# Patient Record
Sex: Female | Born: 1955 | Race: Black or African American | Hispanic: No | Smoking: Current every day smoker
Health system: Southern US, Community
[De-identification: ages and names within clinical notes are randomized; demographics above are authoritative.]

## PROBLEM LIST (undated history)

## (undated) DIAGNOSIS — J45909 Unspecified asthma, uncomplicated: Secondary | ICD-10-CM

## (undated) DIAGNOSIS — F17200 Nicotine dependence, unspecified, uncomplicated: Secondary | ICD-10-CM

## (undated) DIAGNOSIS — D219 Benign neoplasm of connective and other soft tissue, unspecified: Secondary | ICD-10-CM

## (undated) DIAGNOSIS — Z923 Personal history of irradiation: Secondary | ICD-10-CM

## (undated) DIAGNOSIS — J449 Chronic obstructive pulmonary disease, unspecified: Secondary | ICD-10-CM

## (undated) DIAGNOSIS — E119 Type 2 diabetes mellitus without complications: Secondary | ICD-10-CM

## (undated) DIAGNOSIS — I10 Essential (primary) hypertension: Secondary | ICD-10-CM

## (undated) HISTORY — PX: TUBAL LIGATION: SHX77

---

## 2008-10-08 ENCOUNTER — Emergency Department: Payer: Self-pay | Admitting: Emergency Medicine

## 2012-01-03 ENCOUNTER — Emergency Department: Payer: Self-pay | Admitting: Internal Medicine

## 2012-01-03 LAB — CBC WITH DIFFERENTIAL/PLATELET
Basophil #: 0.1 10*3/uL (ref 0.0–0.1)
Basophil %: 1.2 %
Eosinophil %: 1.2 %
HCT: 40.7 % (ref 35.0–47.0)
HGB: 13.4 g/dL (ref 12.0–16.0)
Lymphocyte #: 2.9 10*3/uL (ref 1.0–3.6)
Lymphocyte %: 25.1 %
MCV: 91 fL (ref 80–100)
Monocyte %: 7.1 %
Neutrophil #: 7.5 10*3/uL — ABNORMAL HIGH (ref 1.4–6.5)
RDW: 14.2 % (ref 11.5–14.5)
WBC: 11.5 10*3/uL — ABNORMAL HIGH (ref 3.6–11.0)

## 2012-01-03 LAB — COMPREHENSIVE METABOLIC PANEL
Alkaline Phosphatase: 113 U/L (ref 50–136)
Anion Gap: 6 — ABNORMAL LOW (ref 7–16)
Bilirubin,Total: 0.5 mg/dL (ref 0.2–1.0)
Calcium, Total: 8.4 mg/dL — ABNORMAL LOW (ref 8.5–10.1)
Chloride: 104 mmol/L (ref 98–107)
Co2: 31 mmol/L (ref 21–32)
Creatinine: 0.94 mg/dL (ref 0.60–1.30)
EGFR (African American): >60
Glucose: 92 mg/dL (ref 65–99)
Osmolality: 281 (ref 275–301)
Potassium: 3 mmol/L — ABNORMAL LOW (ref 3.5–5.1)
Sodium: 141 mmol/L (ref 136–145)
Total Protein: 7.7 g/dL (ref 6.4–8.2)

## 2012-01-03 LAB — CK TOTAL AND CKMB (NOT AT ARMC): CK, Total: 766 U/L — ABNORMAL HIGH (ref 21–215)

## 2012-01-03 LAB — TROPONIN I: Troponin-I: <0.02 ng/mL

## 2012-01-04 LAB — TROPONIN I: Troponin-I: <0.02 ng/mL

## 2012-01-04 LAB — CK TOTAL AND CKMB (NOT AT ARMC): CK, Total: 779 U/L — ABNORMAL HIGH (ref 21–215)

## 2012-12-12 ENCOUNTER — Encounter (HOSPITAL_COMMUNITY): Payer: Self-pay | Admitting: Emergency Medicine

## 2012-12-12 ENCOUNTER — Emergency Department (HOSPITAL_COMMUNITY): Payer: Self-pay

## 2012-12-12 ENCOUNTER — Emergency Department (HOSPITAL_COMMUNITY)
Admission: EM | Admit: 2012-12-12 | Discharge: 2012-12-13 | Disposition: A | Discharge status: Home / Self Care | Payer: Self-pay | Attending: Emergency Medicine | Admitting: Emergency Medicine

## 2012-12-12 DIAGNOSIS — N390 Urinary tract infection, site not specified: Secondary | ICD-10-CM | POA: Insufficient documentation

## 2012-12-12 DIAGNOSIS — R61 Generalized hyperhidrosis: Secondary | ICD-10-CM | POA: Insufficient documentation

## 2012-12-12 DIAGNOSIS — M549 Dorsalgia, unspecified: Secondary | ICD-10-CM | POA: Insufficient documentation

## 2012-12-12 DIAGNOSIS — F17200 Nicotine dependence, unspecified, uncomplicated: Secondary | ICD-10-CM | POA: Insufficient documentation

## 2012-12-12 DIAGNOSIS — R109 Unspecified abdominal pain: Secondary | ICD-10-CM | POA: Insufficient documentation

## 2012-12-12 LAB — URINALYSIS, ROUTINE W REFLEX MICROSCOPIC
Glucose, UA: NEGATIVE mg/dL
Hgb urine dipstick: NEGATIVE
Protein, ur: NEGATIVE mg/dL
Urobilinogen, UA: 0.2 mg/dL (ref 0.0–1.0)
pH: 5 (ref 5.0–8.0)

## 2012-12-12 LAB — BASIC METABOLIC PANEL
BUN: 14 mg/dL (ref 6–23)
Calcium: 9.9 mg/dL (ref 8.4–10.5)
Chloride: 100 mEq/L (ref 96–112)
Creatinine, Ser: 0.8 mg/dL (ref 0.50–1.10)
GFR calc Af Amer: >90 mL/min (ref >90)
Potassium: 4.6 mEq/L (ref 3.5–5.1)

## 2012-12-12 LAB — CBC
HCT: 47.5 % — ABNORMAL HIGH (ref 36.0–46.0)
Hemoglobin: 15.8 g/dL — ABNORMAL HIGH (ref 12.0–15.0)
MCH: 31.7 pg (ref 26.0–34.0)
MCHC: 33.3 g/dL (ref 30.0–36.0)
MCV: 95.4 fL (ref 78.0–100.0)
RBC: 4.98 MIL/uL (ref 3.87–5.11)
RDW: 14.2 % (ref 11.5–15.5)
WBC: 8.7 10*3/uL (ref 4.0–10.5)

## 2012-12-12 LAB — URINE MICROSCOPIC-ADD ON

## 2012-12-12 LAB — POCT I-STAT TROPONIN I: Troponin i, poc: 0 ng/mL (ref 0.00–0.08)

## 2012-12-12 MED ORDER — MORPHINE SULFATE 4 MG/ML IJ SOLN
4.0000 mg | Freq: Once | INTRAMUSCULAR | Status: AC
  Administered 2012-12-12: 4 mg via INTRAVENOUS
  Filled 2012-12-12: qty 1

## 2012-12-12 MED ORDER — IOHEXOL 300 MG/ML  SOLN: 100.0000 mL | Freq: Once | INTRAMUSCULAR | Status: AC | PRN

## 2012-12-12 MED ORDER — SODIUM CHLORIDE 0.9 % IV SOLN
INTRAVENOUS | Status: DC
  Administered 2012-12-12: 22:00:00 via INTRAVENOUS

## 2012-12-12 MED ORDER — IOHEXOL 300 MG/ML  SOLN
25.0000 mL | INTRAMUSCULAR | Status: AC
  Administered 2012-12-12: 25 mL via ORAL

## 2012-12-12 MED ORDER — ONDANSETRON HCL 4 MG/2ML IJ SOLN
4.0000 mg | Freq: Once | INTRAMUSCULAR | Status: AC
  Administered 2012-12-12: 4 mg via INTRAVENOUS
  Filled 2012-12-12: qty 2

## 2012-12-12 NOTE — ED Notes (Signed)
C/o severe lower back pain since last night that now radiates up back and around to chest.  Pt diaphoretic.  Denies sob.  C/o nausea and a bad taste in her mouth.  Denies urinary complaints.

## 2012-12-12 NOTE — ED Provider Notes (Signed)
CSN: [id number]     Arrival date & time 12/12/12  1800 History   First MD Initiated Contact with Patient 12/12/12 2035     Chief Complaint  Patient presents with  . Back Pain  . Nausea  . diaphoretic    (Consider location/radiation/quality/duration/timing/severity/associated sxs/prior Treatment) Patient is a 57 y.o. female presenting with back pain. The history is provided by the patient.  Back Pain Associated symptoms: abdominal pain   Associated symptoms: no chest pain, no headaches, no numbness and no weakness    patient has had pain in her bilateral flanks and right abdomen for the last few days. It started in her lower back at work his way up and around on the right side. It is less severe on the left side and goes left superior. She's had nausea and some diaphoresis. No vomiting. She's had some urinary frequency. She states it feels like when she has had a urinary tract infection previously. No diarrhea or constipation. No chills. She's had a decreased appetite.  History reviewed. No pertinent past medical history. Past Surgical History  Procedure Laterality Date  . Tubal ligation     No family history on file. History  Substance Use Topics  . Smoking status: Current Every Day Smoker  . Smokeless tobacco: Not on file  . Alcohol Use: No   OB History   Grav Para Term Preterm Abortions TAB SAB Ect Mult Living                 Review of Systems  Constitutional: Positive for appetite change. Negative for activity change.  Eyes: Negative for pain.  Respiratory: Negative for chest tightness and shortness of breath.   Cardiovascular: Negative for chest pain and leg swelling.  Gastrointestinal: Positive for nausea and abdominal pain. Negative for vomiting and diarrhea.  Genitourinary: Positive for flank pain.  Musculoskeletal: Positive for back pain. Negative for neck stiffness.  Skin: Negative for rash.  Neurological: Negative for weakness, numbness and headaches.   Psychiatric/Behavioral: Negative for behavioral problems.    Allergies  Ibuprofen  Home Medications   Current Outpatient Rx  Name  Route  Sig  Dispense  Refill  . Doxylamine Succinate, Sleep, (SLEEP AID PO)   Oral   Take 1 tablet by mouth at bedtime as needed (for sleep).         Marland Kitchen guaiFENesin (ROBITUSSIN) 100 MG/5ML liquid   Oral   Take 200 mg by mouth 3 (three) times daily as needed for cough.         . hydrocortisone cream 1 %   Topical   Apply 1 application topically daily as needed (for rash).          BP 123/56  Pulse 81  Temp(Src) 97.8 F (36.6 C) (Oral)  Resp 20  SpO2 97% Physical Exam  Nursing note and vitals reviewed. Constitutional: She is oriented to person, place, and time. She appears well-developed and well-nourished.  Patient is obese  HENT:  Head: Normocephalic and atraumatic.  Eyes: EOM are normal. Pupils are equal, round, and reactive to light.  Neck: Normal range of motion. Neck supple.  Cardiovascular: Normal rate, regular rhythm and normal heart sounds.   No murmur heard. Pulmonary/Chest: Effort normal and breath sounds normal. No respiratory distress. She has no wheezes. She has no rales.  Abdominal: Soft. Bowel sounds are normal. She exhibits no distension. There is tenderness. There is no rebound and no guarding.  Moderate tenderness to right abdomen. No masses palpated  Genitourinary:  CVA tenderness on right and some lower back tenderness on left side.  Musculoskeletal: Normal range of motion.  Neurological: She is alert and oriented to person, place, and time. No cranial nerve deficit.  Skin: Skin is warm and dry.  Psychiatric: She has a normal mood and affect. Her speech is normal.    ED Course  Procedures (including critical care time) Labs Review Labs Reviewed  CBC - Abnormal; Notable for the following:    Hemoglobin 15.8 (*)    HCT 47.5 (*)    All other components within normal limits  BASIC METABOLIC PANEL - Abnormal;  Notable for the following:    GFR calc non Af Amer 81 (*)    All other components within normal limits  URINALYSIS, ROUTINE W REFLEX MICROSCOPIC - Abnormal; Notable for the following:    APPearance CLOUDY (*)    Leukocytes, UA LARGE (*)    All other components within normal limits  URINE MICROSCOPIC-ADD ON - Abnormal; Notable for the following:    Squamous Epithelial / LPF MANY (*)    Bacteria, UA MANY (*)    All other components within normal limits  URINE CULTURE  POCT I-STAT TROPONIN I   Imaging Review Dg Chest 2 View  12/12/2012   CLINICAL DATA:  Chest pain and shortness of breath with cough for 2 weeks.  EXAM: CHEST  2 VIEW  COMPARISON:  None.  FINDINGS: Lungs are adequately inflated without focal consolidation or effusion. Cardiomediastinal silhouette is within normal. There is subtle biphasic curvature curvature of the spine.  IMPRESSION: No acute cardiopulmonary disease.   Electronically Signed   By: Elberta Fortis M.D.   On: 12/12/2012 19:40    EKG Interpretation   None       MDM  No diagnosis found. Patient with back pain. Pain goes around to right abdomen. Patient has apparent UTI, however there is more tenderness on the abdomen. CT scan is pending at this time.    Juliet Rude. Rubin Payor, MD 12/12/12 2350

## 2012-12-12 NOTE — ED Notes (Signed)
[place], MD at bedside.  

## 2012-12-13 MED ORDER — CEPHALEXIN 500 MG PO CAPS: 500.0000 mg | ORAL_CAPSULE | Freq: Four times a day (QID) | ORAL | Status: DC

## 2012-12-13 MED ORDER — OXYCODONE-ACETAMINOPHEN 5-325 MG PO TABS: 1.0000 | ORAL_TABLET | ORAL | Status: DC | PRN

## 2012-12-13 MED ORDER — METOCLOPRAMIDE HCL 10 MG PO TABS: 10.0000 mg | ORAL_TABLET | Freq: Four times a day (QID) | ORAL | Status: DC | PRN

## 2012-12-13 MED ORDER — IOHEXOL 300 MG/ML  SOLN
100.0000 mL | Freq: Once | INTRAMUSCULAR | Status: AC | PRN
  Administered 2012-12-13: 100 mL via INTRAVENOUS

## 2012-12-13 MED ORDER — MORPHINE SULFATE 4 MG/ML IJ SOLN
4.0000 mg | Freq: Once | INTRAMUSCULAR | Status: AC
  Administered 2012-12-13: 4 mg via INTRAVENOUS
  Filled 2012-12-13: qty 1

## 2012-12-13 MED ORDER — DEXTROSE 5 % IV SOLN
1.0000 g | Freq: Once | INTRAVENOUS | Status: AC
  Administered 2012-12-13: 1 g via INTRAVENOUS
  Filled 2012-12-13: qty 10

## 2012-12-13 NOTE — ED Provider Notes (Signed)
CT has come back unremarkable. She's given a dose of ceftriaxone for her urinary tract infection and is discharged with prescriptions for cephalexin, clopamide, and oxycodone-acetaminophen.  Results for orders placed during the hospital encounter of 12/12/12  CBC      Result Value Range   WBC 8.7  4.0 - 10.5 K/uL   RBC 4.98  3.87 - 5.11 MIL/uL   Hemoglobin 15.8 (*) 12.0 - 15.0 g/dL   HCT 78.2 (*) 95.6 - 21.3 %   MCV 95.4  78.0 - 100.0 fL   MCH 31.7  26.0 - 34.0 pg   MCHC 33.3  30.0 - 36.0 g/dL   RDW 08.6  57.8 - 46.9 %   Platelets 226  150 - 400 K/uL  BASIC METABOLIC PANEL      Result Value Range   Sodium 141  135 - 145 mEq/L   Potassium 4.6  3.5 - 5.1 mEq/L   Chloride 100  96 - 112 mEq/L   CO2 31  19 - 32 mEq/L   Glucose, Bld 98  70 - 99 mg/dL   BUN 14  6 - 23 mg/dL   Creatinine, Ser 6.29  0.50 - 1.10 mg/dL   Calcium 9.9  8.4 - 52.8 mg/dL   GFR calc non Af Amer 81 (*) >90 mL/min   GFR calc Af Amer >90  >90 mL/min  URINALYSIS, ROUTINE W REFLEX MICROSCOPIC      Result Value Range   Color, Urine YELLOW  YELLOW   APPearance CLOUDY (*) CLEAR   Specific Gravity, Urine 1.028  1.005 - 1.030   pH 5.0  5.0 - 8.0   Glucose, UA NEGATIVE  NEGATIVE mg/dL   Hgb urine dipstick NEGATIVE  NEGATIVE   Bilirubin Urine NEGATIVE  NEGATIVE   Ketones, ur NEGATIVE  NEGATIVE mg/dL   Protein, ur NEGATIVE  NEGATIVE mg/dL   Urobilinogen, UA 0.2  0.0 - 1.0 mg/dL   Nitrite NEGATIVE  NEGATIVE   Leukocytes, UA LARGE (*) NEGATIVE  URINE MICROSCOPIC-ADD ON      Result Value Range   Squamous Epithelial / LPF MANY (*) RARE   WBC, UA 21-50  <3 WBC/hpf   RBC / HPF 0-2  <3 RBC/hpf   Bacteria, UA MANY (*) RARE   Urine-Other MUCOUS PRESENT    POCT I-STAT TROPONIN I      Result Value Range   Troponin i, poc 0.00  0.00 - 0.08 ng/mL   Comment 3            Dg Chest 2 View  12/12/2012   CLINICAL DATA:  Chest pain and shortness of breath with cough for 2 weeks.  EXAM: CHEST  2 VIEW  COMPARISON:  None.   FINDINGS: Lungs are adequately inflated without focal consolidation or effusion. Cardiomediastinal silhouette is within normal. There is subtle biphasic curvature curvature of the spine.  IMPRESSION: No acute cardiopulmonary disease.   Electronically Signed   By: Elberta Fortis M.D.   On: 12/12/2012 19:40   Ct Abdomen Pelvis W Contrast  12/13/2012   CLINICAL DATA:  Low back pain since last night. Diaphoresis. Nausea. Right-sided pain.  EXAM: CT ABDOMEN AND PELVIS WITH CONTRAST  TECHNIQUE: Multidetector CT imaging of the abdomen and pelvis was performed using the standard protocol following bolus administration of intravenous contrast.  CONTRAST:  OMNIPAQUE IOHEXOL 300 MG/ML  SOLN  COMPARISON:  05/14/2012  FINDINGS: Lower Chest: Motion degradation. Normal heart size without pericardial or pleural effusion. Grossly clear lungs.  Abdomen/Pelvis:  Hepatomegaly, 19.9 cm craniocaudal. Mild hepatic steatosis. Prominence of the caudate lobe. Normal spleen, stomach, pancreas, gallbladder, biliary tract, adrenal glands, kidneys. Aortic and branch vessel atherosclerosis. No retroperitoneal or retrocrural adenopathy. Normal colon, appendix, and terminal ileum. Normal small bowel without abdominal ascites.  No pelvic adenopathy. Normal urinary bladder. Fibroid uterus. The largest exophytic mass off the lower uterine segment measures 5.6 x 4.6 cm on image 76/series 2. No adnexal mass or significant free fluid. .  Bones/Musculoskeletal:  No acute osseous abnormality.  IMPRESSION: 1. Hepatomegaly and hepatic steatosis. Mild caudate lobe prominence, without specific evidence of cirrhosis. Correlate with risk factors for liver disease. 2. Otherwise, no explanation for low back pain. 3. Uterine fibroids.   Electronically Signed   By: Jeronimo Greaves M.D.   On: 12/13/2012 00:40      Dione Booze, MD 12/13/12 [phone]

## 2012-12-14 LAB — URINE CULTURE: Culture: NO GROWTH

## 2013-08-21 ENCOUNTER — Encounter (HOSPITAL_COMMUNITY): Payer: Self-pay | Admitting: Emergency Medicine

## 2013-08-21 ENCOUNTER — Emergency Department (HOSPITAL_COMMUNITY)
Admission: EM | Admit: 2013-08-21 | Discharge: 2013-08-21 | Disposition: A | Discharge status: Home / Self Care | Payer: Self-pay | Attending: Emergency Medicine | Admitting: Emergency Medicine

## 2013-08-21 ENCOUNTER — Emergency Department (HOSPITAL_COMMUNITY): Payer: Self-pay

## 2013-08-21 DIAGNOSIS — F17200 Nicotine dependence, unspecified, uncomplicated: Secondary | ICD-10-CM | POA: Insufficient documentation

## 2013-08-21 DIAGNOSIS — R3589 Other polyuria: Secondary | ICD-10-CM | POA: Insufficient documentation

## 2013-08-21 DIAGNOSIS — Z9851 Tubal ligation status: Secondary | ICD-10-CM | POA: Insufficient documentation

## 2013-08-21 DIAGNOSIS — R197 Diarrhea, unspecified: Secondary | ICD-10-CM | POA: Insufficient documentation

## 2013-08-21 DIAGNOSIS — R358 Other polyuria: Secondary | ICD-10-CM | POA: Insufficient documentation

## 2013-08-21 DIAGNOSIS — R0602 Shortness of breath: Secondary | ICD-10-CM | POA: Insufficient documentation

## 2013-08-21 DIAGNOSIS — R110 Nausea: Secondary | ICD-10-CM | POA: Insufficient documentation

## 2013-08-21 DIAGNOSIS — Z8541 Personal history of malignant neoplasm of cervix uteri: Secondary | ICD-10-CM | POA: Insufficient documentation

## 2013-08-21 DIAGNOSIS — R1084 Generalized abdominal pain: Secondary | ICD-10-CM | POA: Insufficient documentation

## 2013-08-21 HISTORY — DX: Benign neoplasm of connective and other soft tissue, unspecified: D21.9

## 2013-08-21 LAB — I-STAT TROPONIN, ED: Troponin i, poc: 0 ng/mL (ref 0.00–0.08)

## 2013-08-21 LAB — COMPREHENSIVE METABOLIC PANEL
ALBUMIN: 3.8 g/dL (ref 3.5–5.2)
ALK PHOS: 85 U/L (ref 39–117)
ALT: 19 U/L (ref 0–35)
AST: 18 U/L (ref 0–37)
Anion gap: 15 (ref 5–15)
BUN: 18 mg/dL (ref 6–23)
CO2: 27 mEq/L (ref 19–32)
Calcium: 9.3 mg/dL (ref 8.4–10.5)
Chloride: 100 mEq/L (ref 96–112)
Creatinine, Ser: 0.81 mg/dL (ref 0.50–1.10)
GFR calc non Af Amer: 80 mL/min — ABNORMAL LOW (ref >90)
GLUCOSE: 119 mg/dL — AB (ref 70–99)
POTASSIUM: 3.9 meq/L (ref 3.7–5.3)
Sodium: 142 mEq/L (ref 137–147)
TOTAL PROTEIN: 7.5 g/dL (ref 6.0–8.3)
Total Bilirubin: 0.5 mg/dL (ref 0.3–1.2)

## 2013-08-21 LAB — CBC WITH DIFFERENTIAL/PLATELET
Basophils Absolute: 0.1 10*3/uL (ref 0.0–0.1)
Basophils Relative: 1 % (ref 0–1)
EOS ABS: 0.1 10*3/uL (ref 0.0–0.7)
Eosinophils Relative: 2 % (ref 0–5)
HEMATOCRIT: 45.3 % (ref 36.0–46.0)
HEMOGLOBIN: 14.7 g/dL (ref 12.0–15.0)
Lymphocytes Relative: 42 % (ref 12–46)
Lymphs Abs: 2.8 10*3/uL (ref 0.7–4.0)
MCH: 30.6 pg (ref 26.0–34.0)
MCHC: 32.5 g/dL (ref 30.0–36.0)
MCV: 94.4 fL (ref 78.0–100.0)
MONO ABS: 0.4 10*3/uL (ref 0.1–1.0)
MONOS PCT: 6 % (ref 3–12)
Neutro Abs: 3.4 10*3/uL (ref 1.7–7.7)
Neutrophils Relative %: 49 % (ref 43–77)
Platelets: 228 10*3/uL (ref 150–400)
RBC: 4.8 MIL/uL (ref 3.87–5.11)
RDW: 14.4 % (ref 11.5–15.5)
WBC: 6.7 10*3/uL (ref 4.0–10.5)

## 2013-08-21 LAB — URINALYSIS, ROUTINE W REFLEX MICROSCOPIC
BILIRUBIN URINE: NEGATIVE
GLUCOSE, UA: NEGATIVE mg/dL
HGB URINE DIPSTICK: NEGATIVE
Ketones, ur: 15 mg/dL — AB
Nitrite: NEGATIVE
PROTEIN: NEGATIVE mg/dL
Specific Gravity, Urine: 1.027 (ref 1.005–1.030)
Urobilinogen, UA: 1 mg/dL (ref 0.0–1.0)
pH: 5.5 (ref 5.0–8.0)

## 2013-08-21 LAB — URINE MICROSCOPIC-ADD ON

## 2013-08-21 LAB — LIPASE, BLOOD: Lipase: 26 U/L (ref 11–59)

## 2013-08-21 LAB — PRO B NATRIURETIC PEPTIDE: Pro B Natriuretic peptide (BNP): 23.4 pg/mL (ref 0–125)

## 2013-08-21 MED ORDER — SODIUM CHLORIDE 0.9 % IV SOLN
Freq: Once | INTRAVENOUS | Status: AC
  Administered 2013-08-21: 21:00:00 via INTRAVENOUS

## 2013-08-21 MED ORDER — ONDANSETRON 4 MG PO TBDP
8.0000 mg | ORAL_TABLET | Freq: Once | ORAL | Status: AC
  Administered 2013-08-21: 8 mg via ORAL
  Filled 2013-08-21: qty 2

## 2013-08-21 MED ORDER — HYDROMORPHONE HCL PF 1 MG/ML IJ SOLN
1.0000 mg | Freq: Once | INTRAMUSCULAR | Status: AC
  Administered 2013-08-21: 1 mg via INTRAVENOUS
  Filled 2013-08-21: qty 1

## 2013-08-21 NOTE — ED Notes (Signed)
Pt is here with abdominal pain and abdominal numbness, RUQ pain.  Pt states chest pain and sob.

## 2013-08-21 NOTE — ED Notes (Signed)
Pt reports she is feeling nauseated and hot.

## 2013-08-21 NOTE — ED Notes (Signed)
Pt placed on 2L via [place]; oxygen sats on RA at 82%; pt at 97% on 2L 

## 2013-08-21 NOTE — ED Notes (Signed)
Radiology called to transport pt to room.

## 2013-08-21 NOTE — ED Provider Notes (Signed)
CSN: [id number]     Arrival date & time 08/21/13  1812 History   First MD Initiated Contact with Patient 08/21/13 2027     Chief Complaint  Patient presents with  . Chest Pain  . Abdominal Pain     (Consider location/radiation/quality/duration/timing/severity/associated sxs/prior Treatment) Patient is a 58 y.o. female presenting with chest pain and abdominal pain.  Chest Pain Associated symptoms: abdominal pain, nausea and shortness of breath   Associated symptoms: no diaphoresis, no fever and not vomiting   Abdominal Pain Associated symptoms: chest pain, diarrhea, nausea and shortness of breath   Associated symptoms: no chills, no constipation, no dysuria, no fever and no vomiting     Caitlin Rivera is a 58 year old woman with history of uterine fibroids who presents with 2 weeks of abdominal pain. She says that the throbbing pain originates suprapubic and radiates up both sides of her abdomen and to her back bilaterally. She thinks the part that is most painful shifts daily and today is RUQ. She thinks that resting makes it better and is not sure what else makes it better or worse. She has some nausea but no emesis. She had diarrhea x 3 days earlier this week. She reports her last cocaine use was 3 weeks ago. She notes polyuria but denies dysuria. She has no PCP.  Past Medical History  Diagnosis Date  . Fibroids    Past Surgical History  Procedure Laterality Date  . Tubal ligation    . Tubal ligation     No family history on file. History  Substance Use Topics  . Smoking status: Current Every Day Smoker  . Smokeless tobacco: Not on file  . Alcohol Use: No   OB History   Grav Para Term Preterm Abortions TAB SAB Ect Mult Living                 Review of Systems  Constitutional: Negative for fever, chills, diaphoresis and unexpected weight change.  Respiratory: Positive for shortness of breath.   Cardiovascular: Positive for chest pain.  Gastrointestinal: Positive for  nausea, abdominal pain and diarrhea. Negative for vomiting, constipation and blood in stool.  Endocrine: Positive for polyuria.  Genitourinary: Negative for dysuria.      Allergies  Ibuprofen  Home Medications   Prior to Admission medications   Medication Sig Start Date End Date Taking? Authorizing Provider  OVER THE COUNTER MEDICATION Take 1-2 tablets by mouth 2 (two) times daily as needed (for pain). OTC Pain Reliever PM   Yes Historical Provider, MD   BP 119/70  Pulse 71  Temp(Src) 98.1 F (36.7 C) (Oral)  Resp 10  SpO2 97% Physical Exam  Constitutional: She is oriented to person, place, and time. She appears well-developed and well-nourished. She appears distressed.  Restless shuffling in bed, cannot get comfortable  HENT:  Mouth/Throat: Oropharynx is clear and moist.  Eyes: EOM are normal. Pupils are equal, round, and reactive to light. No scleral icterus.  Cardiovascular: Normal rate, regular rhythm, normal heart sounds and intact distal pulses.  Exam reveals no gallop and no friction rub.   No murmur heard. Pulmonary/Chest: Effort normal and breath sounds normal. No respiratory distress.  Abdominal: Soft. Bowel sounds are normal. She exhibits no distension. There is tenderness.  Diffuse tenderness in all quadrants and suprapubic  Musculoskeletal: She exhibits no edema and no tenderness.  Neurological: She is alert and oriented to person, place, and time.  Skin: She is not diaphoretic.    ED  Course  Procedures (including critical care time) Labs Review Labs Reviewed  COMPREHENSIVE METABOLIC PANEL - Abnormal; Notable for the following:    Glucose, Bld 119 (*)    GFR calc non Af Amer 80 (*)    All other components within normal limits  URINALYSIS, ROUTINE W REFLEX MICROSCOPIC - Abnormal; Notable for the following:    APPearance CLOUDY (*)    Ketones, ur 15 (*)    Leukocytes, UA SMALL (*)    All other components within normal limits  URINE MICROSCOPIC-ADD ON -  Abnormal; Notable for the following:    Squamous Epithelial / LPF FEW (*)    Bacteria, UA FEW (*)    Casts HYALINE CASTS (*)    Crystals CA OXALATE CRYSTALS (*)    All other components within normal limits  PRO B NATRIURETIC PEPTIDE  CBC WITH DIFFERENTIAL  LIPASE, BLOOD  URINE RAPID DRUG SCREEN (HOSP PERFORMED)  I-STAT TROPOININ, ED    Imaging Review Dg Chest 2 View  08/21/2013   CLINICAL DATA:  Chest pain and numbness in the epigastric region.  EXAM: CHEST  2 VIEW  COMPARISON:  Chest x-ray 12/12/2012.  FINDINGS: Mild diffuse peribronchial cuffing, similar to the prior examination. Lung volumes are normal. No consolidative airspace disease. No pleural effusions. No pneumothorax. No pulmonary nodule or mass noted. Pulmonary vasculature and the cardiomediastinal silhouette are within normal limits.  IMPRESSION: 1. Mild diffuse peribronchial cuffing similar to the prior study. In this patient with history of smoking, this may reflect chronic bronchitis.   Electronically Signed   By: Vinnie Langton M.D.   On: 08/21/2013 19:51   Ct Renal Stone Study  08/21/2013   CLINICAL DATA:  Abdominal pain and numbness. Chest pain and shortness of breath.  EXAM: CT RENAL STONE PROTOCOL  TECHNIQUE: Multidetector CT imaging of the abdomen and pelvis was performed following the standard protocol without intravenous contrast  COMPARISON:  CT of the abdomen and pelvis performed 12/12/2012  FINDINGS: The visualized lung bases are clear. A single tiny bleb is noted at the right lung base.  There is mild diffuse fatty infiltration within the liver, with mild sparing about the gallbladder fossa. The spleen is unremarkable in appearance. The gallbladder is within normal limits. The pancreas and adrenal glands are unremarkable.  The kidneys are unremarkable in appearance. There is no evidence of hydronephrosis. No renal or ureteral stones are seen. No perinephric stranding is appreciated.  No free fluid is identified. The  small bowel is unremarkable in appearance. The stomach is within normal limits. No acute vascular abnormalities are seen. Mild scattered calcification is seen along the abdominal aorta and its branches.  The appendix is normal in caliber and contains air, without evidence for appendicitis. The colon is unremarkable in appearance.  The bladder is mildly distended and grossly unremarkable. Three prominent exophytic fibroids arising from the inferior aspect of the uterus are grossly stable in appearance, the largest of which measures 5.6 x 4.6 cm in size. The ovaries are relatively symmetric. No suspicious adnexal masses are seen. No inguinal lymphadenopathy is seen.  No acute osseous abnormalities are identified.  IMPRESSION: 1. No evidence of hydronephrosis.  No renal or ureteral stone seen. 2. The appendix is unremarkable in appearance; no evidence for appendicitis. 3. Stable appearance to three prominent exophytic fibroids arising from the uterus. No evidence for degeneration or torsion. 4. Mild diffuse fatty infiltration within the liver. 5. Mild scattered calcification along the abdominal aorta and its branches.   Electronically  Signed   By: Garald Balding M.D.   On: 08/21/2013 22:25     EKG Interpretation   Date/Time:  Wednesday August 21 2013 18:16:34 EDT Ventricular Rate:  76 PR Interval:  152 QRS Duration: 80 QT Interval:  372 QTC Calculation: 418 R Axis:   53 Text Interpretation:  Normal sinus rhythm T wave abnormality, consider  inferolateral ischemia Abnormal ECG Confirmed by BEATON  MD, ROBERT  ([id number]) on 08/21/2013 9:42:29 PM      MDM   Final diagnoses:  None    9:07PM: Patient has calcium oxalate crystals and restless pain on exam which is consistent with nephrolithiasis, although she had no hematuria on UA. She also has history of exophytic uterine fibroids which may cause torsion. Last reported smoked cocaine 3 weeks ago. Troponin negative but new t-wave inversions concerning  for inferolateral ischemia.  proBNP CBC, CMP, lipase wnl. Will get CT abd pelvis wo contrast, dilaudid 1 mg iv, and NS infusion @ 75 cc/hr and call cardiology consult  10:11PM: Patient chart reviewed by Dr. Murvin Natal of cardiology. He feels the changes are due to LVH and given patient history and negative troponin, not concerning or requiring admission for cardiology purposes. Awaiting CT.  10:40PM: CT negative for nephrolithiasis and has stable fibroids. Discussed results with patient and possibility this is related to fibroids. We will provide list of possible PCPs for her to establish care. We also shared that [facility] is an option for fibroid assessment and possible need for hysterectomy.  Caitlin Aline, MD 08/21/13 [phone]

## 2013-08-21 NOTE — ED Notes (Signed)
PT monitored by pulse ox, bp cuff, and 5-lead. 

## 2013-08-21 NOTE — Discharge Instructions (Signed)
You were seen in the ED today for abdominal and chest pain. You had a CT which showed no changes from last year's imaging. We discussed that you should have your fibroids evaluated as an outpatient or at [facility]. Please seek medical attention or return to the ED if you have any new or worsening abdominal pain, chest pain, shortness of breath or any other worrisome condition. You should take over the counter tylenol 650 mg every six hours for your pain if needed.   Emergency Department Resource Guide 1) Find a Doctor and Pay Out of Pocket Although you won't have to find out who is covered by your insurance plan, it is a good idea to ask around and get recommendations. You will then need to call the office and see if the doctor you have chosen will accept you as a new patient and what types of options they offer for patients who are self-pay. Some doctors offer discounts or will set up payment plans for their patients who do not have insurance, but you will need to ask so you aren't surprised when you get to your appointment.  2) Contact Your Local Health Department Not all health departments have doctors that can see patients for sick visits, but many do, so it is worth a call to see if yours does. If you don't know where your local health department is, you can check in your phone book. The CDC also has a tool to help you locate your state's health department, and many state websites also have listings of all of their local health departments.  3) Find a [facility] If your illness is not likely to be very severe or complicated, you may want to try a walk in clinic. These are popping up all over the country in pharmacies, drugstores, and shopping centers. They're usually staffed by nurse practitioners or physician assistants that have been trained to treat common illnesses and complaints. They're usually fairly quick and inexpensive. However, if you have serious medical issues or chronic  medical problems, these are probably not your best option.  No Primary Care Doctor: - Call Health Connect at  [phone] - they can help you locate a primary care doctor that  accepts your insurance, provides certain services, etc. - Physician Referral Service- [phone]  Chronic Pain Problems: Organization         Address  Phone   Notes  [facility]  [phone] Patients need to be referred by their primary care doctor.   Medication Assistance: Organization         Address  Phone   Notes  [facility] Medication [facility] [place], [place] [address] [phone] --Must be a resident of [facility] -- Must have NO insurance coverage whatsoever (no Medicaid/ Medicare, etc.) -- The pt. MUST have a primary care doctor that directs their care regularly and follows them in the community   Med[facility]ist  [phone]   [employer]  [phone]    Agencies that provide inexpensive medical care: Organization         Address  Phone   Notes  [place]  [phone]   [facility]    [phone]   [facility] [place], [place] [address] [phone]   [place] [address], [place] [phone]   Planned Parenthood    [phone]  [facility]    [phone]   [facility]  [address], [place] Phone:  [phone], Fax:  [phone] Hours of Operation:  9 am - 6 pm, M-F.  Also accepts Medicaid/Medicare and self-pay.  [facility] [place] [address], [address], [place] Phone: [phone], Fax: [phone]. Hours of Operation:  8:30 am - 5:30 pm, M-F.  Also accepts Medicaid and self-pay.  [facility] [place] [address], [place] Phone: [phone]   [place], [place], [place] [phone],  Ext. [phone] Mondays & Thursdays: 7-9 AM.  First 15 patients are seen on a first come, first serve basis.    [place] Providers:  Organization         Address  Phone   Notes  [facility] [address], [place] [phone] Also accepts self-pay patients.  [facility] [address], [place]  [phone]   [place], [address], [place] [phone]   [facility] [address] [place] [phone]   Lucianne Lei [address], [place]   [phone] Only accepts [place] Access [place] patients after they have their name applied to their card.   Self-Pay (no insurance) in [facility]:  Organization         Address  Phone   Notes  Sickle Cell Patients, [facility] [place] [phone]   [facility] [place] [phone]   [facility] [place]  [place], [place], [place] [phone]   [facility]/Dr. Osei-Bonsu  [address], [place] [phone] Phone number for both [place] and [place] locations is the same.  [facility] [address], [place] [phone]   [facility] [address] [place] or [address] [phone]   [facility] [address] [place] [phone], phone; [phone], fax Sees patients 1st and 3rd Saturday of every month.  Must not qualify for public or private insurance (i.e. Medicaid, Medicare, [place] Health Choice, Veterans' Benefits)  Household income should be no more than 200% of the poverty level The clinic cannot treat you if you are pregnant or think you are pregnant  Sexually transmitted diseases are not  treated at the clinic.    Dental Care: Organization         Address  Phone  Notes  [facility] Department of [facility] [phone] Accepts children up to age 45 who are enrolled in [place] or [place]; pregnant women with a Medicaid card; and children who have applied for Medicaid or [place] Health Choice, but were declined, whose parents can pay a reduced fee at time of service.  [facility] of [facility]  [address], [place] [phone] Accepts children up to age 59 who are enrolled in [place] or [place]; pregnant women with a Medicaid card; and children who have applied for Medicaid or [place] Health Choice, but were declined, whose parents can pay a reduced fee at time  of service.  °[facility] ° [address], [place] [phone] Patients are seen by appointment only. Walk-ins are not accepted. [facility] will see patients 18 years of age and older. °Monday - Tuesday (8am-5pm) °Most Wednesdays (8:30-5pm) °$30 per visit, cash only  °[facility] ° [address], [place] [phone] Patients are seen by appointment only. Walk-ins are not accepted. [facility] will see patients 18 years of age and older. °One Wednesday Evening (Monthly: Volunteer Based).  $30 per visit, cash only  °[facility]  [phone] for adults; Children under age 4, call Graduate Pediatric Dentistry at [phone]. Children aged 4-14, please call [phone] to request a pediatric application. ° Dental services are provided in all areas of dental care including fillings, crowns and bridges, complete and partial dentures, implants, gum treatment, root canals, and extractions. Preventive care is also provided. Treatment is provided to both adults and children. °Patients are selected via a lottery and there is  often a waiting list. °  °[facility] [address], °[place] ° ([phone] [email or web address].[email or web address] °  °[facility] [address], [place], [place] ([phone], Ext. 123 Second and Fourth Thursday of each month, opens at 6:30 AM; Clinic ends at 9 AM.  Patients are seen on a first-come first-served basis, and a limited number are seen during each clinic.  ° °[facility] ° [address], [place], [place] [phone]   Eligibility Requirements °You must have lived in [place], [place], or [place] counties for at least the last three months. °  You cannot be eligible for state or federal sponsored healthcare insurance, including Veterans Administration, Medicaid, or Medicare. °  You generally cannot be eligible for healthcare insurance through your employer.  °  How to apply: °Eligibility screenings are held every Tuesday and Wednesday afternoon from 1:00 pm until 4:00 pm. You do not need an appointment for the interview!  °[facility] [address], [place], [place] [phone]   °[facility]  [phone]   °[facility]  [phone]   °[facility]  336-570-6415   ° °Behavioral Health Resources in the Community: °Intensive Outpatient Programs °Organization         Address  Phone  Notes  °[facility] [address], [place], [place] [phone]   °[facility] [address], [place], [place] [phone]   °[facility] [address], [place], [place] ° [phone]   °[facility] [address],  °[place], [place] 1-800-853-5163 or 336-641-4981   °Substance Abuse Resources °Organization         Address  Phone  Notes  °[facility]  336-882-2125   °[facility]  336-784-9470   °[facility]  336-285-9073   °[facility]  336-845-3988   °Residential & Outpatient Substance  Abuse Program  1-800-659-3381   °Psychological Services °Organization         Address  Phone  Notes  °[facility]  336- [phone]   °[facility]  336- [phone]   °[facility] [address], [place] 1-800-853-5163 or 336-641-4981   ° °Mobile Crisis Teams °Organization         Address  Phone  Notes  °[facility], Mobile Crisis Care Unit  1-877-626-1772   °Assertive °Psychotherapeutic   Services  [address] [place], [place]   [facility] [address], [place] [phone]    Self-Help/Support Groups Organization         Address  Phone             Notes  [place]. of [place] - variety of support groups  [place] Call for more information  Narcotics Anonymous (NA), [facility] [address], [employer] [place]  2 meetings at this location   Special educational needs teacher         Address  Phone  Notes  [facility] [place],    [place]  1-6361518779   [facility]  [address] [place] [address], [place], [place]   [place], [place] [phone] Admissions: 8am-3pm M-F  Incentives Substance [place] [address]    [place], [place] [phone]   [facility] [address] [place], [place], [place]   [facility] [address],  [place], [place]   [facility] - Intensive Outpatient [address], [place], [place]   [facility] ([place].,  [place], [place] 1-(302)789-2861 or [phone]   [facility] ([facility]) [address] [place], [place] Accepts Medicaid  [facility] [address],  [place] 1-7401063853 Substance Abuse/Addiction Treatment   [facility] Organization          Address  Phone  Notes  [facility]  6098293892   Domenic Schwab, PhD [address] [place], [place]   [phone] or [phone]   [place], [place] [phone]   [facility] [address], [place], [place] [phone] Insurance/Medicaid/sponsorship through [facility] [address] [place]                                    [place], [place] [phone] [place] [address], [place] [phone]    Dr. Adele Schilder  [phone]   [facility] Dept. 1) [address] [place] 2) [place] 3)  [place] [address], [place] [phone]  [phone]   [place] [phone] or [phone] (After Hours)

## 2013-08-22 LAB — RAPID URINE DRUG SCREEN, HOSP PERFORMED
Amphetamines: NOT DETECTED
Barbiturates: NOT DETECTED
Benzodiazepines: NOT DETECTED
COCAINE: NOT DETECTED
OPIATES: NOT DETECTED
Tetrahydrocannabinol: NOT DETECTED

## 2013-08-26 NOTE — ED Provider Notes (Signed)
I saw and evaluated the patient, reviewed the resident's note and I agree with the findings and plan.   .Face to face Exam:  General:  Awake HEENT:  Atraumatic Resp:  Normal effort Abd:  Nondistended Neuro:No focal weakness  EKG was discussed and reviewed with resident  Dot Lanes, MD 08/26/13 [phone]

## 2014-03-13 ENCOUNTER — Encounter (HOSPITAL_COMMUNITY): Payer: Self-pay | Admitting: Emergency Medicine

## 2014-03-13 ENCOUNTER — Emergency Department (HOSPITAL_COMMUNITY)
Admission: EM | Admit: 2014-03-13 | Discharge: 2014-03-13 | Disposition: A | Discharge status: Home / Self Care | Payer: Self-pay | Attending: Emergency Medicine | Admitting: Emergency Medicine

## 2014-03-13 DIAGNOSIS — R05 Cough: Secondary | ICD-10-CM | POA: Insufficient documentation

## 2014-03-13 DIAGNOSIS — Z720 Tobacco use: Secondary | ICD-10-CM | POA: Insufficient documentation

## 2014-03-13 DIAGNOSIS — H6501 Acute serous otitis media, right ear: Secondary | ICD-10-CM | POA: Insufficient documentation

## 2014-03-13 DIAGNOSIS — Z8742 Personal history of other diseases of the female genital tract: Secondary | ICD-10-CM | POA: Insufficient documentation

## 2014-03-13 DIAGNOSIS — J029 Acute pharyngitis, unspecified: Secondary | ICD-10-CM | POA: Insufficient documentation

## 2014-03-13 MED ORDER — AMOXICILLIN 500 MG PO CAPS: 500.0000 mg | ORAL_CAPSULE | Freq: Three times a day (TID) | ORAL | Status: DC

## 2014-03-13 MED ORDER — TRAMADOL HCL 50 MG PO TABS: 50.0000 mg | ORAL_TABLET | Freq: Four times a day (QID) | ORAL | Status: DC | PRN

## 2014-03-13 NOTE — Discharge Instructions (Signed)

## 2014-03-13 NOTE — ED Notes (Signed)
Patient states started having ear pain about a week ago.  Patient complains of R ear pain.  Patient states her throat started hurting after that.    Patient denies other symptoms.   Patient states didn't take anything at home.

## 2014-03-13 NOTE — ED Provider Notes (Signed)
CSN: [id number]     Arrival date & time 03/13/14  1059 History  This chart was scribed for Glendell Docker, NP with Maudry Diego, MD by Edison Simon, ED Scribe. This patient was seen in room TR06C/TR06C and the patient's care was started at 11:09 AM.    No chief complaint on file.  Otalgia  There is pain in the right ear. This is a new problem. The current episode started 1 to 4 weeks ago. The problem occurs constantly. The problem has been gradually worsening. There has been no fever. The pain is mild. Associated symptoms include coughing, hearing loss and a sore throat. Pertinent negatives include no ear discharge. She has tried nothing for the symptoms. There is no history of a chronic ear infection or a tympanostomy tube.    HPI Comments: Caitlin Rivera is a 59 y.o. female who presents to the Emergency Department complaining of right ear pain and decreased hearing  with onset 1 week ago. She reports associated cough and sore throat. She states she has not used an medication for it. She states she has not had similar symptoms before. She denies any health problems but states she does not see a doctor regularly. She denies drainage.   Past Medical History  Diagnosis Date  . Fibroids    Past Surgical History  Procedure Laterality Date  . Tubal ligation    . Tubal ligation     No family history on file. History  Substance Use Topics  . Smoking status: Current Every Day Smoker  . Smokeless tobacco: Not on file  . Alcohol Use: No   OB History    No data available     Review of Systems  HENT: Positive for ear pain, hearing loss and sore throat. Negative for ear discharge.   Respiratory: Positive for cough.   All other systems reviewed and are negative.     Allergies  Ibuprofen  Home Medications   Prior to Admission medications   Medication Sig Start Date End Date Taking? Authorizing Provider  OVER THE COUNTER MEDICATION Take 1-2 tablets by mouth 2 (two) times daily as  needed (for pain). OTC Pain Reliever PM    Historical Provider, MD   BP 144/76 mmHg  Pulse 78  Temp(Src) 97.9 F (36.6 C)  Resp 18  SpO2 97% Physical Exam  Constitutional: She is oriented to person, place, and time. She appears well-developed and well-nourished.  HENT:  Head: Normocephalic and atraumatic.  Right Ear: Tympanic membrane is erythematous and bulging.  Mouth/Throat: Posterior oropharyngeal erythema present.  Eyes: Conjunctivae are normal.  Neck: Normal range of motion. Neck supple.  Cardiovascular: Normal rate and regular rhythm.   Pulmonary/Chest: Effort normal.  Musculoskeletal: Normal range of motion.  Neurological: She is alert and oriented to person, place, and time.  Skin: Skin is warm and dry.  Psychiatric: She has a normal mood and affect.  Nursing note and vitals reviewed.   ED Course  Procedures (including critical care time)  DIAGNOSTIC STUDIES: Oxygen Saturation is 97% on room air, normal by my interpretation.    COORDINATION OF CARE: 11:11 AM Discussed with patient that her examination reveals evidence of ear infection. Discussed treatment plan with patient at beside, the patient agrees with the plan and has no further questions at this time.   Labs Review Labs Reviewed - No data to display  Imaging Review No results found.   EKG Interpretation None      MDM   Final diagnoses:  Right acute serous otitis media, recurrence not specified    Will treat with amox and ultram. Pt given return precautions.  I personally performed the services described in this documentation, which was scribed in my presence. The recorded information has been reviewed and is accurate.   Glendell Docker, NP 03/13/14 [place], MD 03/13/14 1242

## 2015-03-15 ENCOUNTER — Emergency Department (HOSPITAL_COMMUNITY)
Admission: EM | Admit: 2015-03-15 | Discharge: 2015-03-15 | Disposition: A | Discharge status: Home / Self Care | Payer: Self-pay | Attending: Emergency Medicine | Admitting: Emergency Medicine

## 2015-03-15 ENCOUNTER — Emergency Department (HOSPITAL_COMMUNITY): Payer: Self-pay

## 2015-03-15 ENCOUNTER — Encounter (HOSPITAL_COMMUNITY): Payer: Self-pay | Admitting: *Deleted

## 2015-03-15 DIAGNOSIS — K089 Disorder of teeth and supporting structures, unspecified: Secondary | ICD-10-CM

## 2015-03-15 DIAGNOSIS — Z792 Long term (current) use of antibiotics: Secondary | ICD-10-CM | POA: Insufficient documentation

## 2015-03-15 DIAGNOSIS — K0501 Acute gingivitis, non-plaque induced: Secondary | ICD-10-CM | POA: Insufficient documentation

## 2015-03-15 DIAGNOSIS — M5410 Radiculopathy, site unspecified: Secondary | ICD-10-CM

## 2015-03-15 DIAGNOSIS — M5416 Radiculopathy, lumbar region: Secondary | ICD-10-CM | POA: Insufficient documentation

## 2015-03-15 DIAGNOSIS — K0500 Acute gingivitis, plaque induced: Secondary | ICD-10-CM

## 2015-03-15 DIAGNOSIS — F17210 Nicotine dependence, cigarettes, uncomplicated: Secondary | ICD-10-CM | POA: Insufficient documentation

## 2015-03-15 DIAGNOSIS — R200 Anesthesia of skin: Secondary | ICD-10-CM | POA: Insufficient documentation

## 2015-03-15 DIAGNOSIS — Z86018 Personal history of other benign neoplasm: Secondary | ICD-10-CM | POA: Insufficient documentation

## 2015-03-15 LAB — BASIC METABOLIC PANEL
ANION GAP: 11 (ref 5–15)
BUN: 6 mg/dL (ref 6–20)
CALCIUM: 9.3 mg/dL (ref 8.9–10.3)
CO2: 28 mmol/L (ref 22–32)
Chloride: 100 mmol/L — ABNORMAL LOW (ref 101–111)
Creatinine, Ser: 0.72 mg/dL (ref 0.44–1.00)
GFR calc Af Amer: >60 mL/min (ref >60)
GLUCOSE: 122 mg/dL — AB (ref 65–99)
Potassium: 3.8 mmol/L (ref 3.5–5.1)
Sodium: 139 mmol/L (ref 135–145)

## 2015-03-15 LAB — BRAIN NATRIURETIC PEPTIDE: B Natriuretic Peptide: 53.5 pg/mL (ref 0.0–100.0)

## 2015-03-15 LAB — CBC
HEMATOCRIT: 46.5 % — AB (ref 36.0–46.0)
Hemoglobin: 15.4 g/dL — ABNORMAL HIGH (ref 12.0–15.0)
MCH: 31 pg (ref 26.0–34.0)
MCHC: 33.1 g/dL (ref 30.0–36.0)
MCV: 93.6 fL (ref 78.0–100.0)
Platelets: 216 10*3/uL (ref 150–400)
RBC: 4.97 MIL/uL (ref 3.87–5.11)
RDW: 14.2 % (ref 11.5–15.5)
WBC: 6.4 10*3/uL (ref 4.0–10.5)

## 2015-03-15 LAB — I-STAT TROPONIN, ED: Troponin i, poc: 0 ng/mL (ref 0.00–0.08)

## 2015-03-15 MED ORDER — ACETAMINOPHEN 325 MG PO TABS
325.0000 mg | ORAL_TABLET | Freq: Once | ORAL | Status: DC
  Filled 2015-03-15: qty 1

## 2015-03-15 MED ORDER — CHLORHEXIDINE GLUCONATE 0.12% ORAL RINSE (MEDLINE KIT): 15.0000 mL | Freq: Two times a day (BID) | OROMUCOSAL | Status: AC

## 2015-03-15 MED ORDER — PREDNISONE 10 MG PO TABS: 20.0000 mg | ORAL_TABLET | Freq: Two times a day (BID) | ORAL | Status: AC

## 2015-03-15 MED ORDER — ACETAMINOPHEN 325 MG PO TABS
650.0000 mg | ORAL_TABLET | Freq: Once | ORAL | Status: AC
  Administered 2015-03-15: 650 mg via ORAL

## 2015-03-15 MED ORDER — PENICILLIN V POTASSIUM 500 MG PO TABS: 500.0000 mg | ORAL_TABLET | Freq: Three times a day (TID) | ORAL | Status: AC

## 2015-03-15 NOTE — ED Notes (Signed)
Pt reports severe pain to lower gums x 1 week. Also reports intermittent left side numbness for months. No neuro deficits noted at triage.

## 2015-03-15 NOTE — ED Provider Notes (Signed)
  Face-to-face evaluation   History: She presents for evaluation of swollen bleeding gums. Symptom 1 week duration. No recent dental care. Also intermittent left-sided numbness from axilla to the left lower leg. Also, chronic lower back pain, and prior evaluation and treatment for "disc problems". No urinary or bowel symptoms.  Physical exam: Alert, obese female who is comfortable. Mouth with mild gingivitis, retraction of gums from teeth and poor dentition. Neck supple. Back nontender to palpation. Normal range of motion lumbar spine. Normal range of motion, arms and legs bilaterally.  Medical screening examination/treatment/procedure(s) were conducted as a shared visit with non-physician practitioner(s) and myself.  I personally evaluated the patient during the encounter  Daleen Bo, MD 03/15/15 1620

## 2015-03-15 NOTE — Discharge Instructions (Signed)
Medications: Penicillin V, Chlorhexidine oral rinse, Prednisone  Treatment: Take Penicillin three (3) times daily for 1 week for gingivitis. Rinse with Chlorhexidine oral rinse two (2) times daily for gingivitis. Brush teeth daily to twice daily. Begin to take measures to quit smoking, as this will improve your healing time, as well as shortness of breath. Take two (2) Prednisone tablets two (2) times daily with a meal for back pain and numbness. You can also take Tylenol to assist with pain relief. Do not exceed more than 4000mg  day of Tylenol. Use heat on back for additional pain relief.   Follow up: Please follow up with a dentist for further evaluation of gingival disease and dentition. Please follow up with a Primary Care Provider as soon as your are able for further evaluation of your back pain and health maintenance. Please return to the emergency department if you experience any fevers, inability to open mouth, facial swelling, or pus or excessive bleeding from your mouth. Also return if you experience any numbness of your groin area, you cannot control your bladder or bowels, or have any other concerning symptom.  Gingivitis Gingivitis is a form of gum (periodontal) disease that causes redness, soreness, and swelling (inflammation) of your gums. CAUSES The most common cause of gingivitis is poor oral hygiene. A sticky substance made of bacteria, mucus, and food particles (plaque), is deposited on the exposed part of teeth. As plaque builds up, it reacts with the saliva in your mouth to form something called  tartar. Tartar is a hard deposit that becomes trapped around the base of the tooth. Plaque and tartar irritate the gums, leading to the formation of gingivitis. Other factors that increase your risk for gingivitis include:   Tobacco use.  Diabetes.  Older age.  Certain medications.  Certain viral or fungal infections.  Dry mouth.  Hormonal changes such as during pregnancy.  Poor  nutrition.  Substance abuse.  Poor fitting dental restorations or appliances. SYMPTOMS You may notice inflammation of the soft tissue (gingiva) around the teeth. When these tissues become inflamed, they bleed easily, especially during flossing or brushing. The gums may also be:   Tender to the touch.  Bright red, purple red, or have a shiny appearance.  Swollen.  Wearing away from the teeth (receding), which exposes more of the tooth. Bad breath is often present. Continued infection around teeth can eventually cause cavities and loosen teeth. This may lead to eventual tooth loss. DIAGNOSIS A medical and dental history will be taken. Your mouth, teeth, and gums will be examined. Your dentist will look for soft, swollen purple-red, irritated gums. There may be deposits of plaque and tartar at the base of the teeth. Your gums will be looked at for the degree of redness, puffiness, and bleeding tendencies. Your dentist will see if any of the teeth are loose. X-rays may be taken to see if the inflammation has spread to the supporting structures of the teeth. TREATMENT The goal is to reduce and reverse the inflammation. Proper treatment can usually reverse the symptoms of gingivitis and prevent further progression of the disease. Have your teeth cleaned. During the cleaning, all plaque and tartar will be removed. Instruction for proper home care will be given. You will need regular professional cleanings and check-ups in the future. HOME CARE INSTRUCTIONS  Brush your teeth twice a day and floss at least once per day. When flossing, it is best to floss first then brush.  Limit sugar between meals and maintain  a well-balanced diet.  Even the best dental hygiene will not prevent plaque from developing. It is necessary for you to see your dentist on a regular basis for cleaning and regular checkups.  Your dentist can recommend proper oral hygiene and mouth care and suggest special toothpastes or  mouth rinses.  Stop smoking. SEEK DENTAL OR MEDICAL CARE IF:  You have painful, reddened tissue around your teeth, or you have puffy swollen gums.  You have difficulty chewing.  You notice any loose or infected teeth.  You have swollen glands.  Your gums bleed easily when you brush your teeth or are very tender to the touch.   This information is not intended to replace advice given to you by your health care provider. Make sure you discuss any questions you have with your health care provider.   Document Released: 06/22/2000 Document Revised: 03/21/2011 Document Reviewed: 08/11/2014 Elsevier Interactive Patient Education [address] is a great source of information about community services available.  Access by dialing 2-1-1 from anywhere in [place], or by website -  CustodianSupply.fi.   Other Local Resources (Updated 01/2015)  Dental  Care   Services    Phone Number and Address  Cost  [facility] For children 56 - 5 years of age:   Cleaning  Tooth brushing/flossing instruction  Sealants, fillings, crowns  Extractions  Emergency treatment  [phone] [address], [place] [address] Charges based on family income.  Medicaid and some insurance plans accepted.     [facility] - [facility], fillings, crowns  Extractions  Emergency treatment [phone], [place]  Pregnant women 51 years of age or older with a Medicaid card  [facility] [place]  Cleaning  Sealants, fillings, crowns  Extractions  Emergency treatment [phone] [address] [place], [place] Pregnant women 19 years of age or older with a Medicaid card  [facility] For children 35 - 2 years of age:    Cleaning  Tooth brushing/flossing instruction  Sealants, fillings, crowns  Extractions  Emergency treatment Limited orthodontic services for patients with Medicaid [phone] [address], [place] [address] Medicaid and [facility] Health Choice cover for children up to age 55 and pregnant women.  Parents of children up to age 35 without Medicaid pay a reduced fee at time of service.  [facility] For children 31 - 37 years of age:   Cleaning  Tooth brushing/flossing instruction  Sealants, fillings, crowns  Extractions  Emergency treatment Limited orthodontic services for patients with Medicaid [phone] [place], [place].  Medicaid and [employer] Health Choice cover for children up to age 38 and pregnant women.  Parents of children up to age 81 without Medicaid pay a reduced fee.  [facility]  Sealants, fillings, crowns  Extractions  Hours: Tuesdays and Thursdays, 4:15 - 8 pm [phone] [address] [place], [place] [address] Services free of charge to [facility] residents ages 18-64 who do not have health insurance, Medicare, [place], or [place] benefits and fall within federal poverty guidelines  [place] care in addition to primary medical care, nutritional counseling, and pharmacy:  Engineer, drilling,  fillings, crowns  Extractions                  [phone] [facility], [place], [place], [place], [place], [place] [facility], [place], [place] [facility], [place], [place], most insurance.   Also provides services available to all with fees adjusted based on ability to pay.    [facility]  Cleaning  Tooth brushing/flossing instruction  Sealants, fillings, crowns  Extractions  Emergency treatment Hours: Tuesdays, Thursdays, and Fridays from 8 am to 5 pm by appointment only. [phone] [address], [place] [address] [facility] residents with Medicaid (depending on eligibility) and children with [facility] Health Choice - call for more information.  [facility]  Extractions only  Hours: 2nd and 4th Thursday of each month from 6:30 am - 9 am.   [phone] ext. [address] [place], [place] [address] Ages 60 and older only.  Patients are seen on a first come, first served basis.  [employer]  [employer]  Extractions  Orthodontics  Endodontics  Implants/Crowns/Bridges  Complete and partial dentures [phone] [place],  Patients must complete an application for services.  There is often a waiting list.    Radicular Pain Radicular pain in either the arm or leg is usually from a bulging or herniated disk in the spine. A piece of the herniated disk may press against the nerves as the nerves exit the spine. This causes pain which is felt at the tips of the nerves down the arm or leg. Other causes of radicular pain may include:  Fractures.  Heart disease.  Cancer.  An abnormal and usually degenerative state of the nervous system or nerves (neuropathy). Diagnosis may require CT or MRI scanning to determine the primary cause.  Nerves that start at the neck (nerve roots) may cause radicular pain in the outer shoulder and arm. It can spread down to the thumb and fingers. The symptoms vary depending on which nerve root has been affected. In most cases radicular pain improves with conservative treatment. Neck problems may require physical therapy, a neck collar, or cervical traction.  Treatment may take many weeks, and surgery may be considered if the symptoms do not improve.  Conservative treatment is also recommended for sciatica. Sciatica causes pain to radiate from the lower back or buttock area down the leg into the foot. Often there is a history of back problems. Most patients with sciatica are better after 2 to 4 weeks of rest and other supportive care. Short term bed rest can reduce the disk pressure considerably. Sitting, however, is not a good position since this increases the pressure on the disk. You should avoid bending, lifting, and all other activities which make the problem worse. Traction can be used in severe cases. Surgery is usually reserved for patients who do not improve within the first months of treatment. Only take over-the-counter or prescription medicines for pain, discomfort, or fever as directed by your caregiver. Narcotics and muscle relaxants may help by relieving more severe pain and spasm and by providing mild sedation. Cold or massage can give significant relief. Spinal manipulation is not recommended. It can increase the degree of disc protrusion. Epidural steroid injections are often effective  treatment for radicular pain. These injections deliver medicine to the spinal nerve in the space between the protective covering of the spinal cord and back bones (vertebrae). Your caregiver can give you more information about steroid injections. These injections are most effective when given within two weeks of the onset of pain.  You should see your caregiver for follow up care as recommended. A program for neck and back injury rehabilitation with stretching and strengthening exercises is an important part of management.  SEEK IMMEDIATE MEDICAL CARE IF:  You develop increased pain, weakness, or numbness in your arm or leg.  You develop difficulty with bladder or bowel control.  You develop abdominal pain.   This information is not intended to replace advice  given to you by your health care provider. Make sure you discuss any questions you have with your health care provider.   Document Released: 02/04/2004 Document Revised: 01/17/2014 Document Reviewed: 07/23/2014 Elsevier Interactive Patient Education [employer].

## 2015-03-15 NOTE — ED Provider Notes (Signed)
CSN: [id number]     Arrival date & time 03/15/15  58 History   First MD Initiated Contact with Patient 03/15/15 1134     Chief Complaint  Patient presents with  . Dental Problem  . Numbness     (Consider location/radiation/quality/duration/timing/severity/associated sxs/prior Treatment) HPI Comments: Caitlin Rivera is a 60yo AAF who presents today with dental/gum pain x1week, left side numbness xmonths, and chronic back pain. The gum pain began a week ago and has worsened. She has not been able to eat very much because of it and it hurts to open her mouth wide. She has had an associated temporal headache. She feels that her tongue is swollen and that it is hard to swallow. She has tried salt water gargles without alleviation or resolution. The patient brushes her teeth once every few weeks and her gums bleed at this time. She does not remember the last time she saw a dentist or PCP.  She also reports a cough for the past week.  Patient denies any fever, sore throat, or abdominal pain.  Patient also reports left sided numbness for the past few months that comes intermittently. She occasionally feels numb along her left torso and on her left thigh. She states it feels completely numb at times, and like pins and needles at other times. It has occurred when she has been sitting, laying down, and walking. She has not felt any numbness anywhere else, including her face. She also experiences chronic left sided back pain. She was seen by a chiropractor many years ago and was diagnosed with degenerative disc disease. No saddle anesthesia or loss of bowel or bladder control.  The patient reported shortness of breath at rest, on exertion, and PND. She also reported occasional chest pain when laying down that is resolved a few minutes after taking 4-5 Tums. She describes it as an achy pain and is associated with cold sweat at times. The last episode occurred 2 weeks ago.   The history is provided by the patient.  No language interpreter was used.    Past Medical History  Diagnosis Date  . Fibroids    Past Surgical History  Procedure Laterality Date  . Tubal ligation    . Tubal ligation     History reviewed. No pertinent family history. Social History  Substance Use Topics  . Smoking status: Current Every Day Smoker -- 0.50 packs/day    Types: Cigarettes  . Smokeless tobacco: None  . Alcohol Use: No   OB History    No data available     Review of Systems  Constitutional: Negative for fever.  HENT: Positive for dental problem and trouble swallowing.   Respiratory: Positive for cough.   Cardiovascular: Negative for leg swelling.  Gastrointestinal: Negative for nausea, vomiting, abdominal pain, diarrhea and constipation.  Genitourinary: Negative for dysuria.  Musculoskeletal: Positive for back pain.  Skin: Negative for rash and wound.  Neurological: Positive for numbness and headaches.  Psychiatric/Behavioral: The patient is not nervous/anxious.       Allergies  Ibuprofen  Home Medications   Prior to Admission medications   Medication Sig Start Date End Date Taking? Authorizing Provider  amoxicillin (AMOXIL) 500 MG capsule Take 1 capsule (500 mg total) by mouth 3 (three) times daily. 03/13/14   Glendell Docker, NP  chlorhexidine gluconate (PERIDEX) 0.12 % solution Use as directed 15 mLs in the mouth or throat 2 (two) times daily. 03/15/15 03/20/15  Frederica Kuster, PA-C  OVER THE COUNTER MEDICATION Take  1-2 tablets by mouth 2 (two) times daily as needed (for pain). OTC Pain Reliever PM    Historical Provider, MD  penicillin v potassium (VEETID) 500 MG tablet Take 1 tablet (500 mg total) by mouth 3 (three) times daily. 03/15/15 03/22/15  Frederica Kuster, PA-C  predniSONE (DELTASONE) 10 MG tablet Take 2 tablets (20 mg total) by mouth 2 (two) times daily with a meal. 03/15/15 03/20/15  Bea Graff Jeshua Ransford, PA-C  traMADol (ULTRAM) 50 MG tablet Take 1 tablet (50 mg total) by mouth every 6 (six)  hours as needed. 03/13/14   Glendell Docker, NP   BP 114/70 mmHg  Pulse 78  Temp(Src) 97.9 F (36.6 C) (Oral)  Resp 185  Wt 123.605 kg  SpO2 98% Physical Exam  Constitutional: She appears well-developed and well-nourished. No distress.  HENT:  Head: Normocephalic and atraumatic.  Mouth/Throat: Uvula is midline and oropharynx is clear and moist. Mucous membranes are not pale and not dry. No trismus in the jaw. No oropharyngeal exudate, posterior oropharyngeal edema, posterior oropharyngeal erythema or tonsillar abscesses.    Patient exhibits generalized gingivitis, more inflammation across the gums of her lower incisers. Tongue large, but not obstructive to the airway.  Eyes: Conjunctivae are normal. Pupils are equal, round, and reactive to light. Right eye exhibits no discharge. Left eye exhibits no discharge. No scleral icterus.  Neck: Normal range of motion.  Cardiovascular: Normal rate, regular rhythm and normal heart sounds.  Exam reveals no gallop.   No murmur heard. Pulmonary/Chest: Effort normal. No respiratory distress. She has no wheezes. She has no rales.  Abdominal: Soft. Bowel sounds are normal. There is no tenderness. There is no rebound and no guarding.  Musculoskeletal: She exhibits no edema.       Arms: Lymphadenopathy:    She has no cervical adenopathy.  Neurological: She is alert. She has normal strength. She is not disoriented. No cranial nerve deficit or sensory deficit. Coordination and gait normal.  Reflex Scores:      Patellar reflexes are 2+ on the right side and 2+ on the left side.      Achilles reflexes are 2+ on the right side and 2+ on the left side. No clonus  Skin: Skin is warm and dry. No rash noted. She is not diaphoretic. No pallor.  Psychiatric: She has a normal mood and affect.  Nursing note and vitals reviewed.   ED Course  Procedures (including critical care time) Labs Review Labs Reviewed  CBC - Abnormal; Notable for the following:     Hemoglobin 15.4 (*)    HCT 46.5 (*)    All other components within normal limits  BASIC METABOLIC PANEL - Abnormal; Notable for the following:    Chloride 100 (*)    Glucose, Bld 122 (*)    All other components within normal limits  BRAIN NATRIURETIC PEPTIDE  I-STAT TROPOININ, ED    Imaging Review Dg Chest 2 View  03/15/2015  CLINICAL DATA:  SOB and chest pain at times; intermittent left side numbness for months; States she has arrythmia; No respiratory problems; Is not on meds for HTN; EXAM: CHEST  2 VIEW COMPARISON:  08/21/2013 FINDINGS: Midline trachea. Lateral view degraded by patient arm position. Normal heart size and mediastinal contours. No pleural effusion or pneumothorax. Diffuse peribronchial thickening. Clear lungs. IMPRESSION: 1.  No acute cardiopulmonary disease. 2. Mild peribronchial thickening which may relate to chronic bronchitis or smoking. Electronically Signed   By: Abigail Miyamoto M.D.   On:  03/15/2015 13:55   I have personally reviewed and evaluated these images and lab results as part of my medical decision-making.   EKG Interpretation   Date/Time:  Sunday March 15 2015 13:13:09 EST Ventricular Rate:  69 PR Interval:  163 QRS Duration: 74 QT Interval:  395 QTC Calculation: 423 R Axis:   64 Text Interpretation:  Sinus rhythm Consider anterior infarct since last  tracing no significant change Confirmed by WENTZ  MD, ELLIOTT [id number]) on  03/15/2015 1:36:57 PM      MDM   60yo F  Presents with 1 week hx of dental pain, chronic back pain and associated numbness. Patient also reported occasional chest pain and shortness of breath on exertion and paroxysmal nocturnal dyspnea. Troponin 0.0.  BNP 53.5. No acute cardiopulmonmary disease on CXR. EKG unremarkable. Generalized gingivitis and poor dentition on exam. No trismus or visible abscess in mouth. No neuro deficits on exam, no saddle anesthesia or bowel or bladder incontinence. Patient given Pen V for gingival infection  as well as chlorhexidine rinse. Prednisone given for back inflammation and possible radiculopathy. Recommend urgent visit to dentist. Patient provided with free dental resources. Recommend visit and further evaluation of back pain and health maintenence with PCP. Return precautions were discussed with the patient and included in discharge instructions. Patient understands and agrees with the plan.   Final diagnoses:  Gingivitis, acute  Back pain with left-sided radiculopathy  Poor dentition      Frederica Kuster, PA-C 03/15/15 [place], MD 03/15/15 1620

## 2018-09-21 ENCOUNTER — Other Ambulatory Visit: Payer: Self-pay

## 2018-09-21 ENCOUNTER — Emergency Department (HOSPITAL_COMMUNITY)
Admission: EM | Admit: 2018-09-21 | Discharge: 2018-09-21 | Disposition: A | Discharge status: Home / Self Care | Payer: Medicaid - Out of State | Attending: Emergency Medicine | Admitting: Emergency Medicine

## 2018-09-21 DIAGNOSIS — K047 Periapical abscess without sinus: Secondary | ICD-10-CM | POA: Diagnosis not present

## 2018-09-21 DIAGNOSIS — F17210 Nicotine dependence, cigarettes, uncomplicated: Secondary | ICD-10-CM | POA: Diagnosis not present

## 2018-09-21 DIAGNOSIS — K0889 Other specified disorders of teeth and supporting structures: Secondary | ICD-10-CM | POA: Diagnosis present

## 2018-09-21 MED ORDER — PENICILLIN V POTASSIUM 250 MG PO TABS
500.0000 mg | ORAL_TABLET | Freq: Once | ORAL | Status: AC
  Administered 2018-09-21: 500 mg via ORAL
  Filled 2018-09-21: qty 2

## 2018-09-21 MED ORDER — HYDROCODONE-ACETAMINOPHEN 5-325 MG PO TABS
1.0000 | ORAL_TABLET | Freq: Once | ORAL | Status: AC
  Administered 2018-09-21: 23:00:00 1 via ORAL
  Filled 2018-09-21: qty 1

## 2018-09-21 MED ORDER — HYDROCODONE-ACETAMINOPHEN 5-325 MG PO TABS: 1.0000 | ORAL_TABLET | ORAL | 0 refills | Status: DC | PRN

## 2018-09-21 MED ORDER — PENICILLIN V POTASSIUM 500 MG PO TABS: 500.0000 mg | ORAL_TABLET | Freq: Four times a day (QID) | ORAL | 0 refills | Status: AC

## 2018-09-21 NOTE — ED Triage Notes (Signed)
Pt POV d/t L.sided dental pain w/swollen L.jaw. for x1wk.

## 2018-09-21 NOTE — ED Provider Notes (Signed)
[facility] EMERGENCY DEPARTMENT Provider Note   CSN: [id number] Arrival date & time: 09/21/18  1851     History   Chief Complaint Chief Complaint  Patient presents with  . Oral Swelling  . Headache    HPI Caitlin Rivera is a 63 y.o. female.     Patient to ED with painful left sided facial swelling and redness that started about one week ago. No fever. She reports dental pain and having multiple loose teeth, 2 of which she has pulled out herself this past week. No sore throat, fever, cough, SoB, headache other than facial pain, eye pain.  The history is provided by the patient. No language interpreter was used.  Headache Associated symptoms: no abdominal pain, no eye pain, no fever, no nausea, no neck pain, no neck stiffness and no sore throat     Past Medical History:  Diagnosis Date  . Fibroids     There are no active problems to display for this patient.   Past Surgical History:  Procedure Laterality Date  . TUBAL LIGATION    . TUBAL LIGATION       OB History   No obstetric history on file.      Home Medications    Prior to Admission medications   Medication Sig Start Date End Date Taking? Authorizing Provider  amoxicillin (AMOXIL) 500 MG capsule Take 1 capsule (500 mg total) by mouth 3 (three) times daily. 03/13/14   Glendell Docker, NP  OVER THE COUNTER MEDICATION Take 1-2 tablets by mouth 2 (two) times daily as needed (for pain). OTC Pain Reliever PM    [provider]  traMADol (ULTRAM) 50 MG tablet Take 1 tablet (50 mg total) by mouth every 6 (six) hours as needed. 03/13/14   Glendell Docker, NP    Family History No family history on file.  Social History Social History   Tobacco Use  . Smoking status: Current Every Day Smoker    Packs/day: 0.50    Types: Cigarettes  Substance Use Topics  . Alcohol use: No  . Drug use: Yes    Comment: crack     Allergies   Morphine and related and Ibuprofen   Review of  Systems Review of Systems  Constitutional: Negative for fever.  HENT: Positive for dental problem and facial swelling. Negative for sore throat and trouble swallowing.   Eyes: Negative for pain.  Respiratory: Negative for shortness of breath.   Gastrointestinal: Negative for abdominal pain and nausea.  Musculoskeletal: Negative for neck pain and neck stiffness.  Neurological: Negative for headaches.     Physical Exam Updated Vital Signs BP 131/80 (BP Location: Right Arm)   Pulse 78   Temp 98.4 F (36.9 C) (Oral)   Resp 17   Ht 5\' 7"  (1.702 m)   Wt 117.9 kg   SpO2 97%   BMI 40.72 kg/m   Physical Exam Vitals signs and nursing note reviewed.  Constitutional:      Appearance: She is well-developed.  HENT:     Mouth/Throat:     Comments: Patient has significant widespread dental decay. No visualized abscess or drainage. Oropharynx benign. No submental adenopathy. There is maxillary facial swelling with erythema but no induration or discrete abscess.  Neck:     Musculoskeletal: Normal range of motion.  Pulmonary:     Effort: Pulmonary effort is normal.  Skin:    General: Skin is warm and dry.  Neurological:     Mental Status: She  is alert and oriented to person, place, and time.      ED Treatments / Results  Labs (all labs ordered are listed, but only abnormal results are displayed) Labs Reviewed - No data to display  EKG None  Radiology No results found.  Procedures Procedures (including critical care time)  Medications Ordered in ED Medications - No data to display   Initial Impression / Assessment and Plan / ED Course  I have reviewed the triage vital signs and the nursing notes.  Pertinent labs & imaging results that were available during my care of the patient were reviewed by me and considered in my medical decision making (see chart for details).        Patient to ED with dental pain, facial swelling and redness c/w apical abscess. VSS,  afebrile. Oropharynx benign.   Will start on PCN. She has an allergy to NSAIDs. Will provide limited # Norco for pain relief. Will provide dental referral.   Final Clinical Impressions(s) / ED Diagnoses   Final diagnoses:  None   1. Dental abscess   ED Discharge Orders    None       Dennie Bible 09/21/18 2224    Virgel Manifold, MD 09/23/18 1416

## 2018-09-21 NOTE — ED Notes (Signed)
Patient verbalizes understanding of discharge instructions. Opportunity for questioning and answers were provided. Armband removed by staff, pt discharged from ED.  

## 2018-09-21 NOTE — Discharge Instructions (Signed)
Use your mouth rinse as prescribed.   Take penicillin 4 times daily, and Norco as needed for intense pain. Follow up with a dental clinic of your choice.   If you develop a high fever, difficulty swallowing or breathing, severe pain - please return to the emergency department.

## 2019-04-04 ENCOUNTER — Other Ambulatory Visit: Payer: Self-pay | Admitting: Internal Medicine

## 2019-04-04 ENCOUNTER — Other Ambulatory Visit: Payer: Self-pay | Admitting: *Deleted

## 2019-04-04 DIAGNOSIS — Z1231 Encounter for screening mammogram for malignant neoplasm of breast: Secondary | ICD-10-CM

## 2019-04-23 ENCOUNTER — Ambulatory Visit
Admission: RE | Admit: 2019-04-23 | Discharge: 2019-04-23 | Disposition: A | Discharge status: Home / Self Care | Payer: Medicaid Other | Source: Ambulatory Visit | Attending: Internal Medicine | Admitting: Internal Medicine

## 2019-04-23 ENCOUNTER — Other Ambulatory Visit: Payer: Self-pay

## 2019-04-23 DIAGNOSIS — Z1231 Encounter for screening mammogram for malignant neoplasm of breast: Secondary | ICD-10-CM

## 2019-04-24 ENCOUNTER — Other Ambulatory Visit: Payer: Self-pay | Admitting: Internal Medicine

## 2019-04-24 DIAGNOSIS — R928 Other abnormal and inconclusive findings on diagnostic imaging of breast: Secondary | ICD-10-CM

## 2019-04-29 ENCOUNTER — Other Ambulatory Visit: Payer: Self-pay | Admitting: Internal Medicine

## 2019-05-01 ENCOUNTER — Other Ambulatory Visit: Payer: Self-pay

## 2019-05-01 ENCOUNTER — Other Ambulatory Visit: Payer: Self-pay | Admitting: Internal Medicine

## 2019-05-01 ENCOUNTER — Ambulatory Visit
Admission: RE | Admit: 2019-05-01 | Discharge: 2019-05-01 | Disposition: A | Discharge status: Home / Self Care | Payer: Medicaid Other | Source: Ambulatory Visit | Attending: Internal Medicine | Admitting: Internal Medicine

## 2019-05-01 DIAGNOSIS — R928 Other abnormal and inconclusive findings on diagnostic imaging of breast: Secondary | ICD-10-CM

## 2019-05-13 ENCOUNTER — Inpatient Hospital Stay: Admission: RE | Admit: 2019-05-13 | Payer: Medicaid Other | Source: Ambulatory Visit

## 2019-06-03 ENCOUNTER — Ambulatory Visit
Admission: RE | Admit: 2019-06-03 | Discharge: 2019-06-03 | Disposition: A | Discharge status: Home / Self Care | Payer: Medicaid Other | Source: Ambulatory Visit | Attending: Internal Medicine | Admitting: Internal Medicine

## 2019-06-03 ENCOUNTER — Other Ambulatory Visit: Payer: Self-pay

## 2019-06-03 DIAGNOSIS — R928 Other abnormal and inconclusive findings on diagnostic imaging of breast: Secondary | ICD-10-CM

## 2019-06-03 HISTORY — PX: BREAST BIOPSY: SHX20

## 2019-06-04 ENCOUNTER — Other Ambulatory Visit: Payer: Self-pay | Admitting: Internal Medicine

## 2019-06-04 DIAGNOSIS — N6489 Other specified disorders of breast: Secondary | ICD-10-CM

## 2019-06-11 DIAGNOSIS — C801 Malignant (primary) neoplasm, unspecified: Secondary | ICD-10-CM

## 2019-06-11 HISTORY — DX: Malignant (primary) neoplasm, unspecified: C80.1

## 2019-06-14 ENCOUNTER — Other Ambulatory Visit: Payer: Self-pay

## 2019-06-14 ENCOUNTER — Ambulatory Visit
Admission: RE | Admit: 2019-06-14 | Discharge: 2019-06-14 | Disposition: A | Discharge status: Home / Self Care | Payer: Medicaid Other | Source: Ambulatory Visit | Attending: Internal Medicine | Admitting: Internal Medicine

## 2019-06-14 DIAGNOSIS — N6489 Other specified disorders of breast: Secondary | ICD-10-CM

## 2019-06-14 HISTORY — PX: BREAST BIOPSY: SHX20

## 2019-06-21 ENCOUNTER — Encounter: Payer: Self-pay | Admitting: Oncology

## 2019-06-21 ENCOUNTER — Telehealth: Payer: Self-pay | Admitting: Oncology

## 2019-06-21 NOTE — Telephone Encounter (Signed)
Received a new pt referral from Dr. Marlou Starks for new dx of breast cancer. Pt has been cld and scheduled to see Dr. Jana Hakim on 6/22 at 4pm w/labs at 330pm. Pt aware to arrive 15 minutes early. Letter mailed.

## 2019-06-21 NOTE — Telephone Encounter (Signed)
error 

## 2019-06-26 ENCOUNTER — Encounter: Payer: Self-pay | Admitting: Adult Health

## 2019-06-26 DIAGNOSIS — C50312 Malignant neoplasm of lower-inner quadrant of left female breast: Secondary | ICD-10-CM | POA: Insufficient documentation

## 2019-07-01 ENCOUNTER — Other Ambulatory Visit: Payer: Self-pay | Admitting: *Deleted

## 2019-07-01 DIAGNOSIS — C50312 Malignant neoplasm of lower-inner quadrant of left female breast: Secondary | ICD-10-CM

## 2019-07-01 NOTE — Progress Notes (Signed)
[place]  [phone] [phone]     ID: Caitlin Rivera DOB: 25-Jul-1955  MR#: [id number]  BOF#:[id number]  Patient Care Team: Caitlin Mccreedy, MD as PCP - General (Internal Medicine) Caitlin Rivera, Caitlin Dad, MD as Consulting Physician (Oncology) Caitlin Luna, MD as Consulting Physician (General Surgery) Chauncey Cruel, MD OTHER MD:  CHIEF COMPLAINT: Estrogen receptor positive noninvasive breast cancer  CURRENT TREATMENT: Awaiting definitive surgery   HISTORY OF CURRENT ILLNESS: "Caitlin Rivera" had routine screening mammography on 04/23/2019 showing a possible abnormality in the bilateral breasts. She underwent bilateral diagnostic mammography with tomography and bilateral breast ultrasonography at [facility] on 05/01/2019 showing: breast density category B; persistent lower-inner left breast asymmetry with no ultrasound correlate; superficial 0.7 cm right breast mass at 3 o'clock.  Accordingly on 06/03/2019 she proceeded to biopsy of the bilateral breast areas in question. The pathology from this procedure ([id number]) showed:  1. Left breast  - benign breast parenchyma 2. Right breast  - sclerosing papillary lesion   The left breast diagnosis was found to be discordant, and a repeat biopsy was recommended. This was performed on 06/14/2019, with pathology [id number]) showing: ductal carcinoma in situ with calcifications, intermediate grade, involving an underlying complex sclerosing lesion. Prognostic indicators significant for: estrogen receptor, 95% positive and progesterone receptor, 85% positive, both with strong staining intensity.  The patient's subsequent history is as detailed below.   INTERVAL HISTORY: Caitlin Rivera was evaluated in the breast cancer clinic on 07/02/2019. Her case was also presented at the multidisciplinary breast cancer conference 06/26/2019. At that time a preliminary plan was proposed: Because there are bilateral lesions 1 of  which would have to be done first to prove that there was no malignancy it was felt the patient was not an optimal candidate for COMET; breast conserving surgery with adjuvant radiation and consideration of antiestrogens was suggested   REVIEW OF SYSTEMS: There were no breast cancer specific symptoms leading to the original mammogram, which was routinely scheduled. The patient denies unusual headaches, visual changes, nausea, vomiting, stiff neck or dizziness.  She has chronic low back pain which she says has not been evaluated.  There has been no cough, phlegm production, or pleurisy, no chest pain or pressure, and no change in bowel or bladder habits. The patient denies fever, rash, bleeding, unexplained fatigue or unexplained weight loss.  She has received both doses of either the [place] or majority of vaccine.  A detailed review of systems was otherwise entirely negative.   PAST MEDICAL HISTORY: Past Medical History:  Diagnosis Date  . Fibroids   Hypertension hypercholesterolemia type 2 diabetes mellitus morbid obesity and possibly hepatitis.  PAST SURGICAL HISTORY: Past Surgical History:  Procedure Laterality Date  . TUBAL LIGATION    . TUBAL LIGATION    Possible appendectomy  FAMILY HISTORY: No family history on file.  The patient's father is 28 years old as of June 2021.  Of 11 siblings 1 sister had cancer of unknown type.  The patient's mother died from "stomach "cancer at the age of 39.  One maternal uncle had throat cancer.  The patient herself has a total of 8 siblings, none with cancer  GYNECOLOGIC HISTORY:  No LMP recorded. Patient is postmenopausal. Menarche: 64 years old Age at first live birth: 64 years old [place] P  3 LMP 70 Contraceptive yes, several years, without complications HRT no Hysterectomy?  No BSO?  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  She moved to this area from [place] to be closer to her  oldest daughter Caitlin Rivera.  Patient is a homemaker.  Daughter Caitlin Rivera works in a Materials engineer in [place].  Daughter Caitlin Rivera also lives in [place] but is disabled.  The patient has 9 grandchildren.  She has no great-grandchildren.  She attends a [employer].  ADVANCED DIRECTIVES:   HEALTH MAINTENANCE: Social History   Tobacco Use  . Smoking status: Current Every Day Smoker    Packs/day: 0.50    Types: Cigarettes  Substance Use Topics  . Alcohol use: No  . Drug use: Yes    Comment: crack     Colonoscopy: Never  PAP: Up-to-date  Bone density: never   Allergies  Allergen Reactions  . Morphine And Related Itching  . Ibuprofen Itching    Current Outpatient Medications  Medication Sig Dispense Refill  . albuterol (VENTOLIN HFA) 108 (90 Base) MCG/ACT inhaler     . amLODipine (NORVASC) 5 MG tablet Take 5 mg by mouth daily.    Marland Kitchen doxycycline (VIBRAMYCIN) 100 MG capsule Take 100 mg by mouth 2 (two) times daily.    Marland Kitchen gabapentin (NEURONTIN) 400 MG capsule Take 400 mg by mouth 3 (three) times daily.    . metFORMIN (GLUCOPHAGE) 500 MG tablet Take 500 mg by mouth 2 (two) times daily.    Marland Kitchen OVER THE COUNTER MEDICATION Take 1-2 tablets by mouth 2 (two) times daily as needed (for pain). OTC Pain Reliever PM    . rosuvastatin (CRESTOR) 20 MG tablet Take 20 mg by mouth daily.    . SYMBICORT 160-4.5 MCG/ACT inhaler     . tiZANidine (ZANAFLEX) 4 MG tablet Take 4 mg by mouth every 8 (eight) hours as needed.     No current facility-administered medications for this visit.    OBJECTIVE: African-American woman who appears stated age  64:   07/02/19 1600  BP: 133/70  Pulse: 79  Resp: 18  Temp: 98.5 F (36.9 C)  SpO2: 94%     Body mass index is 44.28 kg/m.   Wt Readings from Last 3 Encounters:  07/02/19 278 lb 8 oz (126.3 kg)  09/21/18 260 lb (117.9 kg)  03/15/15 272 lb 8 oz (123.6 kg)      ECOG FS:2 - Symptomatic, <50% confined to bed  Ocular: Sclerae unicteric, pupils round and  equal Ear-nose-throat: Wearing a mask Lymphatic: No cervical or supraclavicular adenopathy Lungs no rales or rhonchi Heart regular rate and rhythm Abd soft, obese nontender, positive bowel sounds MSK no focal spinal tenderness, no joint edema Neuro: non-focal, well-oriented, appropriate affect Breasts: I do not palpate a mass in the right breast and there are no skin or nipple changes of concern.  In the left breast upper medial quadrant there is an area where there was a prior infection and there is a slight irregularity.  I do not palpate other masses in the left breast.  Both axillae are benign.   LAB RESULTS:  CMP     Component Value Date/Time   NA 143 07/02/2019 1457   NA 141 01/03/2012 1730   K 4.1 07/02/2019 1457   K 3.0 (L) 01/03/2012 1730   CL 106 07/02/2019 1457   CL 104 01/03/2012 1730   CO2 26 07/02/2019 1457   CO2 31 01/03/2012 1730   GLUCOSE 98 07/02/2019 1457   GLUCOSE 92 01/03/2012 1730   BUN 20 07/02/2019 1457   BUN 12 01/03/2012 1730   CREATININE  0.86 07/02/2019 1457   CREATININE 0.94 01/03/2012 1730   CALCIUM 9.6 07/02/2019 1457   CALCIUM 8.4 (L) 01/03/2012 1730   PROT 7.5 07/02/2019 1457   PROT 7.7 01/03/2012 1730   ALBUMIN 3.9 07/02/2019 1457   ALBUMIN 3.4 01/03/2012 1730   AST 13 (L) 07/02/2019 1457   ALT 17 07/02/2019 1457   ALT 32 01/03/2012 1730   ALKPHOS 92 07/02/2019 1457   ALKPHOS 113 01/03/2012 1730   BILITOT 0.5 07/02/2019 1457   GFRNONAA >60 07/02/2019 1457   GFRNONAA >60 01/03/2012 1730   GFRAA >60 07/02/2019 1457   GFRAA >60 01/03/2012 1730    No results found for: TOTALPROTELP, ALBUMINELP, A1GS, A2GS, BETS, BETA2SER, GAMS, MSPIKE, SPEI  Lab Results  Component Value Date   WBC 8.7 07/02/2019   NEUTROABS 4.5 07/02/2019   HGB 15.7 (H) 07/02/2019   HCT 50.0 (H) 07/02/2019   MCV 94.5 07/02/2019   PLT 253 07/02/2019    No results found for: LABCA2  No components found for: MVHQIO962  No results for input(s): INR in the last  168 hours.  No results found for: LABCA2  No results found for: XBM841  No results found for: LKG401  No results found for: UUV253  No results found for: CA2729  No components found for: HGQUANT  No results found for: CEA1 / No results found for: CEA1   No results found for: AFPTUMOR  No results found for: CHROMOGRNA  No results found for: KPAFRELGTCHN, LAMBDASER, KAPLAMBRATIO (kappa/lambda light chains)  No results found for: HGBA, HGBA2QUANT, HGBFQUANT, HGBSQUAN (Hemoglobinopathy evaluation)   No results found for: LDH  No results found for: IRON, TIBC, IRONPCTSAT (Iron and TIBC)  No results found for: FERRITIN  Urinalysis    Component Value Date/Time   COLORURINE YELLOW 08/21/2013 1847   APPEARANCEUR CLOUDY (A) 08/21/2013 1847   LABSPEC 1.027 08/21/2013 1847   PHURINE 5.5 08/21/2013 1847   GLUCOSEU NEGATIVE 08/21/2013 1847   HGBUR NEGATIVE 08/21/2013 1847   BILIRUBINUR NEGATIVE 08/21/2013 1847   KETONESUR 15 (A) 08/21/2013 1847   PROTEINUR NEGATIVE 08/21/2013 1847   UROBILINOGEN 1.0 08/21/2013 1847   NITRITE NEGATIVE 08/21/2013 1847   LEUKOCYTESUR SMALL (A) 08/21/2013 1847     STUDIES: MM CLIP PLACEMENT LEFT  Result Date: 06/14/2019 CLINICAL DATA:  Confirmation of clip placement after stereotactic tomosynthesis core needle biopsy of a focal asymmetry in the LOWER INNER QUADRANT of the LEFT breast. EXAM: 2D AND TOMOSYNTHESIS DIAGNOSTIC LEFT MAMMOGRAM POST STEREOTACTIC BIOPSY COMPARISON:  Previous exam(s). FINDINGS: Tomosynthesis and synthesized full field CC and mediolateral images were obtained following stereotactic tomosynthesis guided biopsy of a focal asymmetry in the LOWER INNER LEFT breast. The X shaped tissue marking clip is appropriately positioned at the site of the biopsied focal asymmetry. Gas bubbles related to the biopsy are present at the site of the focal asymmetry. The X shaped clip is approximately 8 mm POSTERIOR to the previously placed coil  shaped clip. Expected post biopsy changes are present without evidence of hematoma. IMPRESSION: 1. Appropriate positioning of the X shaped biopsy marking clip at the site of the biopsied focal asymmetry in the LOWER INNER LEFT breast. 2. The X shaped clip is approximately 8 mm POSTERIOR to the previously placed coil shaped clip. Final Assessment: Post Procedure Mammograms for Marker Placement Electronically Signed   By: Evangeline Dakin M.D.   On: 06/14/2019 09:34   MM CLIP PLACEMENT LEFT  Result Date: 06/03/2019 CLINICAL DATA:  64 year old female status post bilateral breast  biopsy EXAM: DIAGNOSTIC BILATERAL MAMMOGRAM POST STEREOTACTIC AND ULTRASOUND BIOPSY COMPARISON:  Previous exam(s). FINDINGS: Mammographic images were obtained following stereotactic guided biopsy of the left breast and ultrasound-guided biopsy of the right breast. The biopsy marking clips are in expected positions at the sites of biopsy. IMPRESSION: Appropriate positioning of the coil shaped biopsy marking clips at the sites of biopsy in the bilateral breasts. Final Assessment: Post Procedure Mammograms for Marker Placement Electronically Signed   By: Kristopher Oppenheim M.D.   On: 06/03/2019 12:00   MM CLIP PLACEMENT RIGHT  Result Date: 06/03/2019 CLINICAL DATA:  64 year old female status post bilateral breast biopsy EXAM: DIAGNOSTIC BILATERAL MAMMOGRAM POST STEREOTACTIC AND ULTRASOUND BIOPSY COMPARISON:  Previous exam(s). FINDINGS: Mammographic images were obtained following stereotactic guided biopsy of the left breast and ultrasound-guided biopsy of the right breast. The biopsy marking clips are in expected positions at the sites of biopsy. IMPRESSION: Appropriate positioning of the coil shaped biopsy marking clips at the sites of biopsy in the bilateral breasts. Final Assessment: Post Procedure Mammograms for Marker Placement Electronically Signed   By: Kristopher Oppenheim M.D.   On: 06/03/2019 12:00   MM LT BREAST BX W LOC DEV 1ST LESION  IMAGE BX SPEC STEREO GUIDE  Addendum Date: 06/18/2019   ADDENDUM REPORT: 06/18/2019 08:36 ADDENDUM: Pathology revealed INTERMEDIATE GRADE DUCTAL CARCINOMA IN SITU WITH CALCIFICATIONS of the LEFT breast, lower inner quadrant. The carcinoma appears to be involving underlying complex sclerosing lesion. This was found to be concordant by Dr. Peggye Fothergill. Pathology results were discussed with the patient by telephone. The patient reported doing well after the biopsy with tenderness at the site. Post biopsy instructions and care were reviewed and questions were answered. The patient was encouraged to call [facility] for any additional concerns. Surgical consultation has been arranged with Dr. Autumn Messing at [facility] on June 16, 2019. Pathology results reported by Stacie Acres RN on 06/18/2019. Electronically Signed   By: Evangeline Dakin M.D.   On: 06/18/2019 08:36   Result Date: 06/18/2019 CLINICAL DATA:  64 year old with a screening detected focal asymmetry involving the LOWER INNER QUADRANT of the LEFT breast. She had a stereotactic tomosynthesis core needle biopsy of the asymmetry on 06/03/2019, pathology demonstrating benign breast parenchyma with no pathologic finding, felt to be a discordant pathology result. Therefore, repeat biopsy is performed. EXAM: LEFT BREAST STEREOTACTIC CORE NEEDLE BIOPSY COMPARISON:  Previous exams. FINDINGS: Initially, tomosynthesis and synthesized full field CC and mediolateral views of the LEFT breast were obtained in order to confirm that the focal asymmetry was still visible and not obscured by post biopsy changes. The focal asymmetry is still visible, so biopsy was performed as below. The patient was not charged for the initial images. The patient and I discussed the procedure of stereotactic-guided biopsy including benefits and alternatives. We discussed the high likelihood of a successful procedure. We discussed the risks of the procedure  including infection, bleeding, tissue injury, clip migration, and inadequate sampling. Informed written consent was given. The usual time out protocol was performed immediately prior to the procedure. Using sterile technique with chlorhexidine as skin antisepsis, 1% lidocaine and 1% lidocaine with epinephrine as local anesthetic, under stereotactic guidance, a 9 gauge Brevera vacuum assisted device was used to perform core needle biopsy of the focal asymmetry in the LOWER INNER LEFT breast using a medial approach. Specimen radiograph was performed showing soft tissue density in multiple core samples, with sample D having soft tissue density  with an apparent sharp margin, possibly representing the asymmetry. Faint calcifications are present in samples E and F, and faint calcifications were present at the site of the asymmetry on the diagnostic mammogram 04/23/2019. Lesion quadrant: LOWER INNER QUADRANT. At the conclusion of the procedure, an X shaped tissue marker clip was deployed into the biopsy cavity. Follow-up 2-view mammogram was performed and dictated separately. IMPRESSION: Stereotactic-guided biopsy of a focal asymmetry involving the LOWER INNER QUADRANT of the LEFT breast. No apparent complications. Electronically Signed: By: Evangeline Dakin M.D. On: 06/14/2019 09:23   MM LT BREAST BX W LOC DEV 1ST LESION IMAGE BX SPEC STEREO GUIDE  Addendum Date: 06/04/2019   ADDENDUM REPORT: 06/04/2019 14:54 ADDENDUM: Pathology revealed BENIGN BREAST PARENCHYMA WITH NO SPECIFIC HISTOPATHOLOGIC CHANGES of the LEFT breast, lower inner quadrant, anterior. This was found to be discordant by Dr. Kristopher Oppenheim, with repeat biopsy recommended. Pathology revealed SCLEROSING PAPILLARY LESION of the RIGHT breast, 3 o'clock, 1 cmfn. This was found to be concordant by Dr. Kristopher Oppenheim, with excision recommended. Pathology results were discussed with the patient by telephone. The patient reported doing well after the biopsies  with tenderness at the sites. Post biopsy instructions and care were reviewed and questions were answered. The patient was encouraged to call [facility] for any additional concerns. Patient is scheduled for LEFT breast stereotactic-guided biopsy on June 14, 2019. Surgical consultation has been arranged with Dr. Erroll Rivera at [facility] on June 24, 2019. Pathology results reported by Stacie Acres RN on 06/04/2019. Electronically Signed   By: Kristopher Oppenheim M.D.   On: 06/04/2019 14:54   Result Date: 06/04/2019 CLINICAL DATA:  64 year old female an indeterminate left breast asymmetry and right breast mass. EXAM: LEFT BREAST STEREOTACTIC CORE NEEDLE BIOPSY RIGHT BREAST ULTRASOUND CORE NEEDLE BIOPSY COMPARISON:  Previous exams. FINDINGS: The patient and I discussed the procedure of stereotactic-guided biopsy including benefits and alternatives. We discussed the high likelihood of a successful procedure. We discussed the risks of the procedure including infection, bleeding, tissue injury, clip migration, and inadequate sampling. Informed written consent was given. The usual time out protocol was performed immediately prior to the procedure. Lesion quadrant: Lower inner quadrant Using sterile technique and 1% Lidocaine as local anesthetic, under stereotactic guidance, a 9 gauge vacuum assisted device was used to perform core needle biopsy of an asymmetry in the left breast using a medial approach. At the conclusion of the procedure, coil shaped tissue marker clip was deployed into the biopsy cavity. Lesion quadrant: Lower inner quadrant Using sterile technique and 1% Lidocaine as local anesthetic, under direct ultrasound visualization, a 14 gauge spring-loaded device was used to perform biopsy of a mass at the 3 o'clock position of the right breast using a inferior approach. At the conclusion of the procedure coil shaped tissue marker clip was deployed into the biopsy cavity.  Follow-up 2-view mammogram was performed and dictated separately. IMPRESSION: Stereotactic-guided biopsy of of the left breast and ultrasound guided biopsy of the right breast. No apparent complications. Electronically Signed: By: Kristopher Oppenheim M.D. On: 06/03/2019 11:58   [place] RT BREAST BX W LOC DEV 1ST LESION IMG BX SPEC US GUIDE  Addendum Date: 06/04/2019   ADDENDUM REPORT: 06/04/2019 14:54 ADDENDUM: Pathology revealed BENIGN BREAST PARENCHYMA WITH NO SPECIFIC HISTOPATHOLOGIC CHANGES of the LEFT breast, lower inner quadrant, anterior. This was found to be discordant by Dr. Kristopher Oppenheim, with repeat biopsy recommended. Pathology revealed SCLEROSING PAPILLARY LESION of the RIGHT breast,  3 o'clock, 1 cmfn. This was found to be concordant by Dr. Kristopher Oppenheim, with excision recommended. Pathology results were discussed with the patient by telephone. The patient reported doing well after the biopsies with tenderness at the sites. Post biopsy instructions and care were reviewed and questions were answered. The patient was encouraged to call [facility] for any additional concerns. Patient is scheduled for LEFT breast stereotactic-guided biopsy on June 14, 2019. Surgical consultation has been arranged with Dr. Erroll Rivera at [facility] on June 24, 2019. Pathology results reported by Stacie Acres RN on 06/04/2019. Electronically Signed   By: Kristopher Oppenheim M.D.   On: 06/04/2019 14:54   Result Date: 06/04/2019 CLINICAL DATA:  64 year old female an indeterminate left breast asymmetry and right breast mass. EXAM: LEFT BREAST STEREOTACTIC CORE NEEDLE BIOPSY RIGHT BREAST ULTRASOUND CORE NEEDLE BIOPSY COMPARISON:  Previous exams. FINDINGS: The patient and I discussed the procedure of stereotactic-guided biopsy including benefits and alternatives. We discussed the high likelihood of a successful procedure. We discussed the risks of the procedure including infection, bleeding,  tissue injury, clip migration, and inadequate sampling. Informed written consent was given. The usual time out protocol was performed immediately prior to the procedure. Lesion quadrant: Lower inner quadrant Using sterile technique and 1% Lidocaine as local anesthetic, under stereotactic guidance, a 9 gauge vacuum assisted device was used to perform core needle biopsy of an asymmetry in the left breast using a medial approach. At the conclusion of the procedure, coil shaped tissue marker clip was deployed into the biopsy cavity. Lesion quadrant: Lower inner quadrant Using sterile technique and 1% Lidocaine as local anesthetic, under direct ultrasound visualization, a 14 gauge spring-loaded device was used to perform biopsy of a mass at the 3 o'clock position of the right breast using a inferior approach. At the conclusion of the procedure coil shaped tissue marker clip was deployed into the biopsy cavity. Follow-up 2-view mammogram was performed and dictated separately. IMPRESSION: Stereotactic-guided biopsy of of the left breast and ultrasound guided biopsy of the right breast. No apparent complications. Electronically Signed: By: Kristopher Oppenheim M.D. On: 06/03/2019 11:58     ELIGIBLE FOR AVAILABLE RESEARCH PROTOCOL: AET  ASSESSMENT: 64 y.o. [place] woman status post bilateral breast biopsies 06/03/2019 showing  (a) in the left breast, benign tissue, felt to be discordant    (b) in the right breast, a complex sclerosing lesion  (c) repeat left breast biopsy 06/14/2019 shows ductal carcinoma in situ, grade 2, estrogen and progesterone receptor positive  (1) definitive surgery pending  (2) adjuvant radiation to follow (referral placed 07/02/2019)  (3) consider antiestrogens  PLAN: I met today with Caitlin Rivera to review her new diagnosis. Specifically we discussed the biology of her breast cancer, its diagnosis, staging, treatment  options and prognosis. Caitlin Rivera understands that in noninvasive ductal  carcinoma, also called ductal carcinoma in situ ("DCIS") the breast cancer cells remain trapped in the ducts were they started. They cannot travel to a vital organ. For that reason these cancers in themselves are not life-threatening.  If the whole breast is removed then all the ducts are removed and since the cancer cells are trapped in the ducts, the cure rate with mastectomy for noninvasive breast cancer is approximately 99%. Nevertheless we recommend lumpectomy, because there is no survival advantage to mastectomy and because the cosmetic result is generally superior with breast conservation.  Since the patient is keeping her breasts, there will be some risk of recurrence. The  recurrence can only be in the same breast since, again, the cells are trapped in the ducts. There is no connection from one breast to the other. The risk of local recurrence is cut by more than half with radiation, which is standard in this situation.  In estrogen receptor positive cancers like Gail's, anti-estrogens can also be considered. They will further reduce the risk of recurrence by one half. In addition anti-estrogens will lower the risk of a new breast cancer developing in either breast, also by one half. That risk otherwise approaches 1% per year.   Accordingly the overall plan is for surgery, followed by radiation, then a discussion of anti-estrogens.  Note that Caitlin Rivera does not qualify for the COMET trial because of the contralateral sclerosing lesion.  She would need to have that first removed and shown to be benign in which case she could qualify it but it was felt this would put her through 2 procedures instead of 1 and it is not recommended.  She will see me again in about 3 months.  She should be done with surgery and radiation at that time and will be ready to start anastrozole  I am obtaining lumbar spine films at her request.  Caitlin Rivera has a good understanding of the overall plan. She agrees with it. She knows  the goal of treatment in her case is cure. She will call with any problems that may develop before her next visit here.  Total encounter time 60 minutes.Sarajane Jews C. Rosia Syme, MD 07/02/2019 4:47 PM Medical Oncology and Hematology [facility] [place], [place] [address] Tel. [phone]    Fax. [phone]   This document serves as a record of services personally performed by Lurline Del, MD. It was created on his behalf by Wilburn Mylar, a trained medical scribe. The creation of this record is based on the scribe's personal observations and the provider's statements to them.   I, Lurline Del MD, have reviewed the above documentation for accuracy and completeness, and I agree with the above.    *Total Encounter Time as defined by the Centers for Medicare and Medicaid Services includes, in addition to the face-to-face time of a patient visit (documented in the note above) non-face-to-face time: obtaining and reviewing outside history, ordering and reviewing medications, tests or procedures, care coordination (communications with other health care professionals or caregivers) and documentation in the medical record.

## 2019-07-02 ENCOUNTER — Ambulatory Visit (HOSPITAL_COMMUNITY)
Admission: RE | Admit: 2019-07-02 | Discharge: 2019-07-02 | Disposition: A | Discharge status: Home / Self Care | Payer: Medicaid Other | Source: Ambulatory Visit | Attending: Oncology | Admitting: Oncology

## 2019-07-02 ENCOUNTER — Other Ambulatory Visit: Payer: Self-pay

## 2019-07-02 ENCOUNTER — Inpatient Hospital Stay: Payer: Medicaid Other

## 2019-07-02 ENCOUNTER — Inpatient Hospital Stay: Payer: Medicaid Other | Attending: Oncology | Admitting: Oncology

## 2019-07-02 VITALS — BP 133/70 | HR 79 | Temp 98.5°F | Resp 18 | Ht 66.5 in | Wt 278.5 lb

## 2019-07-02 DIAGNOSIS — E7800 Pure hypercholesterolemia, unspecified: Secondary | ICD-10-CM | POA: Insufficient documentation

## 2019-07-02 DIAGNOSIS — Z809 Family history of malignant neoplasm, unspecified: Secondary | ICD-10-CM | POA: Diagnosis not present

## 2019-07-02 DIAGNOSIS — E119 Type 2 diabetes mellitus without complications: Secondary | ICD-10-CM | POA: Insufficient documentation

## 2019-07-02 DIAGNOSIS — E1169 Type 2 diabetes mellitus with other specified complication: Secondary | ICD-10-CM | POA: Insufficient documentation

## 2019-07-02 DIAGNOSIS — D0512 Intraductal carcinoma in situ of left breast: Secondary | ICD-10-CM | POA: Insufficient documentation

## 2019-07-02 DIAGNOSIS — Z808 Family history of malignant neoplasm of other organs or systems: Secondary | ICD-10-CM

## 2019-07-02 DIAGNOSIS — C50312 Malignant neoplasm of lower-inner quadrant of left female breast: Secondary | ICD-10-CM | POA: Insufficient documentation

## 2019-07-02 DIAGNOSIS — F17210 Nicotine dependence, cigarettes, uncomplicated: Secondary | ICD-10-CM | POA: Diagnosis not present

## 2019-07-02 DIAGNOSIS — Z170 Estrogen receptor positive status [ER+]: Secondary | ICD-10-CM | POA: Diagnosis not present

## 2019-07-02 DIAGNOSIS — Z800 Family history of malignant neoplasm of digestive organs: Secondary | ICD-10-CM

## 2019-07-02 DIAGNOSIS — Z6841 Body Mass Index (BMI) 40.0 and over, adult: Secondary | ICD-10-CM | POA: Insufficient documentation

## 2019-07-02 DIAGNOSIS — I10 Essential (primary) hypertension: Secondary | ICD-10-CM | POA: Insufficient documentation

## 2019-07-02 DIAGNOSIS — Z780 Asymptomatic menopausal state: Secondary | ICD-10-CM | POA: Diagnosis not present

## 2019-07-02 LAB — CBC WITH DIFFERENTIAL (CANCER CENTER ONLY)
Abs Immature Granulocytes: 0.02 10*3/uL (ref 0.00–0.07)
Basophils Absolute: 0.1 10*3/uL (ref 0.0–0.1)
Basophils Relative: 1 %
Eosinophils Absolute: 0.2 10*3/uL (ref 0.0–0.5)
Eosinophils Relative: 2 %
HCT: 50 % — ABNORMAL HIGH (ref 36.0–46.0)
Hemoglobin: 15.7 g/dL — ABNORMAL HIGH (ref 12.0–15.0)
Immature Granulocytes: 0 %
Lymphocytes Relative: 39 %
Lymphs Abs: 3.4 10*3/uL (ref 0.7–4.0)
MCH: 29.7 pg (ref 26.0–34.0)
MCHC: 31.4 g/dL (ref 30.0–36.0)
MCV: 94.5 fL (ref 80.0–100.0)
Monocytes Absolute: 0.5 10*3/uL (ref 0.1–1.0)
Monocytes Relative: 6 %
Neutro Abs: 4.5 10*3/uL (ref 1.7–7.7)
Neutrophils Relative %: 52 %
Platelet Count: 253 10*3/uL (ref 150–400)
RBC: 5.29 MIL/uL — ABNORMAL HIGH (ref 3.87–5.11)
RDW: 14.1 % (ref 11.5–15.5)
WBC Count: 8.7 10*3/uL (ref 4.0–10.5)
nRBC: 0 % (ref 0.0–0.2)

## 2019-07-02 LAB — CMP (CANCER CENTER ONLY)
ALT: 17 U/L (ref 0–44)
AST: 13 U/L — ABNORMAL LOW (ref 15–41)
Albumin: 3.9 g/dL (ref 3.5–5.0)
Alkaline Phosphatase: 92 U/L (ref 38–126)
Anion gap: 11 (ref 5–15)
BUN: 20 mg/dL (ref 8–23)
CO2: 26 mmol/L (ref 22–32)
Calcium: 9.6 mg/dL (ref 8.9–10.3)
Chloride: 106 mmol/L (ref 98–111)
Creatinine: 0.86 mg/dL (ref 0.44–1.00)
GFR, Est AFR Am: >60 mL/min (ref >60)
GFR, Estimated: >60 mL/min (ref >60)
Glucose, Bld: 98 mg/dL (ref 70–99)
Potassium: 4.1 mmol/L (ref 3.5–5.1)
Sodium: 143 mmol/L (ref 135–145)
Total Bilirubin: 0.5 mg/dL (ref 0.3–1.2)
Total Protein: 7.5 g/dL (ref 6.5–8.1)

## 2019-07-03 ENCOUNTER — Encounter: Payer: Self-pay | Admitting: *Deleted

## 2019-07-03 ENCOUNTER — Telehealth: Payer: Self-pay | Admitting: Oncology

## 2019-07-03 NOTE — Telephone Encounter (Signed)
Scheduled appts per 6/22 los. Left voicemail with appt date and time, mailed appt reminder and calendar.

## 2019-07-04 ENCOUNTER — Encounter: Payer: Self-pay | Admitting: Oncology

## 2019-07-05 ENCOUNTER — Encounter: Payer: Self-pay | Admitting: Licensed Clinical Social Worker

## 2019-07-05 NOTE — Progress Notes (Signed)
[place] [employer] representative contacted patient by phone  to offer support and assess for needs for patient with newly diagnosed breast cancer. Patient states that initially she was overwhelmed but feels better and ready to move forward after learning more about her diagnosis and treatment plan. She is currently disabled due to other health issues. She receives transportation through social services so will be able to get to appointments. She has support from her daughters who live in [place] and [place]. No specific needs identified at this time. [place] provided information on support services and gave contact information for patient to call if needs arise in the future.        Jaiveer Panas, [place], [place] Worker [place]

## 2019-07-08 NOTE — Progress Notes (Signed)
Location of Breast Cancer: Left Breast Cancer  Did patient present with symptoms (if so, please note symptoms) or was this found on screening mammography?: Routine mammogram 04/23/2019  Diagnostic Mammogram/US 05/01/2019: Breast density category B; persistent lower inner left breast asymmetry with no ultrasound correlate; superficial 0.7 cm right breast mass at 3 o'clock  Mammogram 04/23/2019: Possible abnormality in the bilateral breasts.  Histology per Pathology Report: Left Breast 06/14/2019   Receptor Status: ER(95% +), PR (85% +), Her2-neu (), Ki-()  Histology per Pathology Report: Bilateral Breast 06/03/2019   Past/Anticipated interventions by surgeon, if any: Dr. Marlou Starks 06/19/2019 - I have discussed with her in detail the different options for treatment of the DCIS and at this point she is leaning towards mastectomy. -She will need to talk with her family before making final decision. -She will also need a lumpectomy of the sclerosing papillary lesion because of the small risk of missing something more important. -I will go ahead and make a referral to medical and radiation oncology.   Past/Anticipated interventions by medical oncology, if any: Chemotherapy  Dr. Jana Hakim 07/02/2019 -Accordingly the overall plan is for surgery, followed by radiation, then a discussion of anti-estrogens. -Note that [facility] Janus does not qualify for the COMET trial because of the contralateral sclerosing lesion.  She would need to have that first removed and shown to be benign in which case she could qualify it but it was felt this would put her through 2 procedures instead of 1 and it is not recommended.  Lymphedema issues, if any:  No  Pain issues, if any:  No  SAFETY ISSUES:  Prior radiation? No  Pacemaker/ICD?  No  Possible current pregnancy? Postmenopausal  Is the patient on methotrexate? No  Current Complaints / other details:      Cori Razor, RN 07/08/2019,2:53 PM

## 2019-07-09 ENCOUNTER — Telehealth: Payer: Medicaid Other | Admitting: Radiation Oncology

## 2019-07-09 ENCOUNTER — Ambulatory Visit: Payer: Self-pay | Admitting: General Surgery

## 2019-07-09 ENCOUNTER — Ambulatory Visit
Admission: RE | Admit: 2019-07-09 | Discharge: 2019-07-09 | Disposition: A | Discharge status: Home / Self Care | Payer: Medicaid Other | Source: Ambulatory Visit | Attending: Radiation Oncology | Admitting: Radiation Oncology

## 2019-07-09 ENCOUNTER — Encounter: Payer: Self-pay | Admitting: Radiation Oncology

## 2019-07-09 ENCOUNTER — Other Ambulatory Visit: Payer: Self-pay

## 2019-07-09 DIAGNOSIS — D0512 Intraductal carcinoma in situ of left breast: Secondary | ICD-10-CM

## 2019-07-09 DIAGNOSIS — D241 Benign neoplasm of right breast: Secondary | ICD-10-CM

## 2019-07-09 DIAGNOSIS — C50312 Malignant neoplasm of lower-inner quadrant of left female breast: Secondary | ICD-10-CM

## 2019-07-09 NOTE — Progress Notes (Signed)
Radiation Oncology         [phone] ________________________________  Initial Outpatient Consultation - Conducted via telephone due to current COVID-19 concerns for limiting patient exposure  I spoke with the patient to conduct this consult visit via telephone to spare the patient unnecessary potential exposure in the healthcare setting during the current COVID-19 pandemic. The patient was notified in advance and was offered a [facility] meeting to allow for face to face communication but unfortunately reported that they did not have the appropriate resources/technology to support such a visit and instead preferred to proceed with a telephone consult.    Name: Caitlin Rivera        MRN: [id number]  Date of Service: 07/09/2019 DOB: 05-21-1955  KG:[id number], Iona Beard, MD  Magrinat, Virgie Dad, MD     REFERRING PHYSICIAN: Magrinat, Virgie Dad, MD   DIAGNOSIS: The encounter diagnosis was Malignant neoplasm of lower-inner quadrant of left breast in female, estrogen receptor positive ([facility]).   HISTORY OF PRESENT ILLNESS: Caitlin Rivera is a 63 y.o. female with a new diagnosis of left breast cancer. The patient was noted to have a screening detected asymmetry in the left breast as well as a possible mass in the right breast.  She underwent further diagnostic imaging which revealed no sonographic correlate to the asymmetry on mammography in the left breast but did show a right breast mass measuring 7 x 4 x 5 mm at the 3 o'clock position, bilateral axilla were negative for adenopathy.  She underwent biopsies of each site on 06/03/2019.  The right needle biopsy at 3:00 revealed a sclerosing papillary lesion.  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  She has met with Dr. Jana Hakim and discussed options of surgical intervention and antiestrogen therapy as well as radiotherapy if she proceeds with breast conserving surgery.  She has met with Dr. Marlou Starks, she is contemplating mastectomy but is seen today on my chart to discuss the role of adjuvant radiotherapy if she chose breast conserving surgery.  PREVIOUS RADIATION THERAPY: No   PAST MEDICAL HISTORY:  Past Medical History:  Diagnosis Date  . Fibroids        PAST SURGICAL HISTORY: Past Surgical History:  Procedure Laterality Date  . TUBAL LIGATION    . TUBAL LIGATION       FAMILY HISTORY: No family history on file.   SOCIAL HISTORY:  reports that she has been smoking cigarettes. She has been smoking about 0.50 packs per day. She does not have any smokeless tobacco history on file. She reports current drug use. She reports that she does not drink alcohol. The patient is widowed and lives in [place]. Her daughter Varney Biles accompanies [place] on the call as well.    ALLERGIES: Morphine and related and Ibuprofen   MEDICATIONS:  Current Outpatient Medications  Medication Sig Dispense Refill  . albuterol (VENTOLIN HFA) 108 (90 Base) MCG/ACT inhaler     . amLODipine (NORVASC) 5 MG tablet Take 5 mg by mouth daily.    Marland Kitchen doxycycline (VIBRAMYCIN) 100 MG capsule Take 100 mg by mouth 2 (two) times daily.    Marland Kitchen gabapentin (NEURONTIN) 400 MG capsule Take 400 mg by mouth 3 (three) times daily.    . metFORMIN (  GLUCOPHAGE) 500 MG tablet Take 500 mg by mouth 2 (two) times daily.    Marland Kitchen OVER THE COUNTER MEDICATION Take 1-2 tablets by mouth 2 (two) times daily as needed (for pain). OTC Pain Reliever PM    . rosuvastatin (CRESTOR) 20 MG tablet Take 20 mg by mouth daily.    . SYMBICORT 160-4.5 MCG/ACT inhaler     . tiZANidine (ZANAFLEX) 4 MG tablet Take 4 mg by mouth every 8 (eight) hours as needed.     No current facility-administered  medications for this encounter.     REVIEW OF SYSTEMS: On review of systems, the patient reports that she is doing well overall. She denies any chest pain, shortness of breath, cough, fevers, chills, night sweats, unintended weight changes. She denies any bowel or bladder disturbances, and denies abdominal pain, nausea or vomiting. She denies any new musculoskeletal or joint aches or pains. A complete review of systems is obtained and is otherwise negative.     PHYSICAL EXAM:  Wt Readings from Last 3 Encounters:  07/02/19 278 lb 8 oz (126.3 kg)  09/21/18 260 lb (117.9 kg)  03/15/15 272 lb 8 oz (123.6 kg)   Unable to assess due toe encounter type.   ECOG = 0  0 - Asymptomatic (Fully active, able to carry on all predisease activities without restriction)  1 - Symptomatic but completely ambulatory (Restricted in physically strenuous activity but ambulatory and able to carry out work of a light or sedentary nature. For example, light housework, office work)  2 - Symptomatic, <50% in bed during the day (Ambulatory and capable of all self care but unable to carry out any work activities. Up and about more than 50% of waking hours)  3 - Symptomatic, >50% in bed, but not bedbound (Capable of only limited self-care, confined to bed or chair 50% or more of waking hours)  4 - Bedbound (Completely disabled. Cannot carry on any self-care. Totally confined to bed or chair)  5 - Death   Eustace Pen MM, Creech RH, Tormey DC, et al. [phone]). "Toxicity and response criteria of the [facility]". [place] Oncol. 5 (6): 649-55    LABORATORY DATA:  Lab Results  Component Value Date   WBC 8.7 07/02/2019   HGB 15.7 (H) 07/02/2019   HCT 50.0 (H) 07/02/2019   MCV 94.5 07/02/2019   PLT 253 07/02/2019   Lab Results  Component Value Date   NA 143 07/02/2019   K 4.1 07/02/2019   CL 106 07/02/2019   CO2 26 07/02/2019   Lab Results  Component Value Date   ALT 17 07/02/2019    AST 13 (L) 07/02/2019   ALKPHOS 92 07/02/2019   BILITOT 0.5 07/02/2019      RADIOGRAPHY: DG Lumbar Spine Complete  Result Date: 07/04/2019 CLINICAL DATA:  Low back pain EXAM: LUMBAR SPINE - COMPLETE 4+ VIEW COMPARISON:  12/13/2012 FINDINGS: Five lumbar type vertebral bodies are well visualized. Vertebral body height is well maintained. Mild osteophytic changes are noted. Very minimal scoliosis concave to the right is noted centered at L2. No pars defects are noted. Mild facet hypertrophic changes are noted. Disc space narrowing is noted at L3-4 and L4-5. Mild degenerative anterolisthesis of L4 on L5 is noted. This is new from the prior exam of 2014. Mild aortic calcifications are noted. IMPRESSION: Degenerative change with mild anterolisthesis of L4 on L5. Mild scoliosis. Electronically Signed   By: Inez Catalina M.D.   On: 07/04/2019 09:02  MM CLIP PLACEMENT LEFT  Result Date: 06/14/2019 CLINICAL DATA:  Confirmation of clip placement after stereotactic tomosynthesis core needle biopsy of a focal asymmetry in the LOWER INNER QUADRANT of the LEFT breast. EXAM: 2D AND TOMOSYNTHESIS DIAGNOSTIC LEFT MAMMOGRAM POST STEREOTACTIC BIOPSY COMPARISON:  Previous exam(s). FINDINGS: Tomosynthesis and synthesized full field CC and mediolateral images were obtained following stereotactic tomosynthesis guided biopsy of a focal asymmetry in the LOWER INNER LEFT breast. The X shaped tissue marking clip is appropriately positioned at the site of the biopsied focal asymmetry. Gas bubbles related to the biopsy are present at the site of the focal asymmetry. The X shaped clip is approximately 8 mm POSTERIOR to the previously placed coil shaped clip. Expected post biopsy changes are present without evidence of hematoma. IMPRESSION: 1. Appropriate positioning of the X shaped biopsy marking clip at the site of the biopsied focal asymmetry in the LOWER INNER LEFT breast. 2. The X shaped clip is approximately 8 mm POSTERIOR to the  previously placed coil shaped clip. Final Assessment: Post Procedure Mammograms for Marker Placement Electronically Signed   By: Evangeline Dakin M.D.   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 The carcinoma appears to be involving underlying complex sclerosing lesion. This was found to be concordant by Dr. Peggye Fothergill. Pathology results were discussed with the patient by telephone. The patient reported doing well after the biopsy with tenderness at the site. Post biopsy instructions and care were reviewed and questions were answered. The patient was encouraged to call [facility] for any additional concerns. Surgical consultation has been arranged with Dr. Autumn Messing at [facility] on June 16, 2019. Pathology results reported by Stacie Acres RN on 06/18/2019. Electronically Signed   By: Evangeline Dakin M.D.   On: 06/18/2019 08:36   Result Date: 06/18/2019 CLINICAL DATA:  64 year old with a screening detected focal asymmetry involving the LOWER INNER QUADRANT of the LEFT breast. She had a stereotactic tomosynthesis core needle biopsy of the asymmetry on 06/03/2019, pathology demonstrating benign breast parenchyma with no pathologic finding, felt to be a discordant pathology result. Therefore, repeat biopsy is performed. EXAM: LEFT BREAST STEREOTACTIC CORE NEEDLE BIOPSY COMPARISON:  Previous exams. FINDINGS: Initially, tomosynthesis and synthesized full field CC and mediolateral views of the LEFT breast were obtained in order to confirm that the focal asymmetry was still visible and not obscured by post biopsy changes. The focal asymmetry is still visible, so biopsy was performed as below. The patient was not charged for the initial  images. The patient and I discussed the procedure of stereotactic-guided biopsy including benefits and alternatives. We discussed the high likelihood of a successful procedure. We discussed the risks of the procedure including infection, bleeding, tissue injury, clip migration, and inadequate sampling. Informed written consent was given. The usual time out protocol was performed immediately prior to the procedure. Using sterile technique with chlorhexidine as skin antisepsis, 1% lidocaine and 1% lidocaine with epinephrine as local anesthetic, under stereotactic guidance, a 9 gauge Brevera vacuum assisted device was used to perform core needle biopsy of the focal asymmetry in the LOWER INNER LEFT breast using a medial approach. Specimen radiograph was performed showing soft tissue density in multiple core samples, with sample D having soft  tissue density with an apparent sharp margin, possibly representing the asymmetry. Faint calcifications are present in samples E and F, and faint calcifications were present at the site of the asymmetry on the diagnostic mammogram 04/23/2019. Lesion quadrant: LOWER INNER QUADRANT. At the conclusion of the procedure, an X shaped tissue marker clip was deployed into the biopsy cavity. Follow-up 2-view mammogram was performed and dictated separately. IMPRESSION: Stereotactic-guided biopsy of a focal asymmetry involving the LOWER INNER QUADRANT of the LEFT breast. No apparent complications. Electronically Signed: By: Evangeline Dakin M.D. On: 06/14/2019 09:23       IMPRESSION/PLAN: 1. 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 Dr. Lisbeth Renshaw discusses the pathology findings and reviews the nature of noninvasive breast disease and the findings of complex sclerosing lesion.  The patient is counseled on the findings and was still contemplating surgical planning. At the conclusion of our call though she is really to proceed with  lumpectomy of each breast. We discussed the rationale for lumpectomy in patients who undergo breast conserving surgery to treat DCIS.  She is also aware of the possibility of identifying a cancer within the complex sclerosing lesion on the right.  We discussed that based on what we know about her situation currently that there would be a role for radiotherapy to the left breast with breath hold technique.  We discussed the risks, benefits, short, and long term effects of radiotherapy for women who undergo breast conserving surgery and she is interested in proceeding. Dr. Lisbeth Renshaw discusses the delivery and logistics of radiotherapy in this setting and if she proceeded in this manner, anticipates a course of 4 - 6 1/2 weeks of radiotherapy. We will see her back about 2 weeks after surgery to discuss the simulation process and anticipate we starting radiotherapy about 4-6 weeks after surgery.     Given current concerns for patient exposure during the COVID-19 pandemic, this encounter was conducted via telephone.  The patient has provided two factor identification and has given verbal consent for this type of encounter and has been advised to only accept a meeting of this type in a secure network environment. The time spent during this encounter was 60 minutes including preparation, discussion, and coordination of the patient's care. The attendants for this meeting include Blenda Nicely, RN, Dr. Lisbeth Renshaw, Hayden Pedro  and [place] Day Rivera.  During the encounter,  Blenda Nicely, RN, Dr. Lisbeth Renshaw, and Hayden Pedro were located at [facility] Radiation Oncology Department.  Caitlin Rivera was located at home.   The above documentation reflects my direct findings during this shared patient visit. Please see the separate note by Dr. Lisbeth Renshaw on this date for the remainder of the patient's plan of care.    Carola Rhine, PAC

## 2019-07-11 ENCOUNTER — Other Ambulatory Visit: Payer: Self-pay | Admitting: General Surgery

## 2019-07-11 DIAGNOSIS — D0512 Intraductal carcinoma in situ of left breast: Secondary | ICD-10-CM

## 2019-07-11 HISTORY — PX: BREAST LUMPECTOMY: SHX2

## 2019-07-11 HISTORY — PX: BREAST EXCISIONAL BIOPSY: SUR124

## 2019-07-18 ENCOUNTER — Other Ambulatory Visit: Payer: Self-pay | Admitting: *Deleted

## 2019-07-18 ENCOUNTER — Encounter: Payer: Self-pay | Admitting: *Deleted

## 2019-07-18 DIAGNOSIS — C50312 Malignant neoplasm of lower-inner quadrant of left female breast: Secondary | ICD-10-CM

## 2019-07-22 ENCOUNTER — Encounter: Payer: Self-pay | Admitting: *Deleted

## 2019-07-26 ENCOUNTER — Other Ambulatory Visit: Payer: Self-pay

## 2019-07-26 ENCOUNTER — Encounter (HOSPITAL_BASED_OUTPATIENT_CLINIC_OR_DEPARTMENT_OTHER): Payer: Self-pay | Admitting: General Surgery

## 2019-07-26 NOTE — Progress Notes (Signed)
   07/26/19 1121  OBSTRUCTIVE SLEEP APNEA  Have you ever been diagnosed with sleep apnea through a sleep study? No  Do you snore loudly (loud enough to be heard through closed doors)?  1  Do you often feel tired, fatigued, or sleepy during the daytime (such as falling asleep during driving or talking to someone)? 1  Has anyone observed you stop breathing during your sleep? 0  Do you have, or are you being treated for high blood pressure? 1  BMI more than 35 kg/m2? 1  Age > 50 (1-yes) 1  Neck circumference greater than:Female 16 inches or larger, Female 17inches or larger? 1  Female Gender (Yes=1) 0  Obstructive Sleep Apnea Score 6  Score 5 or greater  Results sent to PCP

## 2019-07-27 ENCOUNTER — Other Ambulatory Visit (HOSPITAL_COMMUNITY)
Admission: RE | Admit: 2019-07-27 | Discharge: 2019-07-27 | Disposition: A | Discharge status: Home / Self Care | Payer: Medicaid Other | Source: Ambulatory Visit | Attending: Plastic Surgery | Admitting: Plastic Surgery

## 2019-07-27 DIAGNOSIS — Z01818 Encounter for other preprocedural examination: Secondary | ICD-10-CM | POA: Insufficient documentation

## 2019-07-27 DIAGNOSIS — Z20822 Contact with and (suspected) exposure to covid-19: Secondary | ICD-10-CM | POA: Insufficient documentation

## 2019-07-27 LAB — SARS CORONAVIRUS 2 (TAT 6-24 HRS): SARS Coronavirus 2: NEGATIVE

## 2019-07-29 ENCOUNTER — Encounter (HOSPITAL_BASED_OUTPATIENT_CLINIC_OR_DEPARTMENT_OTHER)
Admission: RE | Admit: 2019-07-29 | Discharge: 2019-07-29 | Disposition: A | Discharge status: Home / Self Care | Payer: Medicaid Other | Source: Ambulatory Visit | Attending: General Surgery | Admitting: General Surgery

## 2019-07-29 DIAGNOSIS — Z01818 Encounter for other preprocedural examination: Secondary | ICD-10-CM | POA: Diagnosis present

## 2019-07-29 LAB — BASIC METABOLIC PANEL
Anion gap: 11 (ref 5–15)
BUN: 12 mg/dL (ref 8–23)
CO2: 26 mmol/L (ref 22–32)
Calcium: 9.3 mg/dL (ref 8.9–10.3)
Chloride: 103 mmol/L (ref 98–111)
Creatinine, Ser: 0.81 mg/dL (ref 0.44–1.00)
GFR calc Af Amer: >60 mL/min (ref >60)
GFR calc non Af Amer: >60 mL/min (ref >60)
Glucose, Bld: 135 mg/dL — ABNORMAL HIGH (ref 70–99)
Potassium: 5.2 mmol/L — ABNORMAL HIGH (ref 3.5–5.1)
Sodium: 140 mmol/L (ref 135–145)

## 2019-07-29 NOTE — Progress Notes (Signed)

## 2019-07-30 ENCOUNTER — Other Ambulatory Visit: Payer: Self-pay

## 2019-07-30 ENCOUNTER — Ambulatory Visit
Admission: RE | Admit: 2019-07-30 | Discharge: 2019-07-30 | Disposition: A | Discharge status: Home / Self Care | Payer: Medicaid Other | Source: Ambulatory Visit | Attending: General Surgery | Admitting: General Surgery

## 2019-07-30 DIAGNOSIS — D0512 Intraductal carcinoma in situ of left breast: Secondary | ICD-10-CM

## 2019-07-30 DIAGNOSIS — D241 Benign neoplasm of right breast: Secondary | ICD-10-CM

## 2019-07-30 NOTE — Progress Notes (Signed)
K+ 5.2, Dr. Christella Hartigan aware, will proceed with surgery as scheduled.

## 2019-07-31 ENCOUNTER — Encounter (HOSPITAL_BASED_OUTPATIENT_CLINIC_OR_DEPARTMENT_OTHER): Payer: Self-pay | Admitting: General Surgery

## 2019-07-31 ENCOUNTER — Encounter (HOSPITAL_BASED_OUTPATIENT_CLINIC_OR_DEPARTMENT_OTHER)
Admission: RE | Disposition: A | Discharge status: Home / Self Care | Payer: Self-pay | Source: Home / Self Care | Attending: General Surgery

## 2019-07-31 ENCOUNTER — Ambulatory Visit (HOSPITAL_BASED_OUTPATIENT_CLINIC_OR_DEPARTMENT_OTHER): Payer: Medicaid Other | Admitting: Certified Registered"

## 2019-07-31 ENCOUNTER — Other Ambulatory Visit: Payer: Self-pay

## 2019-07-31 ENCOUNTER — Ambulatory Visit
Admission: RE | Admit: 2019-07-31 | Discharge: 2019-07-31 | Disposition: A | Discharge status: Home / Self Care | Payer: Medicaid Other | Source: Ambulatory Visit | Attending: General Surgery | Admitting: General Surgery

## 2019-07-31 ENCOUNTER — Ambulatory Visit (HOSPITAL_BASED_OUTPATIENT_CLINIC_OR_DEPARTMENT_OTHER)
Admission: RE | Admit: 2019-07-31 | Discharge: 2019-07-31 | Disposition: A | Discharge status: Home / Self Care | Payer: Medicaid Other | Attending: General Surgery | Admitting: General Surgery

## 2019-07-31 DIAGNOSIS — Z7984 Long term (current) use of oral hypoglycemic drugs: Secondary | ICD-10-CM | POA: Insufficient documentation

## 2019-07-31 DIAGNOSIS — D0512 Intraductal carcinoma in situ of left breast: Secondary | ICD-10-CM

## 2019-07-31 DIAGNOSIS — Z7951 Long term (current) use of inhaled steroids: Secondary | ICD-10-CM | POA: Insufficient documentation

## 2019-07-31 DIAGNOSIS — C50912 Malignant neoplasm of unspecified site of left female breast: Secondary | ICD-10-CM | POA: Insufficient documentation

## 2019-07-31 DIAGNOSIS — Z791 Long term (current) use of non-steroidal anti-inflammatories (NSAID): Secondary | ICD-10-CM | POA: Insufficient documentation

## 2019-07-31 DIAGNOSIS — E119 Type 2 diabetes mellitus without complications: Secondary | ICD-10-CM | POA: Diagnosis not present

## 2019-07-31 DIAGNOSIS — I10 Essential (primary) hypertension: Secondary | ICD-10-CM | POA: Insufficient documentation

## 2019-07-31 DIAGNOSIS — F17210 Nicotine dependence, cigarettes, uncomplicated: Secondary | ICD-10-CM | POA: Insufficient documentation

## 2019-07-31 DIAGNOSIS — Z885 Allergy status to narcotic agent status: Secondary | ICD-10-CM | POA: Diagnosis not present

## 2019-07-31 DIAGNOSIS — J449 Chronic obstructive pulmonary disease, unspecified: Secondary | ICD-10-CM | POA: Insufficient documentation

## 2019-07-31 DIAGNOSIS — Z170 Estrogen receptor positive status [ER+]: Secondary | ICD-10-CM | POA: Insufficient documentation

## 2019-07-31 DIAGNOSIS — Z886 Allergy status to analgesic agent status: Secondary | ICD-10-CM | POA: Diagnosis not present

## 2019-07-31 DIAGNOSIS — Z6841 Body Mass Index (BMI) 40.0 and over, adult: Secondary | ICD-10-CM | POA: Diagnosis not present

## 2019-07-31 DIAGNOSIS — Z79899 Other long term (current) drug therapy: Secondary | ICD-10-CM | POA: Insufficient documentation

## 2019-07-31 DIAGNOSIS — E7800 Pure hypercholesterolemia, unspecified: Secondary | ICD-10-CM | POA: Diagnosis not present

## 2019-07-31 DIAGNOSIS — D241 Benign neoplasm of right breast: Secondary | ICD-10-CM | POA: Insufficient documentation

## 2019-07-31 HISTORY — DX: Essential (primary) hypertension: I10

## 2019-07-31 HISTORY — DX: Chronic obstructive pulmonary disease, unspecified: J44.9

## 2019-07-31 HISTORY — DX: Type 2 diabetes mellitus without complications: E11.9

## 2019-07-31 HISTORY — DX: Nicotine dependence, unspecified, uncomplicated: F17.200

## 2019-07-31 HISTORY — PX: BREAST LUMPECTOMY WITH RADIOACTIVE SEED LOCALIZATION: SHX6424

## 2019-07-31 HISTORY — DX: Unspecified asthma, uncomplicated: J45.909

## 2019-07-31 LAB — GLUCOSE, CAPILLARY
Glucose-Capillary: 109 mg/dL — ABNORMAL HIGH (ref 70–99)
Glucose-Capillary: 125 mg/dL — ABNORMAL HIGH (ref 70–99)

## 2019-07-31 SURGERY — BREAST LUMPECTOMY WITH RADIOACTIVE SEED LOCALIZATION
Anesthesia: General | Site: Breast | Laterality: Bilateral

## 2019-07-31 MED ORDER — CHLORHEXIDINE GLUCONATE CLOTH 2 % EX PADS: 6.0000 | MEDICATED_PAD | Freq: Once | CUTANEOUS | Status: DC

## 2019-07-31 MED ORDER — PROPOFOL 10 MG/ML IV BOLUS
INTRAVENOUS | Status: DC | PRN
  Administered 2019-07-31: 200 mg via INTRAVENOUS

## 2019-07-31 MED ORDER — FENTANYL CITRATE (PF) 100 MCG/2ML IJ SOLN: 25.0000 ug | INTRAMUSCULAR | Status: DC | PRN

## 2019-07-31 MED ORDER — MIDAZOLAM HCL 5 MG/5ML IJ SOLN
INTRAMUSCULAR | Status: DC | PRN
  Administered 2019-07-31 (×2): 1 mg via INTRAVENOUS

## 2019-07-31 MED ORDER — OXYCODONE HCL 5 MG/5ML PO SOLN: 5.0000 mg | Freq: Once | ORAL | Status: AC | PRN

## 2019-07-31 MED ORDER — OXYCODONE HCL 5 MG PO TABS
5.0000 mg | ORAL_TABLET | Freq: Once | ORAL | Status: AC | PRN
  Administered 2019-07-31: 5 mg via ORAL

## 2019-07-31 MED ORDER — ACETAMINOPHEN 500 MG PO TABS
ORAL_TABLET | ORAL | Status: AC
  Filled 2019-07-31: qty 2

## 2019-07-31 MED ORDER — PROMETHAZINE HCL 25 MG/ML IJ SOLN: 6.2500 mg | INTRAMUSCULAR | Status: DC | PRN

## 2019-07-31 MED ORDER — LACTATED RINGERS IV SOLN: INTRAVENOUS | Status: DC

## 2019-07-31 MED ORDER — EPHEDRINE 5 MG/ML INJ
INTRAVENOUS | Status: AC
  Filled 2019-07-31: qty 10

## 2019-07-31 MED ORDER — CEFAZOLIN SODIUM-DEXTROSE 2-4 GM/100ML-% IV SOLN
INTRAVENOUS | Status: AC
  Filled 2019-07-31: qty 100

## 2019-07-31 MED ORDER — DEXTROSE 5 % IV SOLN
3.0000 g | INTRAVENOUS | Status: AC
  Administered 2019-07-31: 3 g via INTRAVENOUS

## 2019-07-31 MED ORDER — CEFAZOLIN SODIUM-DEXTROSE 1-4 GM/50ML-% IV SOLN
INTRAVENOUS | Status: AC
  Filled 2019-07-31: qty 50

## 2019-07-31 MED ORDER — FENTANYL CITRATE (PF) 100 MCG/2ML IJ SOLN
INTRAMUSCULAR | Status: DC | PRN
  Administered 2019-07-31 (×2): 25 ug via INTRAVENOUS
  Administered 2019-07-31: 50 ug via INTRAVENOUS

## 2019-07-31 MED ORDER — GABAPENTIN 300 MG PO CAPS: 300.0000 mg | ORAL_CAPSULE | ORAL | Status: DC

## 2019-07-31 MED ORDER — LIDOCAINE HCL (CARDIAC) PF 100 MG/5ML IV SOSY
PREFILLED_SYRINGE | INTRAVENOUS | Status: DC | PRN
  Administered 2019-07-31: 100 mg via INTRAVENOUS

## 2019-07-31 MED ORDER — HYDROCODONE-ACETAMINOPHEN 5-325 MG PO TABS: 1.0000 | ORAL_TABLET | Freq: Four times a day (QID) | ORAL | 0 refills | Status: DC | PRN

## 2019-07-31 MED ORDER — ONDANSETRON HCL 4 MG/2ML IJ SOLN
INTRAMUSCULAR | Status: DC | PRN
  Administered 2019-07-31: 4 mg via INTRAVENOUS

## 2019-07-31 MED ORDER — ACETAMINOPHEN 500 MG PO TABS
1000.0000 mg | ORAL_TABLET | ORAL | Status: AC
  Administered 2019-07-31: 1000 mg via ORAL

## 2019-07-31 MED ORDER — FENTANYL CITRATE (PF) 100 MCG/2ML IJ SOLN
INTRAMUSCULAR | Status: AC
  Filled 2019-07-31: qty 2

## 2019-07-31 MED ORDER — EPHEDRINE SULFATE 50 MG/ML IJ SOLN
INTRAMUSCULAR | Status: DC | PRN
  Administered 2019-07-31 (×2): 15 mg via INTRAVENOUS
  Administered 2019-07-31 (×2): 10 mg via INTRAVENOUS

## 2019-07-31 MED ORDER — OXYCODONE HCL 5 MG PO TABS
ORAL_TABLET | ORAL | Status: AC
  Filled 2019-07-31: qty 1

## 2019-07-31 MED ORDER — BUPIVACAINE HCL (PF) 0.25 % IJ SOLN
INTRAMUSCULAR | Status: DC | PRN
  Administered 2019-07-31: 30 mL

## 2019-07-31 MED ORDER — MIDAZOLAM HCL 2 MG/2ML IJ SOLN
INTRAMUSCULAR | Status: AC
  Filled 2019-07-31: qty 2

## 2019-07-31 SURGICAL SUPPLY — 42 items
ADH SKN CLS APL DERMABOND .7 (GAUZE/BANDAGES/DRESSINGS) ×1
APL PRP STRL LF DISP 70% ISPRP (MISCELLANEOUS) ×1
APPLIER CLIP 9.375 MED OPEN (MISCELLANEOUS) ×2
APR CLP MED 9.3 20 MLT OPN (MISCELLANEOUS) ×1
BLADE SURG 15 STRL LF DISP TIS (BLADE) ×1 IMPLANT
BLADE SURG 15 STRL SS (BLADE) ×2
CANISTER SUC SOCK COL 7IN (MISCELLANEOUS) ×2 IMPLANT
CANISTER SUCT 1200ML W/VALVE (MISCELLANEOUS) ×2 IMPLANT
CHLORAPREP W/TINT 26 (MISCELLANEOUS) ×2 IMPLANT
CLIP APPLIE 9.375 MED OPEN (MISCELLANEOUS) ×1 IMPLANT
COVER BACK TABLE 60X90IN (DRAPES) ×2 IMPLANT
COVER MAYO STAND STRL (DRAPES) ×2 IMPLANT
COVER PROBE W GEL 5X96 (DRAPES) ×2 IMPLANT
COVER WAND RF STERILE (DRAPES) IMPLANT
DECANTER SPIKE VIAL GLASS SM (MISCELLANEOUS) IMPLANT
DERMABOND ADVANCED (GAUZE/BANDAGES/DRESSINGS) ×1
DERMABOND ADVANCED .7 DNX12 (GAUZE/BANDAGES/DRESSINGS) ×1 IMPLANT
DRAPE LAPAROSCOPIC ABDOMINAL (DRAPES) ×2 IMPLANT
DRAPE UTILITY XL STRL (DRAPES) ×2 IMPLANT
ELECT COATED BLADE 2.86 ST (ELECTRODE) ×2 IMPLANT
ELECT REM PT RETURN 9FT ADLT (ELECTROSURGICAL) ×2
ELECTRODE REM PT RTRN 9FT ADLT (ELECTROSURGICAL) ×1 IMPLANT
GLOVE BIO SURGEON STRL SZ7.5 (GLOVE) ×4 IMPLANT
GOWN STRL REUS W/ TWL LRG LVL3 (GOWN DISPOSABLE) ×2 IMPLANT
GOWN STRL REUS W/TWL LRG LVL3 (GOWN DISPOSABLE) ×4
ILLUMINATOR WAVEGUIDE N/F (MISCELLANEOUS) IMPLANT
KIT MARKER MARGIN INK (KITS) ×2 IMPLANT
LIGHT WAVEGUIDE WIDE FLAT (MISCELLANEOUS) IMPLANT
NEEDLE HYPO 25X1 1.5 SAFETY (NEEDLE) IMPLANT
NS IRRIG 1000ML POUR BTL (IV SOLUTION) IMPLANT
PACK BASIN DAY SURGERY FS (CUSTOM PROCEDURE TRAY) ×2 IMPLANT
PENCIL SMOKE EVACUATOR (MISCELLANEOUS) ×2 IMPLANT
SLEEVE SCD COMPRESS KNEE MED (MISCELLANEOUS) ×2 IMPLANT
SPONGE LAP 18X18 RF (DISPOSABLE) ×2 IMPLANT
SUT MON AB 4-0 PC3 18 (SUTURE) ×2 IMPLANT
SUT SILK 2 0 SH (SUTURE) IMPLANT
SUT VICRYL 3-0 CR8 SH (SUTURE) ×2 IMPLANT
SYR CONTROL 10ML LL (SYRINGE) IMPLANT
TOWEL GREEN STERILE FF (TOWEL DISPOSABLE) ×2 IMPLANT
TRAY FAXITRON CT DISP (TRAY / TRAY PROCEDURE) ×2 IMPLANT
TUBE CONNECTING 20X1/4 (TUBING) IMPLANT
YANKAUER SUCT BULB TIP NO VENT (SUCTIONS) IMPLANT

## 2019-07-31 NOTE — Transfer of Care (Signed)
Immediate Anesthesia Transfer of Care Note  Patient: Caitlin Rivera  Procedure(s) Performed: BILATERAL BREAST LUMPECTOMY WITH RADIOACTIVE SEED LOCALIZATION (Bilateral Breast)  Patient Location: PACU  Anesthesia Type:General  Level of Consciousness: awake  Airway & Oxygen Therapy: Patient Spontanous Breathing and Patient connected to face mask oxygen  Post-op Assessment: Report given to RN and Post -op Vital signs reviewed and stable  Post vital signs: Reviewed and stable  Last Vitals:  Vitals Value Taken Time  BP 98/62 07/31/19 1353  Temp 36.6 C 07/31/19 1353  Pulse 82 07/31/19 1354  Resp 18 07/31/19 1354  SpO2 98 % 07/31/19 1354  Vitals shown include unvalidated device data.  Last Pain:  Vitals:   07/31/19 1353  TempSrc:   PainSc: Asleep         Complications: No complications documented.

## 2019-07-31 NOTE — H&P (Signed)
Caitlin Rivera  Location: [facility] Patient #: [id number] DOB: 1955/06/29 Widowed / Language: Cleophus Molt / Race: Black or African American Female   History of Present Illness  The patient is a 64 year old female who presents with breast cancer. We are asked to see the patient in consultation by Dr. Vista Lawman to evaluate her for a new left breast cancer. The patient is a 64 year old black female who recently went for a routine screening mammogram. At that time she was found to have 2 small areas of distortion, one in each breast. 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 She denies any family history of breast cancer. She does have asthma and smokes a half pack cigarettes a day and has diabetes.   Past Surgical History  Breast Biopsy  Bilateral.  Diagnostic Studies History  Colonoscopy  never Mammogram  within last year Pap Smear  1-5 years ago  Allergies Ibuprofen *ANALGESICS - ANTI-INFLAMMATORY*  Morphine Sulfate *ANALGESICS - OPIOID*    Medication History tiZANidine HCl (4MG  Tablet, Oral) Active. Gabapentin (400MG  Capsule, Oral) Active. Meloxicam (15MG  Tablet, Oral) Active. Symbicort (160-4.5MCG/ACT Aerosol, Inhalation) Active. Albuterol Sulfate HFA (108 (90 Base)MCG/ACT Aerosol Soln, Inhalation) Active. amLODIPine Besylate (5MG  Tablet, Oral) Active. metFORMIN HCl (500MG  Tablet, Oral) Active. Rosuvastatin Calcium (20MG  Tablet, Oral) Active. Medications Reconciled  Social History Alcohol use  Occasional alcohol use. Illicit drug use  Uses monthly. No caffeine use  Tobacco use  Current every day smoker.  Family History  Arthritis  Father. Cancer  Mother. Cerebrovascular Accident  Daughter. Depression  Daughter. Diabetes Mellitus  Father. Heart Disease  Father. Hypertension  Daughter, Father. Migraine  Headache  Daughter.  Pregnancy / Birth History Age at menarche  68 years. Age of menopause  78-62 Contraceptive History  Oral contraceptives. Gravida  4 Irregular periods  Maternal age  81-20 Para  3  Other Problems Asthma  Chronic Obstructive Lung Disease  Diabetes Mellitus  Hypercholesterolemia  Migraine Headache  Transfusion history     Review of Systems General Not Present- Appetite Loss, Chills, Fatigue, Fever, Night Sweats, Weight Gain and Weight Loss. Skin Not Present- Change in Wart/Mole, Dryness, Hives, Jaundice, New Lesions, Non-Healing Wounds, Rash and Ulcer. HEENT Present- Nose Bleed, Ringing in the Ears and Wears glasses/contact lenses. Not Present- Earache, Hearing Loss, Hoarseness, Oral Ulcers, Seasonal Allergies, Sinus Pain, Sore Throat, Visual Disturbances and Yellow Eyes. Breast Present- Breast Pain. Not Present- Breast Mass, Nipple Discharge and Skin Changes. Cardiovascular Present- Swelling of Extremities. Not Present- Chest Pain, Difficulty Breathing Lying Down, Leg Cramps, Palpitations, Rapid Heart Rate and Shortness of Breath. Gastrointestinal Not Present- Abdominal Pain, Bloating, Bloody Stool, Change in Bowel Habits, Chronic diarrhea, Constipation, Difficulty Swallowing, Excessive gas, Gets full quickly at meals, Hemorrhoids, Indigestion, Nausea, Rectal Pain and Vomiting. Female Genitourinary Not Present- Frequency, Nocturia, Painful Urination, Pelvic Pain and Urgency. Neurological Present- Headaches. Not Present- Decreased Memory, Fainting, Numbness, Seizures, Tingling, Tremor, Trouble walking and Weakness. Psychiatric Not Present- Anxiety, Bipolar, Change in Sleep Pattern, Depression, Fearful and Frequent crying. Endocrine Present- Hot flashes and New Diabetes. Not Present- Cold Intolerance, Excessive Hunger, Hair Changes and Heat Intolerance. Hematology Not Present- Blood Thinners, Easy Bruising, Excessive bleeding, Gland problems, HIV and  Persistent Infections.  Vitals Weight: 281 lb Height: 67in Body Surface Area: 2.34 m Body Mass Index: 44.01 kg/m  Temp.: 98.71F  Pulse: 101 (Regular)  BP: 136/72(Sitting, Left  Arm, Standard)       Physical Exam General Mental Status-Alert. General Appearance-Consistent with stated age. Hydration-Well hydrated. Voice-Normal.  Head and Neck Head-normocephalic, atraumatic with no lesions or palpable masses. Trachea-midline. Thyroid Gland Characteristics - normal size and consistency.  Eye Eyeball - Bilateral-Extraocular movements intact. Sclera/Conjunctiva - Bilateral-No scleral icterus.  Chest and Lung Exam Chest and lung exam reveals -quiet, even and easy respiratory effort with no use of accessory muscles and on auscultation, normal breath sounds, no adventitious sounds and normal vocal resonance. Inspection Chest Wall - Normal. Back - normal.  Breast Note: There is no palpable mass in the right breast. There is a palpable area of redness and induration in the medial left breast at the biopsy site. There is no palpable axillary, supraclavicular, or cervical lymphadenopathy.   Cardiovascular Cardiovascular examination reveals -normal heart sounds, regular rate and rhythm with no murmurs and normal pedal pulses bilaterally.  Abdomen Inspection Inspection of the abdomen reveals - No Hernias. Skin - Scar - no surgical scars. Palpation/Percussion Palpation and Percussion of the abdomen reveal - Soft, Non Tender, No Rebound tenderness, No Rigidity (guarding) and No hepatosplenomegaly. Auscultation Auscultation of the abdomen reveals - Bowel sounds normal.  Neurologic Neurologic evaluation reveals -alert and oriented x 3 with no impairment of recent or remote memory. Mental Status-Normal.  Musculoskeletal Normal Exam - Left-Upper Extremity Strength Normal and Lower Extremity Strength Normal. Normal Exam - Right-Upper  Extremity Strength Normal and Lower Extremity Strength Normal.  Lymphatic Head & Neck  General Head & Neck Lymphatics: Bilateral - Description - Normal. Axillary  General Axillary Region: Bilateral - Description - Normal. Tenderness - Non Tender. Femoral & Inguinal  Generalized Femoral & Inguinal Lymphatics: Bilateral - Description - Normal. Tenderness - Non Tender.    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 I have discussed with her in detail the different options for treatment of the DCIS and at this point she is leaning towards mastectomy. She will need to talk to her family about this before making a final decision. She will also need a lumpectomy of the sclerosing papillary lesion because of the small risk of missing something more significant. I have discussed with her in detail the risks and benefits of the operations as well as some of the technical aspects and she understands. She will let us know when she makes a final decision. I will go ahead and make a referral to medical and radiation oncology. This patient encounter took 60 minutes today to perform the following: take history, perform exam, review outside records, interpret imaging, counsel the patient on their diagnosis and document encounter, findings & plan in the EHR. She also appears to have a early infection potentially at the biopsy site on the left. I will start her on doxycycline and we will monitor this. Current Plans Referred to Oncology, for evaluation and follow up (Oncology). Routine. Pt Education - Breast Cancer: discussed with patient and provided information. Started Doxycycline Hyclate 100 MG Oral Capsule, 1 (one) Capsule two times daily, #20, 06/19/2019, Ref. x1.

## 2019-07-31 NOTE — Anesthesia Preprocedure Evaluation (Addendum)
Anesthesia Evaluation  Patient identified by MRN, date of birth, ID band Patient awake    Reviewed: Allergy & Precautions, NPO status , Patient's Chart, lab work & pertinent test results  History of Anesthesia Complications Negative for: history of anesthetic complications  Airway Mallampati: II  TM Distance: >3 FB Neck ROM: Full    Dental  (+) Missing, Poor Dentition,    Pulmonary asthma , Current Smoker and Patient abstained from smoking.,    Pulmonary exam normal        Cardiovascular hypertension, Pt. on medications Normal cardiovascular exam     Neuro/Psych negative neurological ROS  negative psych ROS   GI/Hepatic negative GI ROS, Neg liver ROS,   Endo/Other  diabetes, Type 2, Oral Hypoglycemic AgentsMorbid obesity  Renal/GU negative Renal ROS  negative genitourinary   Musculoskeletal negative musculoskeletal ROS (+)   Abdominal   Peds  Hematology negative hematology ROS (+)   Anesthesia Other Findings RIGHT BREAST PAPILLOMA, LEFT BREAST DUCTAL CARCINOMA IN SITU  Reproductive/Obstetrics negative OB ROS                            Anesthesia Physical Anesthesia Plan  ASA: III  Anesthesia Plan: General   Post-op Pain Management:    Induction: Intravenous  PONV Risk Score and Plan: 3 and Treatment may vary due to age or medical condition, Midazolam, Ondansetron and Dexamethasone  Airway Management Planned: LMA  Additional Equipment: None  Intra-op Plan:   Post-operative Plan: Extubation in OR  Informed Consent: I have reviewed the patients History and Physical, chart, labs and discussed the procedure including the risks, benefits and alternatives for the proposed anesthesia with the patient or authorized representative who has indicated his/her understanding and acceptance.     Dental advisory given  Plan Discussed with: CRNA  Anesthesia Plan Comments:         Anesthesia Quick Evaluation

## 2019-07-31 NOTE — Interval H&P Note (Signed)
History and Physical Interval Note:  07/31/2019 12:06 PM  Caitlin Rivera  has presented today for surgery, with the diagnosis of RIGHT BREAST PAPILLOMA, LEFT BREAST DUCTAL CARCINOMA IN SITU.  The various methods of treatment have been discussed with the patient and family. After consideration of risks, benefits and other options for treatment, the patient has consented to  Procedure(s): BILATERAL BREAST LUMPECTOMY WITH RADIOACTIVE SEED LOCALIZATION (Bilateral) as a surgical intervention.  The patient's history has been reviewed, patient examined, no change in status, stable for surgery.  I have reviewed the patient's chart and labs.  Questions were answered to the patient's satisfaction.     Autumn Messing III

## 2019-07-31 NOTE — Anesthesia Procedure Notes (Signed)
Procedure Name: LMA Insertion Performed by: Kaileb Monsanto, [place], CRNA Pre-anesthesia Checklist: Patient identified, Emergency Drugs available, Suction available and Patient being monitored Patient Re-evaluated:Patient Re-evaluated prior to induction Oxygen Delivery Method: Circle system utilized Preoxygenation: Pre-oxygenation with 100% oxygen Induction Type: IV induction Ventilation: Mask ventilation without difficulty LMA: LMA inserted LMA Size: 4.0 Number of attempts: 1 Airway Equipment and Method: Bite block Placement Confirmation: positive ETCO2 Tube secured with: Tape Dental Injury: Teeth and Oropharynx as per pre-operative assessment        

## 2019-07-31 NOTE — Discharge Instructions (Signed)
No Tylenol until 5:20 pm.  Post Anesthesia Home Care Instructions  Activity: Get plenty of rest for the remainder of the day. A responsible individual must stay with you for 24 hours following the procedure.  For the next 24 hours, DO NOT: -Drive a car -Paediatric nurse -Drink alcoholic beverages -Take any medication unless instructed by your physician -Make any legal decisions or sign important papers.  Meals: Start with liquid foods such as gelatin or soup. Progress to regular foods as tolerated. Avoid greasy, spicy, heavy foods. If nausea and/or vomiting occur, drink only clear liquids until the nausea and/or vomiting subsides. Call your physician if vomiting continues.  Special Instructions/Symptoms: Your throat may feel dry or sore from the anesthesia or the breathing tube placed in your throat during surgery. If this causes discomfort, gargle with warm salt water. The discomfort should disappear within 24 hours.  If you had a scopolamine patch placed behind your ear for the management of post- operative nausea and/or vomiting:  1. The medication in the patch is effective for 72 hours, after which it should be removed.  Wrap patch in a tissue and discard in the trash. Wash hands thoroughly with soap and water. 2. You may remove the patch earlier than 72 hours if you experience unpleasant side effects which may include dry mouth, dizziness or visual disturbances. 3. Avoid touching the patch. Wash your hands with soap and water after contact with the patch.

## 2019-07-31 NOTE — Anesthesia Postprocedure Evaluation (Signed)
Anesthesia Post Note  Patient: Caitlin Rivera  Procedure(s) Performed: BILATERAL BREAST LUMPECTOMY WITH RADIOACTIVE SEED LOCALIZATION (Bilateral Breast)     Patient location during evaluation: PACU Anesthesia Type: General Level of consciousness: awake and alert and oriented Pain management: pain level controlled Vital Signs Assessment: post-procedure vital signs reviewed and stable Respiratory status: spontaneous breathing, nonlabored ventilation and respiratory function stable Cardiovascular status: blood pressure returned to baseline Postop Assessment: no apparent nausea or vomiting Anesthetic complications: no   No complications documented.  Last Vitals:  Vitals:   07/31/19 1430 07/31/19 1502  BP:  96/70  Pulse: 74 78  Resp: 14 14  Temp:  36.6 C  SpO2: 95% 94%    Last Pain:  Vitals:   07/31/19 1502  TempSrc:   PainSc: [place]

## 2019-07-31 NOTE — Op Note (Signed)
07/31/2019  1:43 PM  PATIENT:  Caitlin Rivera  64 y.o. female  PRE-OPERATIVE DIAGNOSIS:  RIGHT BREAST PAPILLOMA, LEFT BREAST DUCTAL CARCINOMA IN SITU  POST-OPERATIVE DIAGNOSIS:  RIGHT BREAST PAPILLOMA, LEFT BREAST DUCTAL CARCINOMA IN SITU  PROCEDURE:  Procedure(s): BILATERAL BREAST LUMPECTOMY WITH RADIOACTIVE SEED LOCALIZATION (Bilateral)  SURGEON:  Surgeon(s) and Role:    * Jovita Kussmaul, MD - Primary  PHYSICIAN ASSISTANT:   ASSISTANTS: none   ANESTHESIA:   local and general  EBL:  minimal   BLOOD ADMINISTERED:none  DRAINS: none   LOCAL MEDICATIONS USED:  MARCAINE     SPECIMEN:  Source of Specimen:  right and left breast tissue with additional left breast inferior margin  DISPOSITION OF SPECIMEN:  PATHOLOGY  COUNTS:  YES  TOURNIQUET:  * No tourniquets in log *  DICTATION: .Dragon Dictation   After informed consent was obtained the patient was brought to the operating room and placed in the supine position on the operating table.  After adequate induction of general anesthesia the patient's bilateral chest, breast, and axillary areas were prepped with ChloraPrep, allowed to dry, and draped in usual sterile manner.  An appropriate timeout was performed.  Previously an I-125 seed was placed in the right medial breast to mark an area of complex sclerosing lesion.  Also previously an I-125 seed was placed in the medial aspect of the left breast to mark an area of ductal carcinoma in situ.  The neoprobe was set to I-125 and attention was first turned to the right breast.  The area of radioactivity was readily identified.  The area around this was infiltrated with quarter percent Marcaine.  A curvilinear incision was made along the inner edge of the areola of the right breast with a fifteen blade knife.  The incision was carried through the skin and subcutaneous tissue sharply with the electrocautery.  A circular portion of breast tissue was then excised sharply around the  radioactive seed while checking the area of radioactivity frequently.  This was very superficial behind the areola.  Once the specimen was removed it was oriented with the appropriate paint colors.  A specimen radiograph was obtained that showed the clip and seed to be near the center of the specimen.  The specimen was then sent to pathology for further evaluation.  Hemostasis was achieved using the Bovie electrocautery.  The wound was irrigated with saline and infiltrated with more quarter percent Marcaine.  The deep layer of the wound was then closed with interrupted 3-0 Vicryl stitches.  The skin was then closed with interrupted 4-0 Monocryl subcuticular stitches.  Attention was then turned to the left breast.  The area of radioactivity was readily identified.  The area around this was infiltrated with quarter percent Marcaine.  Because of the larger nature of the ductal carcinoma in situ and because of the location of hard scar tissue along her biopsy and seed tract I elected to make a radial oriented elliptical incision in the skin overlying the area of radioactivity.  The incision was carried through the skin and subcutaneous tissue sharply with the electrocautery.  Dissection was then carried around the radioactive seed while checking the area of radioactivity frequently.  Once the specimen was removed it was oriented with the appropriate paint colors.  A specimen radiograph was obtained that showed the clip and seed to be near the center of the specimen.  I did think we might be a little bit close on the inferior edge so an  additional inferior edge was taken sharply with the electrocautery and marked appropriately.  All of this tissue was then sent to pathology for further evaluation.  Hemostasis was achieved using the Bovie electrocautery.  The wound was irrigated with saline and infiltrated with more quarter percent Marcaine.  The deep layer of the wound was then closed with layers of interrupted 3-0  Vicryl stitches.  The skin was closed with a running 4-0 Monocryl subcuticular stitch.  Dermabond dressings were applied.  The patient tolerated the procedure well.  At the end of the case all needle sponge and instrument counts were correct.  The patient was then awakened and taken to recovery in stable condition.  PLAN OF CARE: Discharge to home after PACU  PATIENT DISPOSITION:  PACU - hemodynamically stable.   Delay start of Pharmacological VTE agent (>24hrs) due to surgical blood loss or risk of bleeding: not applicable

## 2019-08-01 ENCOUNTER — Encounter (HOSPITAL_BASED_OUTPATIENT_CLINIC_OR_DEPARTMENT_OTHER): Payer: Self-pay | Admitting: General Surgery

## 2019-08-02 LAB — SURGICAL PATHOLOGY

## 2019-08-06 ENCOUNTER — Encounter: Payer: Self-pay | Admitting: *Deleted

## 2019-08-22 ENCOUNTER — Ambulatory Visit
Admission: RE | Admit: 2019-08-22 | Discharge: 2019-08-22 | Disposition: A | Discharge status: Home / Self Care | Payer: Medicaid Other | Source: Ambulatory Visit | Attending: Radiation Oncology | Admitting: Radiation Oncology

## 2019-08-22 ENCOUNTER — Encounter: Payer: Self-pay | Admitting: *Deleted

## 2019-08-22 ENCOUNTER — Encounter: Payer: Self-pay | Admitting: Licensed Clinical Social Worker

## 2019-08-22 ENCOUNTER — Other Ambulatory Visit: Payer: Self-pay

## 2019-08-22 ENCOUNTER — Encounter: Payer: Self-pay | Admitting: Radiation Oncology

## 2019-08-22 VITALS — BP 137/66 | HR 68 | Temp 98.2°F | Resp 18 | Ht 66.5 in | Wt 276.0 lb

## 2019-08-22 DIAGNOSIS — Z7984 Long term (current) use of oral hypoglycemic drugs: Secondary | ICD-10-CM | POA: Diagnosis not present

## 2019-08-22 DIAGNOSIS — M549 Dorsalgia, unspecified: Secondary | ICD-10-CM | POA: Insufficient documentation

## 2019-08-22 DIAGNOSIS — Z79899 Other long term (current) drug therapy: Secondary | ICD-10-CM | POA: Diagnosis not present

## 2019-08-22 DIAGNOSIS — J449 Chronic obstructive pulmonary disease, unspecified: Secondary | ICD-10-CM | POA: Diagnosis not present

## 2019-08-22 DIAGNOSIS — Z170 Estrogen receptor positive status [ER+]: Secondary | ICD-10-CM | POA: Diagnosis not present

## 2019-08-22 DIAGNOSIS — F17210 Nicotine dependence, cigarettes, uncomplicated: Secondary | ICD-10-CM | POA: Diagnosis not present

## 2019-08-22 DIAGNOSIS — E119 Type 2 diabetes mellitus without complications: Secondary | ICD-10-CM | POA: Diagnosis not present

## 2019-08-22 DIAGNOSIS — C50312 Malignant neoplasm of lower-inner quadrant of left female breast: Secondary | ICD-10-CM

## 2019-08-22 DIAGNOSIS — I10 Essential (primary) hypertension: Secondary | ICD-10-CM | POA: Insufficient documentation

## 2019-08-22 DIAGNOSIS — Z510 Encounter for antineoplastic radiation therapy: Secondary | ICD-10-CM | POA: Insufficient documentation

## 2019-08-22 NOTE — Progress Notes (Signed)
Radiation Oncology         [phone] ________________________________   Name: Caitlin Rivera        MRN: [id number]  Date of Service: 08/22/2019 DOB: October 10, 1955  RA:[id number], Iona Beard, MD  Magrinat, Virgie Dad, MD     REFERRING PHYSICIAN: Magrinat, Virgie Dad, MD   DIAGNOSIS: The encounter diagnosis was Malignant neoplasm of lower-inner quadrant of left breast in female, estrogen receptor positive ([place]).   HISTORY OF PRESENT ILLNESS: Caitlin Rivera is a 64 y.o. female  with left breast cancer. The patient was noted to have a screening detected asymmetry in the left breast as well as a possible mass in the right breast.  She underwent further diagnostic imaging which revealed no sonographic correlate to the asymmetry on mammography in the left breast but did show a right breast mass measuring 7 x 4 x 5 mm at the 3 o'clock position, bilateral axilla were negative for adenopathy.  She underwent biopsies of each site on 06/03/2019.  The right needle biopsy at 3:00 revealed a sclerosing papillary lesion.  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  She proceeded with bilateral lumpectomies on 07/31/19 which revealed an intraductal papilloma in the right breast without malignancy.  Her left lumpectomy showed no residual carcinoma and extensive biopsy site reaction, additional margin of the left breast was also negative.  She comes today to discuss options of adjuvant radiotherapy to the left breast.  PREVIOUS RADIATION THERAPY: No   PAST MEDICAL HISTORY:  Past Medical History:  Diagnosis Date  . Asthma   . Cancer ([place]) 06/2019   left breast DCIS  . COPD (chronic obstructive  pulmonary disease) ([place])    smoker  . Diabetes mellitus without complication ([place])   . Fibroids   . Hypertension   . Smoker        PAST SURGICAL HISTORY: Past Surgical History:  Procedure Laterality Date  . BREAST LUMPECTOMY WITH RADIOACTIVE SEED LOCALIZATION Bilateral 07/31/2019   Procedure: BILATERAL BREAST LUMPECTOMY WITH RADIOACTIVE SEED LOCALIZATION;  Surgeon: Jovita Kussmaul, MD;  Location: [facility];  Service: General;  Laterality: Bilateral;  . TUBAL LIGATION    . TUBAL LIGATION       FAMILY HISTORY:  Family History  Problem Relation Age of Onset  . Cancer Mother      SOCIAL HISTORY:  reports that she has been smoking cigarettes. She has been smoking about 0.50 packs per day. She has never used smokeless tobacco. She reports current drug use. Drug: "Crack" cocaine. She reports that she does not drink alcohol. The patient is widowed and lives in [place].  ALLERGIES: Morphine and related and Ibuprofen   MEDICATIONS:  Current Outpatient Medications  Medication Sig Dispense Refill  . albuterol (VENTOLIN HFA) 108 (90 Base) MCG/ACT inhaler     . amLODipine (NORVASC) 5 MG tablet Take 5 mg by mouth daily.    Marland Kitchen amoxicillin (AMOXIL) 500 MG tablet Take 500 mg by mouth 3 (three) times daily.    Marland Kitchen gabapentin (NEURONTIN) 400 MG capsule Take 400 mg by mouth 3 (three) times daily.    . metFORMIN (GLUCOPHAGE) 500 MG tablet Take 500 mg by mouth 2 (two) times daily.    Marland Kitchen OVER THE  COUNTER MEDICATION Take 1-2 tablets by mouth 2 (two) times daily as needed (for pain). OTC Pain Reliever PM    . rosuvastatin (CRESTOR) 20 MG tablet Take 20 mg by mouth daily.    . SYMBICORT 160-4.5 MCG/ACT inhaler     . tiZANidine (ZANAFLEX) 4 MG tablet Take 4 mg by mouth every 8 (eight) hours as needed.    Marland Kitchen HYDROcodone-acetaminophen (NORCO/VICODIN) 5-325 MG tablet Take 1-2 tablets by mouth every 6 (six) hours as needed for moderate pain or severe pain. (Patient not taking: Reported on  08/22/2019) 15 tablet 0   No current facility-administered medications for this encounter.     REVIEW OF SYSTEMS: On review of systems, the patient reports that she is doing well overall.  She does have chronic back pain issues, and uses a cane to mobilize.  She denies any chest pain, shortness of breath, cough, fevers, chills, night sweats, unintended weight changes. She denies any bowel or bladder disturbances, and denies abdominal pain, nausea or vomiting. She denies any new musculoskeletal or joint aches or pains. A complete review of systems is obtained and is otherwise negative.     PHYSICAL EXAM:  Wt Readings from Last 3 Encounters:  08/22/19 276 lb (125.2 kg)  07/31/19 275 lb 9.2 oz (125 kg)  07/02/19 278 lb 8 oz (126.3 kg)   Temp Readings from Last 3 Encounters:  08/22/19 98.2 F (36.8 C) (Oral)  07/31/19 97.8 F (36.6 C)  07/02/19 98.5 F (36.9 C) (Temporal)   BP Readings from Last 3 Encounters:  08/22/19 137/66  07/31/19 96/70  07/02/19 133/70   Pulse Readings from Last 3 Encounters:  08/22/19 68  07/31/19 78  07/02/19 79   In general this is a chronically debilitated appearing African-American female in no acute distress.  She's alert and oriented x4 and appropriate throughout the examination. Cardiopulmonary assessment is negative for acute distress and she exhibits normal effort.  Her bilateral breast incisions are assessed and are intact without evidence of erythema or cellulitic streaking.  No fluctuance is identified.   ECOG = 0  0 - Asymptomatic (Fully active, able to carry on all predisease activities without restriction)  1 - Symptomatic but completely ambulatory (Restricted in physically strenuous activity but ambulatory and able to carry out work of a light or sedentary nature. For example, light housework, office work)  2 - Symptomatic, <50% in bed during the day (Ambulatory and capable of all self care but unable to carry out any work activities. Up  and about more than 50% of waking hours)  3 - Symptomatic, >50% in bed, but not bedbound (Capable of only limited self-care, confined to bed or chair 50% or more of waking hours)  4 - Bedbound (Completely disabled. Cannot carry on any self-care. Totally confined to bed or chair)  5 - Death   Eustace Pen MM, Creech RH, Tormey DC, et al. [phone]). "Toxicity and response criteria of the [facility]". [place] Oncol. 5 (6): 649-55    LABORATORY DATA:  Lab Results  Component Value Date   WBC 8.7 07/02/2019   HGB 15.7 (H) 07/02/2019   HCT 50.0 (H) 07/02/2019   MCV 94.5 07/02/2019   PLT 253 07/02/2019   Lab Results  Component Value Date   NA 140 07/29/2019   K 5.2 (H) 07/29/2019   CL 103 07/29/2019   CO2 26 07/29/2019   Lab Results  Component Value Date   ALT 17 07/02/2019   AST 13 (L)  07/02/2019   ALKPHOS 92 07/02/2019   BILITOT 0.5 07/02/2019      RADIOGRAPHY: MM Breast Surgical Specimen  Result Date: 07/31/2019 CLINICAL DATA:  Evaluate specimen EXAM: SPECIMEN RADIOGRAPH OF THE LEFT BREAST COMPARISON:  Previous exam(s). FINDINGS: Status post excision of the left breast. The radioactive seed and biopsy marker clip are present, completely intact, and were marked for pathology. IMPRESSION: Specimen radiograph of the left breast. Electronically Signed   By: Dorise Bullion III M.D   On: 07/31/2019 13:31   MM Breast Surgical Specimen  Result Date: 07/31/2019 CLINICAL DATA:  Surgical excision of a recently biopsied right breast sclerosing papillary lesion. EXAM: SPECIMEN RADIOGRAPH OF THE RIGHT BREAST COMPARISON:  Previous exam(s). FINDINGS: Status post excision of the right breast. The radioactive seed and coil shaped biopsy marker clip are present, completely intact. IMPRESSION: Specimen radiograph of the right breast. Electronically Signed   By: Claudie Revering M.D.   On: 07/31/2019 13:12   MM LT RADIOACTIVE SEED LOC MAMMO GUIDE  Result Date: 07/30/2019 CLINICAL  DATA:  64 year old female for radioactive seed localization of LEFT breast cancer and radioactive seed localization of RIGHT breast sclerosing papillary lesion. EXAM: MAMMOGRAPHIC GUIDED RADIOACTIVE SEED LOCALIZATION OF THE LEFT BREAST MAMMOGRAPHIC GUIDED RADIOACTIVE SEED LOCALIZATION OF THE RIGHT BREAST COMPARISON:  Previous exam(s). FINDINGS: Patient presents for radioactive seed localizations prior to LEFT lumpectomy and RIGHT breast excision. I met with the patient and we discussed the procedures of seed localization including benefits and alternatives. We discussed the high likelihood of successful procedures. We discussed the risks of the procedures including infection, bleeding, tissue injury and further surgery. We discussed the low dose of radioactivity involved in the procedures. Informed, written consent was given. The usual time-out protocol was performed immediately prior to the procedures. MAMMOGRAPHIC GUIDED RADIOACTIVE SEED LOCALIZATION OF THE LEFT BREAST: Using mammographic guidance, sterile technique, 1% lidocaine and an I-125 radioactive seed, the X clip was localized using a MEDIAL approach. The follow-up mammogram images confirm the seed in the expected location and were marked for Dr. Marlou Starks. Follow-up survey of the patient confirms presence of the radioactive seed. Order number of I-125 seed:  [id number]. Total activity:  8.127 millicuries.  Reference Date: 07/05/2019. MAMMOGRAPHIC GUIDED RADIOACTIVE SEED LOCALIZATION OF THE RIGHT BREAST: Using mammographic guidance, sterile technique, 1% lidocaine and an I-125 radioactive seed, the COIL clip was localized using a MEDIAL approach. The follow-up mammogram images confirm the seed in the expected location and were marked for Dr. Marlou Starks. Follow-up survey of the patient confirms presence of the radioactive seed, 0.5 cm posterior to the COIL clip. Order number of I-125 seed:  [id number]. Total activity:  4.496 millicuries.  Reference Date: 07/05/2019. The  patient tolerated the procedures well and was released from the [facility]. She was given instructions regarding seed removal. IMPRESSION: Radioactive seed localization of the LEFT breast. Radioactive seed localization of the RIGHT breast. No apparent complications. Electronically Signed   By: Margarette Canada M.D.   On: 07/30/2019 13:35   MM RT RADIOACTIVE SEED LOC MAMMO GUIDE  Result Date: 07/30/2019 CLINICAL DATA:  64 year old female for radioactive seed localization of LEFT breast cancer and radioactive seed localization of RIGHT breast sclerosing papillary lesion. EXAM: MAMMOGRAPHIC GUIDED RADIOACTIVE SEED LOCALIZATION OF THE LEFT BREAST MAMMOGRAPHIC GUIDED RADIOACTIVE SEED LOCALIZATION OF THE RIGHT BREAST COMPARISON:  Previous exam(s). FINDINGS: Patient presents for radioactive seed localizations prior to LEFT lumpectomy and RIGHT breast excision. I met with the patient and we discussed the procedures of seed  localization including benefits and alternatives. We discussed the high likelihood of successful procedures. We discussed the risks of the procedures including infection, bleeding, tissue injury and further surgery. We discussed the low dose of radioactivity involved in the procedures. Informed, written consent was given. The usual time-out protocol was performed immediately prior to the procedures. MAMMOGRAPHIC GUIDED RADIOACTIVE SEED LOCALIZATION OF THE LEFT BREAST: Using mammographic guidance, sterile technique, 1% lidocaine and an I-125 radioactive seed, the X clip was localized using a MEDIAL approach. The follow-up mammogram images confirm the seed in the expected location and were marked for Dr. Marlou Starks. Follow-up survey of the patient confirms presence of the radioactive seed. Order number of I-125 seed:  [id number]. Total activity:  8.756 millicuries.  Reference Date: 07/05/2019. MAMMOGRAPHIC GUIDED RADIOACTIVE SEED LOCALIZATION OF THE RIGHT BREAST: Using mammographic guidance, sterile technique,  1% lidocaine and an I-125 radioactive seed, the COIL clip was localized using a MEDIAL approach. The follow-up mammogram images confirm the seed in the expected location and were marked for Dr. Marlou Starks. Follow-up survey of the patient confirms presence of the radioactive seed, 0.5 cm posterior to the COIL clip. Order number of I-125 seed:  [id number]. Total activity:  4.166 millicuries.  Reference Date: 07/05/2019. The patient tolerated the procedures well and was released from the [facility]. She was given instructions regarding seed removal. IMPRESSION: Radioactive seed localization of the LEFT breast. Radioactive seed localization of the RIGHT breast. No apparent complications. Electronically Signed   By: Margarette Canada M.D.   On: 07/30/2019 13:35       IMPRESSION/PLAN: 1. 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 Dr. Lisbeth Renshaw discusses the pathology findings and reviews the nature of noninvasive left breast disease.  She has done well since surgery, and is ready to proceed with radiotherapy.  We again reviewed the rationale for this even though her surgery results were excellent.  She is aware that the rationale would be to reduce the risk of local recurrence.. We discussed the risks, benefits, short, and long term effects of radiotherapy, and the patient is interested in proceeding. Dr. Lisbeth Renshaw discusses the delivery and logistics of radiotherapy and anticipates a course of 4 weeks of radiotherapy.  She will simulate today. Written consent is obtained and placed in the chart, a copy was provided to the patient. 2. Back pain.  She will continue to work with her PCP providers for management of this.  In a visit lasting 45 minutes, greater than 50% of the time was spent face to face discussing the patient's condition, in preparation for the discussion, and coordinating the patient's care.   The above documentation reflects my direct findings during this shared patient visit. Please see the  separate note by Dr. Lisbeth Renshaw on this date for the remainder of the patient's plan of care.    Carola Rhine, PAC

## 2019-08-22 NOTE — Progress Notes (Signed)
[place] Psychosocial Distress Screening Clinical Social Work  Clinical Social Work was referred by distress screening protocol.  The patient scored a 9 on the Psychosocial Distress Thermometer which indicates severe distress. Clinical Social Worker contacted patient by phone to assess for distress and other psychosocial needs.   Patient reports that she has been feeling more sad lately because she lost her father (has also experienced deaths of other close family members in the last year). She is trying to cope by doing painting and puzzles to help relax. She was nervous today before Sim, but feels she did well in the machine.   Working with her insurance transportation benefit to get rides set up for radiation and other appointments.   Inquired if imaging that was done of her back was sent to her primary care doctor ([place]). CSW will send message to medical team to determine.  ONCBCN DISTRESS SCREENING 08/22/2019  Screening Type Initial Screening  Distress experienced in past week (1-10) 9  Practical problem type Housing  Emotional problem type Depression;Nervousness/Anxiety;Isolation/feeling alone  Physical Problem type Pain;Nausea/vomiting;Sleep/insomnia;Breathing;Tingling hands/feet;Skin dry/itchy  Physician notified of physical symptoms Yes  Referral to clinical psychology No  Referral to clinical social work Yes  Referral to dietition No  Referral to financial advocate No  Referral to support programs No  Referral to palliative care No   Clinical Social Worker follow up needed: No. Patient reports she feels she is doing fine. CSW encouraged patient to call whenever she would like extra support or if she runs into transportation issues.  If yes, follow up plan:  Birdia Jaycox E, LCSW

## 2019-08-26 DIAGNOSIS — Z510 Encounter for antineoplastic radiation therapy: Secondary | ICD-10-CM | POA: Diagnosis not present

## 2019-09-02 ENCOUNTER — Ambulatory Visit
Admission: RE | Admit: 2019-09-02 | Discharge: 2019-09-02 | Disposition: A | Discharge status: Home / Self Care | Payer: Medicaid Other | Source: Ambulatory Visit | Attending: Radiation Oncology | Admitting: Radiation Oncology

## 2019-09-02 DIAGNOSIS — Z510 Encounter for antineoplastic radiation therapy: Secondary | ICD-10-CM | POA: Diagnosis not present

## 2019-09-03 ENCOUNTER — Ambulatory Visit
Admission: RE | Admit: 2019-09-03 | Discharge: 2019-09-03 | Disposition: A | Discharge status: Home / Self Care | Payer: Medicaid Other | Source: Ambulatory Visit | Attending: Radiation Oncology | Admitting: Radiation Oncology

## 2019-09-03 ENCOUNTER — Other Ambulatory Visit: Payer: Self-pay

## 2019-09-03 DIAGNOSIS — Z510 Encounter for antineoplastic radiation therapy: Secondary | ICD-10-CM | POA: Diagnosis not present

## 2019-09-04 ENCOUNTER — Ambulatory Visit
Admission: RE | Admit: 2019-09-04 | Discharge: 2019-09-04 | Disposition: A | Discharge status: Home / Self Care | Payer: Medicaid Other | Source: Ambulatory Visit | Attending: Radiation Oncology | Admitting: Radiation Oncology

## 2019-09-04 ENCOUNTER — Other Ambulatory Visit: Payer: Self-pay

## 2019-09-04 DIAGNOSIS — Z510 Encounter for antineoplastic radiation therapy: Secondary | ICD-10-CM | POA: Diagnosis not present

## 2019-09-05 ENCOUNTER — Other Ambulatory Visit: Payer: Self-pay

## 2019-09-05 ENCOUNTER — Ambulatory Visit
Admission: RE | Admit: 2019-09-05 | Discharge: 2019-09-05 | Disposition: A | Discharge status: Home / Self Care | Payer: Medicaid Other | Source: Ambulatory Visit | Attending: Radiation Oncology | Admitting: Radiation Oncology

## 2019-09-05 DIAGNOSIS — Z510 Encounter for antineoplastic radiation therapy: Secondary | ICD-10-CM | POA: Diagnosis not present

## 2019-09-06 ENCOUNTER — Ambulatory Visit
Admission: RE | Admit: 2019-09-06 | Discharge: 2019-09-06 | Disposition: A | Discharge status: Home / Self Care | Payer: Medicaid Other | Source: Ambulatory Visit | Attending: Radiation Oncology | Admitting: Radiation Oncology

## 2019-09-06 ENCOUNTER — Other Ambulatory Visit: Payer: Self-pay

## 2019-09-06 DIAGNOSIS — C50312 Malignant neoplasm of lower-inner quadrant of left female breast: Secondary | ICD-10-CM

## 2019-09-06 DIAGNOSIS — Z510 Encounter for antineoplastic radiation therapy: Secondary | ICD-10-CM | POA: Diagnosis not present

## 2019-09-06 MED ORDER — ALRA NON-METALLIC DEODORANT (RAD-ONC)
1.0000 "application " | Freq: Once | TOPICAL | Status: AC
  Administered 2019-09-06: 1 via TOPICAL

## 2019-09-06 MED ORDER — SONAFINE EX EMUL
1.0000 "application " | Freq: Once | CUTANEOUS | Status: AC
  Administered 2019-09-06: 1 via TOPICAL

## 2019-09-06 NOTE — Progress Notes (Signed)
Pt here for patient teaching.  Pt given Radiation and You booklet, skin care instructions, Alra deodorant and Sonafine.  Reviewed areas of pertinence such as fatigue, hair loss, skin changes, breast tenderness and breast swelling . Pt able to give teach back of to pat skin and use unscented/gentle soap,apply Sonafine bid, avoid applying anything to skin within 4 hours of treatment, avoid wearing an under wire bra and to use an electric razor if they must shave. Pt verbalizes understanding of information given and will contact nursing with any questions or concerns.     Latrelle Bazar M. Jerard Bays RN, BSN             

## 2019-09-09 ENCOUNTER — Ambulatory Visit
Admission: RE | Admit: 2019-09-09 | Discharge: 2019-09-09 | Disposition: A | Discharge status: Home / Self Care | Payer: Medicaid Other | Source: Ambulatory Visit | Attending: Radiation Oncology | Admitting: Radiation Oncology

## 2019-09-09 DIAGNOSIS — Z510 Encounter for antineoplastic radiation therapy: Secondary | ICD-10-CM | POA: Diagnosis not present

## 2019-09-10 ENCOUNTER — Inpatient Hospital Stay: Payer: Medicaid Other | Attending: Oncology | Admitting: Oncology

## 2019-09-10 ENCOUNTER — Other Ambulatory Visit: Payer: Self-pay

## 2019-09-10 ENCOUNTER — Ambulatory Visit
Admission: RE | Admit: 2019-09-10 | Discharge: 2019-09-10 | Disposition: A | Discharge status: Home / Self Care | Payer: Medicaid Other | Source: Ambulatory Visit | Attending: Radiation Oncology | Admitting: Radiation Oncology

## 2019-09-10 VITALS — BP 117/65 | HR 83 | Temp 97.3°F | Resp 18 | Ht 66.5 in | Wt 275.7 lb

## 2019-09-10 DIAGNOSIS — Z923 Personal history of irradiation: Secondary | ICD-10-CM | POA: Diagnosis not present

## 2019-09-10 DIAGNOSIS — Z170 Estrogen receptor positive status [ER+]: Secondary | ICD-10-CM | POA: Insufficient documentation

## 2019-09-10 DIAGNOSIS — Z79811 Long term (current) use of aromatase inhibitors: Secondary | ICD-10-CM | POA: Insufficient documentation

## 2019-09-10 DIAGNOSIS — Z7984 Long term (current) use of oral hypoglycemic drugs: Secondary | ICD-10-CM | POA: Diagnosis not present

## 2019-09-10 DIAGNOSIS — E1169 Type 2 diabetes mellitus with other specified complication: Secondary | ICD-10-CM | POA: Diagnosis not present

## 2019-09-10 DIAGNOSIS — Z6841 Body Mass Index (BMI) 40.0 and over, adult: Secondary | ICD-10-CM | POA: Insufficient documentation

## 2019-09-10 DIAGNOSIS — I10 Essential (primary) hypertension: Secondary | ICD-10-CM | POA: Insufficient documentation

## 2019-09-10 DIAGNOSIS — Z79899 Other long term (current) drug therapy: Secondary | ICD-10-CM | POA: Diagnosis not present

## 2019-09-10 DIAGNOSIS — Z510 Encounter for antineoplastic radiation therapy: Secondary | ICD-10-CM | POA: Diagnosis not present

## 2019-09-10 DIAGNOSIS — D0512 Intraductal carcinoma in situ of left breast: Secondary | ICD-10-CM | POA: Insufficient documentation

## 2019-09-10 DIAGNOSIS — E119 Type 2 diabetes mellitus without complications: Secondary | ICD-10-CM | POA: Insufficient documentation

## 2019-09-10 DIAGNOSIS — C50312 Malignant neoplasm of lower-inner quadrant of left female breast: Secondary | ICD-10-CM

## 2019-09-10 DIAGNOSIS — J449 Chronic obstructive pulmonary disease, unspecified: Secondary | ICD-10-CM | POA: Diagnosis not present

## 2019-09-10 DIAGNOSIS — Z7951 Long term (current) use of inhaled steroids: Secondary | ICD-10-CM | POA: Insufficient documentation

## 2019-09-10 DIAGNOSIS — E669 Obesity, unspecified: Secondary | ICD-10-CM

## 2019-09-10 DIAGNOSIS — F17210 Nicotine dependence, cigarettes, uncomplicated: Secondary | ICD-10-CM | POA: Insufficient documentation

## 2019-09-10 DIAGNOSIS — E7800 Pure hypercholesterolemia, unspecified: Secondary | ICD-10-CM | POA: Insufficient documentation

## 2019-09-10 MED ORDER — ANASTROZOLE 1 MG PO TABS
1.0000 mg | ORAL_TABLET | Freq: Every day | ORAL | 4 refills | Status: DC
Start: 2019-09-10 — End: 2023-02-06

## 2019-09-10 NOTE — Progress Notes (Signed)
Caitlin Rivera  [phone] [phone]     ID: Caitlin Rivera DOB: 1955/10/19  MR#: [id number]  ENI#:[id number]  Patient Care Team: Benito Mccreedy, MD as PCP - General (Internal Medicine) Alonnie Bieker, Virgie Dad, MD as Consulting Physician (Oncology) Erroll Luna, MD as Consulting Physician (General Surgery) Mauro Kaufmann, RN as Oncology Nurse Navigator Rockwell Germany, RN as Oncology Nurse Navigator Chauncey Cruel, MD OTHER MD:  CHIEF COMPLAINT: Estrogen receptor positive noninvasive breast cancer  CURRENT TREATMENT: Adjuvant radiation   INTERVAL HISTORY: Caitlin Rivera returns today for follow up of her noninvasive breast cancer. She was evaluated in the breast cancer clinic on 07/02/2019.   Since her last visit, she underwent bilateral lumpectomies on 07/31/2019 under Dr. Brantley Stage. Pathology from the procedure [id number]) showed: 1. Right Breast  - intraductal papilloma 2. Left Breast  - no residual carcinoma  She met with Dr. Lisbeth Renshaw on 08/22/2019 to discuss radiation therapy. She began treatment on 09/02/2019 and is scheduled to finish on 09/30/2019   REVIEW OF SYSTEMS: Caitlin Rivera has a variety of complaints which however are not related to her breast cancer.  She has occasional headaches, occasionally she has a little bit of a cough, she has problems with her low back and her knees.  She continues to have problems with obesity and diabetes.  She is not able to exercise regularly at present.  Aside from these issues a detailed review of systems was noncontributory   HISTORY OF CURRENT ILLNESS: From the original intake note:  "Caitlin Rivera" had routine screening mammography on 04/23/2019 showing a possible abnormality in the bilateral breasts. She underwent bilateral diagnostic mammography with tomography and bilateral breast ultrasonography at [facility] on 05/01/2019 showing: breast density category B; persistent lower-inner left breast asymmetry with  no ultrasound correlate; superficial 0.7 cm right breast mass at 3 o'clock.  Accordingly on 06/03/2019 she proceeded to biopsy of the bilateral breast areas in question. The pathology from this procedure ([id number]) showed:  1. Left breast  - benign breast parenchyma 2. Right breast  - sclerosing papillary lesion   The left breast diagnosis was found to be discordant, and a repeat biopsy was recommended. This was performed on 06/14/2019, with pathology [id number]) showing: ductal carcinoma in situ with calcifications, intermediate grade, involving an underlying complex sclerosing lesion. Prognostic indicators significant for: estrogen receptor, 95% positive and progesterone receptor, 85% positive, both with strong staining intensity.  The patient's subsequent history is as detailed below.   PAST MEDICAL HISTORY: Past Medical History:  Diagnosis Date  . Asthma   . Cancer ([place]) 06/2019   left breast DCIS  . COPD (chronic obstructive pulmonary disease) ([place])    smoker  . Diabetes mellitus without complication ([place])   . Fibroids   . Hypertension   . Smoker   Hypertension hypercholesterolemia type 2 diabetes mellitus morbid obesity and possibly hepatitis.   PAST SURGICAL HISTORY: Past Surgical History:  Procedure Laterality Date  . BREAST LUMPECTOMY WITH RADIOACTIVE SEED LOCALIZATION Bilateral 07/31/2019   Procedure: BILATERAL BREAST LUMPECTOMY WITH RADIOACTIVE SEED LOCALIZATION;  Surgeon: Jovita Kussmaul, MD;  Location: [facility];  Service: General;  Laterality: Bilateral;  . TUBAL LIGATION    . TUBAL LIGATION    Possible appendectomy   FAMILY HISTORY: Family History  Problem Relation Age of Onset  . Cancer Mother    The patient's father is 77 years old as of June 2021.  Of 11 siblings 1 sister had cancer of unknown type.  The patient's mother died from "stomach "cancer at the age of 57.  One maternal uncle had throat cancer.  The patient herself has a total of 8  siblings, none with cancer   GYNECOLOGIC HISTORY:  No LMP recorded. Patient is postmenopausal. Menarche: 64 years old Age at first live birth: 64 years old [place] P  3 LMP 79 Contraceptive yes, several years, without complications HRT no Hysterectomy?  No BSO?  No no   SOCIAL HISTORY: (updated 06/2019)  Caitlin Rivera used to work in [employer], but is now disabled.  She moved to this area from [place] to be closer to her oldest daughter Varney Biles.  Patient is a homemaker.  Daughter Donald Prose works in a Materials engineer in [place].  Daughter Donella Stade also lives in [place] but is disabled.  The patient has 9 grandchildren.  She has no great-grandchildren.  She attends a [employer].   ADVANCED DIRECTIVES:   HEALTH MAINTENANCE: Social History   Tobacco Use  . Smoking status: Current Every Day Smoker    Packs/day: 0.50    Types: Cigarettes  . Smokeless tobacco: Never Used  Substance Use Topics  . Alcohol use: No  . Drug use: Yes    Types: "Crack" cocaine    Comment: crack-last smoked 2 wks ago-07-24-19, states she uses once a month     Colonoscopy: Never  PAP: Up-to-date  Bone density: never   Allergies  Allergen Reactions  . Morphine And Related Itching  . Ibuprofen Itching    Current Outpatient Medications  Medication Sig Dispense Refill  . albuterol (VENTOLIN HFA) 108 (90 Base) MCG/ACT inhaler     . amLODipine (NORVASC) 5 MG tablet Take 5 mg by mouth daily.    Marland Kitchen amoxicillin (AMOXIL) 500 MG tablet Take 500 mg by mouth 3 (three) times daily.    Marland Kitchen gabapentin (NEURONTIN) 400 MG capsule Take 400 mg by mouth 3 (three) times daily.    Marland Kitchen HYDROcodone-acetaminophen (NORCO/VICODIN) 5-325 MG tablet Take 1-2 tablets by mouth every 6 (six) hours as needed for moderate pain or severe pain. (Patient not taking: Reported on 08/22/2019) 15 tablet 0  . metFORMIN (GLUCOPHAGE) 500 MG tablet Take 500 mg by mouth 2 (two) times daily.    Marland Kitchen OVER THE COUNTER MEDICATION Take 1-2 tablets by mouth 2 (two) times daily  as needed (for pain). OTC Pain Reliever PM    . rosuvastatin (CRESTOR) 20 MG tablet Take 20 mg by mouth daily.    . SYMBICORT 160-4.5 MCG/ACT inhaler     . tiZANidine (ZANAFLEX) 4 MG tablet Take 4 mg by mouth every 8 (eight) hours as needed.     No current facility-administered medications for this visit.    OBJECTIVE: African-American woman in no acute distress  Vitals:   09/10/19 1446  BP: 117/65  Pulse: 83  Resp: 18  Temp: (!) 97.3 F (36.3 C)  SpO2: 94%     Body mass index is 43.83 kg/m.   Wt Readings from Last 3 Encounters:  09/10/19 275 lb 11.2 oz (125.1 kg)  08/22/19 276 lb (125.2 kg)  07/31/19 275 lb 9.2 oz (125 kg)      ECOG FS:2 - Symptomatic, <50% confined to bed  Sclerae unicteric, EOMs intact Wearing a mask No cervical or supraclavicular adenopathy Lungs no rales or rhonchi Heart regular rate and rhythm Abd soft, obese, nontender, positive bowel sounds MSK no focal spinal tenderness, no upper extremity lymphedema Neuro: nonfocal, well oriented, appropriate affect Breasts: She is status post bilateral lumpectomies and  is currently receiving radiation to the left breast.  There is no significant hyperpigmentation yet.  Both axillae are benign.   LAB RESULTS:  CMP     Component Value Date/Time   NA 140 07/29/2019 0903   NA 141 01/03/2012 1730   K 5.2 (H) 07/29/2019 0903   K 3.0 (L) 01/03/2012 1730   CL 103 07/29/2019 0903   CL 104 01/03/2012 1730   CO2 26 07/29/2019 0903   CO2 31 01/03/2012 1730   GLUCOSE 135 (H) 07/29/2019 0903   GLUCOSE 92 01/03/2012 1730   BUN 12 07/29/2019 0903   BUN 12 01/03/2012 1730   CREATININE 0.81 07/29/2019 0903   CREATININE 0.86 07/02/2019 1457   CREATININE 0.94 01/03/2012 1730   CALCIUM 9.3 07/29/2019 0903   CALCIUM 8.4 (L) 01/03/2012 1730   PROT 7.5 07/02/2019 1457   PROT 7.7 01/03/2012 1730   ALBUMIN 3.9 07/02/2019 1457   ALBUMIN 3.4 01/03/2012 1730   AST 13 (L) 07/02/2019 1457   ALT 17 07/02/2019 1457   ALT  32 01/03/2012 1730   ALKPHOS 92 07/02/2019 1457   ALKPHOS 113 01/03/2012 1730   BILITOT 0.5 07/02/2019 1457   GFRNONAA >60 07/29/2019 0903   GFRNONAA >60 07/02/2019 1457   GFRNONAA >60 01/03/2012 1730   GFRAA >60 07/29/2019 0903   GFRAA >60 07/02/2019 1457   GFRAA >60 01/03/2012 1730    No results found for: TOTALPROTELP, ALBUMINELP, A1GS, A2GS, BETS, BETA2SER, GAMS, MSPIKE, SPEI  Lab Results  Component Value Date   WBC 8.7 07/02/2019   NEUTROABS 4.5 07/02/2019   HGB 15.7 (H) 07/02/2019   HCT 50.0 (H) 07/02/2019   MCV 94.5 07/02/2019   PLT 253 07/02/2019    No results found for: LABCA2  No components found for: NATFTD322  No results for input(s): INR in the last 168 hours.  No results found for: LABCA2  No results found for: GUR427  No results found for: CWC376  No results found for: EGB151  No results found for: CA2729  No components found for: HGQUANT  No results found for: CEA1 / No results found for: CEA1   No results found for: AFPTUMOR  No results found for: CHROMOGRNA  No results found for: KPAFRELGTCHN, LAMBDASER, KAPLAMBRATIO (kappa/lambda light chains)  No results found for: HGBA, HGBA2QUANT, HGBFQUANT, HGBSQUAN (Hemoglobinopathy evaluation)   No results found for: LDH  No results found for: IRON, TIBC, IRONPCTSAT (Iron and TIBC)  No results found for: FERRITIN  Urinalysis    Component Value Date/Time   COLORURINE YELLOW 08/21/2013 1847   APPEARANCEUR CLOUDY (A) 08/21/2013 1847   LABSPEC 1.027 08/21/2013 1847   PHURINE 5.5 08/21/2013 1847   GLUCOSEU NEGATIVE 08/21/2013 1847   HGBUR NEGATIVE 08/21/2013 1847   BILIRUBINUR NEGATIVE 08/21/2013 1847   KETONESUR 15 (A) 08/21/2013 1847   PROTEINUR NEGATIVE 08/21/2013 1847   UROBILINOGEN 1.0 08/21/2013 1847   NITRITE NEGATIVE 08/21/2013 1847   LEUKOCYTESUR SMALL (A) 08/21/2013 1847     STUDIES: No results found.   ELIGIBLE FOR AVAILABLE RESEARCH PROTOCOL: AET  ASSESSMENT: 64 y.o.  [place] woman status post bilateral breast biopsies 06/03/2019 showing  (a) in the left breast, benign tissue, felt to be discordant    (b) in the right breast, a complex sclerosing lesion  (c) repeat left breast biopsy 06/14/2019 shows ductal carcinoma in situ, grade 2, estrogen and progesterone receptor positive  (1) status post bilateral lumpectomies 07/31/2019 showing  (a) in the right breast, an intraductal papilloma  (b) in  the left breast, no residual carcinoma.  (2) adjuvant radiation in process  (3) consider antiestrogens   PLAN: Caitlin Rivera is tolerating radiation well so far.  She has not yet started using the creams but I suggested she go ahead and get going with those to minimize skin damage.  She understands her breast cancer was not invasive and therefore not life-threatening.  After radiation her risk of local recurrence will be low.  Nevertheless she will be at risk of developing another breast cancer in the future, in either breast.  That risk approaches 1 %/year patient can cut that risk in half by taking anastrozole.  Today we discussed the possible toxicities side effects and complications of this agent.  She will want to start it after radiation is completed.  We will have a virtual visit with her sometime in December to make sure she is tolerating it well.  She will then resume annual mammography April 2022 and I will see her in person in May 2022  Total encounter time 25 minutes.Sarajane Jews C. Merritt Mccravy, MD 09/10/2019 5:20 PM Medical Oncology and Hematology [facility] [place], [place] [address] Tel. [phone]    Fax. [phone]   This document serves as a record of services personally performed by Lurline Del, MD. It was created on his behalf by Wilburn Mylar, a trained medical scribe. The creation of this record is based on the scribe's personal observations and the provider's statements to them.   I, Lurline Del  MD, have reviewed the above documentation for accuracy and completeness, and I agree with the above.   *Total Encounter Time as defined by the Centers for Medicare and Medicaid Services includes, in addition to the face-to-face time of a patient visit (documented in the note above) non-face-to-face time: obtaining and reviewing outside history, ordering and reviewing medications, tests or procedures, care coordination (communications with other health care professionals or caregivers) and documentation in the medical record.

## 2019-09-11 ENCOUNTER — Other Ambulatory Visit: Payer: Self-pay

## 2019-09-11 ENCOUNTER — Ambulatory Visit
Admission: RE | Admit: 2019-09-11 | Discharge: 2019-09-11 | Disposition: A | Discharge status: Home / Self Care | Payer: Medicaid Other | Source: Ambulatory Visit | Attending: Radiation Oncology | Admitting: Radiation Oncology

## 2019-09-11 DIAGNOSIS — C50312 Malignant neoplasm of lower-inner quadrant of left female breast: Secondary | ICD-10-CM | POA: Insufficient documentation

## 2019-09-11 DIAGNOSIS — Z170 Estrogen receptor positive status [ER+]: Secondary | ICD-10-CM | POA: Diagnosis not present

## 2019-09-11 DIAGNOSIS — Z510 Encounter for antineoplastic radiation therapy: Secondary | ICD-10-CM | POA: Insufficient documentation

## 2019-09-12 ENCOUNTER — Ambulatory Visit
Admission: RE | Admit: 2019-09-12 | Discharge: 2019-09-12 | Disposition: A | Discharge status: Home / Self Care | Payer: Medicaid Other | Source: Ambulatory Visit | Attending: Radiation Oncology | Admitting: Radiation Oncology

## 2019-09-12 DIAGNOSIS — Z510 Encounter for antineoplastic radiation therapy: Secondary | ICD-10-CM | POA: Diagnosis not present

## 2019-09-13 ENCOUNTER — Other Ambulatory Visit: Payer: Self-pay

## 2019-09-13 ENCOUNTER — Ambulatory Visit
Admission: RE | Admit: 2019-09-13 | Discharge: 2019-09-13 | Disposition: A | Discharge status: Home / Self Care | Payer: Medicaid Other | Source: Ambulatory Visit | Attending: Radiation Oncology | Admitting: Radiation Oncology

## 2019-09-13 DIAGNOSIS — Z510 Encounter for antineoplastic radiation therapy: Secondary | ICD-10-CM | POA: Diagnosis not present

## 2019-09-17 ENCOUNTER — Ambulatory Visit
Admission: RE | Admit: 2019-09-17 | Discharge: 2019-09-17 | Disposition: A | Discharge status: Home / Self Care | Payer: Medicaid Other | Source: Ambulatory Visit | Attending: Radiation Oncology | Admitting: Radiation Oncology

## 2019-09-17 ENCOUNTER — Encounter: Payer: Self-pay | Admitting: Radiation Oncology

## 2019-09-17 DIAGNOSIS — Z510 Encounter for antineoplastic radiation therapy: Secondary | ICD-10-CM | POA: Diagnosis not present

## 2019-09-18 ENCOUNTER — Ambulatory Visit
Admission: RE | Admit: 2019-09-18 | Discharge: 2019-09-18 | Disposition: A | Discharge status: Home / Self Care | Payer: Medicaid Other | Source: Ambulatory Visit | Attending: Radiation Oncology | Admitting: Radiation Oncology

## 2019-09-18 ENCOUNTER — Other Ambulatory Visit: Payer: Self-pay

## 2019-09-18 DIAGNOSIS — Z510 Encounter for antineoplastic radiation therapy: Secondary | ICD-10-CM | POA: Diagnosis not present

## 2019-09-19 ENCOUNTER — Ambulatory Visit
Admission: RE | Admit: 2019-09-19 | Discharge: 2019-09-19 | Disposition: A | Discharge status: Home / Self Care | Payer: Medicaid Other | Source: Ambulatory Visit | Attending: Radiation Oncology | Admitting: Radiation Oncology

## 2019-09-19 DIAGNOSIS — Z510 Encounter for antineoplastic radiation therapy: Secondary | ICD-10-CM | POA: Diagnosis not present

## 2019-09-19 NOTE — Progress Notes (Signed)
  Radiation Oncology         [phone]) [phone] ________________________________  Name: Caitlin Rivera MRN: [id number]  Date: 09/17/2019  DOB: 1955-11-10  SIMULATION NOTE   NARRATIVE:  The patient underwent simulation today for ongoing radiation therapy.  The existing CT study set was employed for the purpose of virtual treatment planning.  The target and avoidance structures were reviewed and modified as necessary.  Treatment planning then occurred.  The radiation boost prescription was entered and confirmed.  A total of 3 complex treatment devices were fabricated in the form of multi-leaf collimators to shape radiation around the targets while maximally excluding nearby normal structures. I have requested : Isodose Plan.    PLAN:  This modified radiation beam arrangement is intended to continue the current radiation dose to an additional 8 Gy in 4 fractions for a total cumulative dose of 50.56 Gy.    ------------------------------------------------  Jodelle Gross, MD, PhD

## 2019-09-20 ENCOUNTER — Ambulatory Visit
Admission: RE | Admit: 2019-09-20 | Discharge: 2019-09-20 | Disposition: A | Discharge status: Home / Self Care | Payer: Medicaid Other | Source: Ambulatory Visit | Attending: Radiation Oncology | Admitting: Radiation Oncology

## 2019-09-20 ENCOUNTER — Ambulatory Visit: Payer: Medicaid Other | Admitting: Radiation Oncology

## 2019-09-20 DIAGNOSIS — Z510 Encounter for antineoplastic radiation therapy: Secondary | ICD-10-CM | POA: Diagnosis not present

## 2019-09-23 ENCOUNTER — Other Ambulatory Visit: Payer: Self-pay

## 2019-09-23 ENCOUNTER — Ambulatory Visit
Admission: RE | Admit: 2019-09-23 | Discharge: 2019-09-23 | Disposition: A | Discharge status: Home / Self Care | Payer: Medicaid Other | Source: Ambulatory Visit | Attending: Radiation Oncology | Admitting: Radiation Oncology

## 2019-09-23 DIAGNOSIS — Z510 Encounter for antineoplastic radiation therapy: Secondary | ICD-10-CM | POA: Diagnosis not present

## 2019-09-24 ENCOUNTER — Other Ambulatory Visit: Payer: Self-pay

## 2019-09-24 ENCOUNTER — Ambulatory Visit
Admission: RE | Admit: 2019-09-24 | Discharge: 2019-09-24 | Disposition: A | Discharge status: Home / Self Care | Payer: Medicaid Other | Source: Ambulatory Visit | Attending: Radiation Oncology | Admitting: Radiation Oncology

## 2019-09-24 DIAGNOSIS — Z510 Encounter for antineoplastic radiation therapy: Secondary | ICD-10-CM | POA: Diagnosis not present

## 2019-09-25 ENCOUNTER — Ambulatory Visit
Admission: RE | Admit: 2019-09-25 | Discharge: 2019-09-25 | Disposition: A | Discharge status: Home / Self Care | Payer: Medicaid Other | Source: Ambulatory Visit | Attending: Radiation Oncology | Admitting: Radiation Oncology

## 2019-09-25 DIAGNOSIS — Z510 Encounter for antineoplastic radiation therapy: Secondary | ICD-10-CM | POA: Diagnosis not present

## 2019-09-26 ENCOUNTER — Ambulatory Visit
Admission: RE | Admit: 2019-09-26 | Discharge: 2019-09-26 | Disposition: A | Discharge status: Home / Self Care | Payer: Medicaid Other | Source: Ambulatory Visit | Attending: Radiation Oncology | Admitting: Radiation Oncology

## 2019-09-26 ENCOUNTER — Other Ambulatory Visit: Payer: Self-pay

## 2019-09-26 DIAGNOSIS — Z510 Encounter for antineoplastic radiation therapy: Secondary | ICD-10-CM | POA: Diagnosis not present

## 2019-09-27 ENCOUNTER — Ambulatory Visit
Admission: RE | Admit: 2019-09-27 | Discharge: 2019-09-27 | Disposition: A | Discharge status: Home / Self Care | Payer: Medicaid Other | Source: Ambulatory Visit | Attending: Radiation Oncology | Admitting: Radiation Oncology

## 2019-09-27 ENCOUNTER — Other Ambulatory Visit: Payer: Self-pay

## 2019-09-27 DIAGNOSIS — Z510 Encounter for antineoplastic radiation therapy: Secondary | ICD-10-CM | POA: Diagnosis not present

## 2019-09-30 ENCOUNTER — Encounter: Payer: Self-pay | Admitting: Radiation Oncology

## 2019-09-30 ENCOUNTER — Other Ambulatory Visit: Payer: Self-pay

## 2019-09-30 ENCOUNTER — Encounter: Payer: Self-pay | Admitting: *Deleted

## 2019-09-30 ENCOUNTER — Ambulatory Visit
Admission: RE | Admit: 2019-09-30 | Discharge: 2019-09-30 | Disposition: A | Discharge status: Home / Self Care | Payer: Medicaid Other | Source: Ambulatory Visit | Attending: Radiation Oncology | Admitting: Radiation Oncology

## 2019-09-30 DIAGNOSIS — Z510 Encounter for antineoplastic radiation therapy: Secondary | ICD-10-CM | POA: Diagnosis not present

## 2019-10-17 NOTE — Progress Notes (Signed)
  Radiation Oncology         [phone]) [phone] ________________________________  Name: Caitlin Rivera MRN: [id number]  Date: 09/30/2019  DOB: [birth year]  End of Treatment Note  Diagnosis:   left-sided breast cancer     Indication for treatment:  Curative       Radiation treatment dates:   09/02/19 - 09/30/19  Site/dose:   The patient initially received a dose of 42.56 Gy in 16 fractions to the breast using whole-breast tangent fields. This was delivered using a 3-D conformal technique. The patient then received a boost to the seroma. This delivered an additional 8 Gy in 46fractions using a 3 field photon technique due to the depth of the seroma. The total dose was 50.56 Gy.  Narrative: The patient tolerated radiation treatment relatively well.   The patient had some expected skin irritation as she progressed during treatment.    Plan: The patient has completed radiation treatment. The patient will return to radiation oncology clinic for routine followup in one month. I advised the patient to call or return sooner if they have any questions or concerns related to their recovery or treatment. ________________________________  Jodelle Gross, M.D., Ph.D.

## 2019-10-28 ENCOUNTER — Telehealth: Payer: Self-pay | Admitting: Radiation Oncology

## 2019-10-28 NOTE — Telephone Encounter (Signed)
  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  Interval Since Last Radiation: 4 weeks   09/02/19 - 09/30/19: The patient initially received a dose of 42.56 Gy in 16 fractions to the breast using whole-breast tangent fields. This was delivered using a 3-D conformal technique. The patient then received a boost to the seroma. This delivered an additional 8 Gy in 81fractions using a 3 field photon technique due to the depth of the seroma. The total dose was 50.56 Gy.  Narrative:  The patient was contacted today for routine follow-up. During treatment she did very well with radiotherapy and did not have significant desquamation. She reports she is doing pretty well and reports her skin is looking well without open areas. She had a dental procedure recently and has had a hard time talking because of this. No other complaints are verbalized.  Impression/Plan: 1. 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 The patient has been doing well since completion of radiotherapy. We discussed that we would be happy to continue to follow her as needed, but she will also continue to follow up with Dr. Jana Hakim in medical oncology. She was counseled on skin care as well as measures to avoid sun exposure to this area.  2. Survivorship. We discussed the importance of survivorship evaluation and encouraged her to attend her upcoming visit with that clinic. 3. Oral surgery. The patient has had recent oral surgery and will follow up with her dental practitioners throughout the healing process.     Carola Rhine, PAC

## 2019-12-11 ENCOUNTER — Inpatient Hospital Stay: Payer: Medicaid Other | Attending: Adult Health | Admitting: Adult Health

## 2019-12-11 DIAGNOSIS — Z170 Estrogen receptor positive status [ER+]: Secondary | ICD-10-CM

## 2019-12-11 DIAGNOSIS — C50312 Malignant neoplasm of lower-inner quadrant of left female breast: Secondary | ICD-10-CM | POA: Diagnosis not present

## 2019-12-11 NOTE — Progress Notes (Signed)
[place]  [phone] Fax:(336) [phone]     ID: Caitlin Rivera DOB: September 24, 1955  MR#: [id number]  NAT#:[id number]  Patient Care Team: Benito Mccreedy, MD as PCP - General (Internal Medicine) Magrinat, Virgie Dad, MD as Consulting Physician (Oncology) Erroll Luna, MD as Consulting Physician (General Surgery) Mauro Kaufmann, RN as Oncology Nurse Navigator Rockwell Germany, RN as Oncology Nurse Navigator Kyung Rudd, MD as Consulting Physician (Radiation Oncology) Scot Dock, NP OTHER MD:  CHIEF COMPLAINT: Estrogen receptor positive noninvasive breast cancer  CURRENT TREATMENT: Anastrozole daily   INTERVAL HISTORY: Caitlin Rivera returns today for follow up of her noninvasive breast cancer.   She is doing well today.  She completed adjuvant radiation on 09/30/2019.  She is taking Anastrozole daily and started this mid October, 2021.  REVIEW OF SYSTEMS: Caitlin Rivera is doing quite well today.  She is taking Gabapentin for chronic back pain.  She is walking some, but this is limited due to her pain.  She is hopeful that going up on her Gabapentin is helpful.  She denies any signs of recurrence.  A detailed ROS was otherwise non contributory.     HISTORY OF CURRENT ILLNESS: From the original intake note:  "Caitlin Rivera" had routine screening mammography on 04/23/2019 showing a possible abnormality in the bilateral breasts. She underwent bilateral diagnostic mammography with tomography and bilateral breast ultrasonography at [facility] on 05/01/2019 showing: breast density category B; persistent lower-inner left breast asymmetry with no ultrasound correlate; superficial 0.7 cm right breast mass at 3 o'clock.  Accordingly on 06/03/2019 she proceeded to biopsy of the bilateral breast areas in question. The pathology from this procedure ([id number]) showed:  1. Left breast  - benign breast parenchyma 2. Right breast  - sclerosing papillary lesion   The left  breast diagnosis was found to be discordant, and a repeat biopsy was recommended. This was performed on 06/14/2019, with pathology [id number]) showing: ductal carcinoma in situ with calcifications, intermediate grade, involving an underlying complex sclerosing lesion. Prognostic indicators significant for: estrogen receptor, 95% positive and progesterone receptor, 85% positive, both with strong staining intensity.  The patient's subsequent history is as detailed below.   PAST MEDICAL HISTORY: Past Medical History:  Diagnosis Date  . Asthma   . Cancer ([place]) 06/2019   left breast DCIS  . COPD (chronic obstructive pulmonary disease) ([place])    smoker  . Diabetes mellitus without complication ([place])   . Fibroids   . Hypertension   . Smoker   Hypertension hypercholesterolemia type 2 diabetes mellitus morbid obesity and possibly hepatitis.   PAST SURGICAL HISTORY: Past Surgical History:  Procedure Laterality Date  . BREAST LUMPECTOMY WITH RADIOACTIVE SEED LOCALIZATION Bilateral 07/31/2019   Procedure: BILATERAL BREAST LUMPECTOMY WITH RADIOACTIVE SEED LOCALIZATION;  Surgeon: Jovita Kussmaul, MD;  Location: [facility];  Service: General;  Laterality: Bilateral;  . TUBAL LIGATION    . TUBAL LIGATION    Possible appendectomy   FAMILY HISTORY: Family History  Problem Relation Age of Onset  . Cancer Mother    The patient's father is 64 years old as of June 2021.  Of 11 siblings 1 sister had cancer of unknown type.  The patient's mother died from "stomach "cancer at the age of 73.  One maternal uncle had throat cancer.  The patient herself has a total of 8 siblings, none with cancer   GYNECOLOGIC HISTORY:  No LMP recorded. Patient is postmenopausal. Menarche: 64 years old Age at first live  birth: 64 years old [name]  3 LMP 52 Contraceptive yes, several years, without complications HRT no Hysterectomy?  No BSO?  No no   SOCIAL HISTORY: (updated 06/2019)  Caitlin Rivera used to work in  [employer], but is now disabled.  She moved to this area from [place] to be closer to her oldest daughter Caitlin Rivera.  Patient is a homemaker.  Daughter Caitlin Rivera works in a Materials engineer in [place].  Daughter Caitlin Rivera also lives in [place] but is disabled.  The patient has 9 grandchildren.  She has no great-grandchildren.  She attends a [employer].   ADVANCED DIRECTIVES:   HEALTH MAINTENANCE: Social History   Tobacco Use  . Smoking status: Current Every Day Smoker    Packs/day: 0.50    Types: Cigarettes  . Smokeless tobacco: Never Used  Substance Use Topics  . Alcohol use: No  . Drug use: Yes    Types: "Crack" cocaine    Comment: crack-last smoked 2 wks ago-07-24-19, states she uses once a month     Colonoscopy: Never  PAP: Up-to-date  Bone density: never   Allergies  Allergen Reactions  . Morphine And Related Itching  . Ibuprofen Itching    Current Outpatient Medications  Medication Sig Dispense Refill  . albuterol (VENTOLIN HFA) 108 (90 Base) MCG/ACT inhaler     . amLODipine (NORVASC) 5 MG tablet Take 5 mg by mouth daily.    Marland Kitchen amoxicillin (AMOXIL) 500 MG tablet Take 500 mg by mouth 3 (three) times daily.    Marland Kitchen anastrozole (ARIMIDEX) 1 MG tablet Take 1 tablet (1 mg total) by mouth daily. 90 tablet 4  . gabapentin (NEURONTIN) 400 MG capsule Take 400 mg by mouth 3 (three) times daily.    . metFORMIN (GLUCOPHAGE) 500 MG tablet Take 500 mg by mouth 2 (two) times daily.    Marland Kitchen OVER THE COUNTER MEDICATION Take 1-2 tablets by mouth 2 (two) times daily as needed (for pain). OTC Pain Reliever PM    . rosuvastatin (CRESTOR) 20 MG tablet Take 20 mg by mouth daily.    . SYMBICORT 160-4.5 MCG/ACT inhaler     . tiZANidine (ZANAFLEX) 4 MG tablet Take 4 mg by mouth every 8 (eight) hours as needed.     No current facility-administered medications for this visit.    OBJECTIVE:   There were no vitals filed for this visit.   There is no height or weight on file to calculate BMI.   Wt Readings  from Last 3 Encounters:  09/10/19 275 lb 11.2 oz (125.1 kg)  08/22/19 276 lb (125.2 kg)  07/31/19 275 lb 9.2 oz (125 kg)      ECOG FS:2 - Symptomatic, <50% confined to bed Patient sounds well and is in no apparent distress.  Mood and behavior are normal.  Speech is normal.  Breathing is non labored.  LAB RESULTS:  CMP     Component Value Date/Time   NA 140 07/29/2019 0903   NA 141 01/03/2012 1730   K 5.2 (H) 07/29/2019 0903   K 3.0 (L) 01/03/2012 1730   CL 103 07/29/2019 0903   CL 104 01/03/2012 1730   CO2 26 07/29/2019 0903   CO2 31 01/03/2012 1730   GLUCOSE 135 (H) 07/29/2019 0903   GLUCOSE 92 01/03/2012 1730   BUN 12 07/29/2019 0903   BUN 12 01/03/2012 1730   CREATININE 0.81 07/29/2019 0903   CREATININE 0.86 07/02/2019 1457   CREATININE 0.94 01/03/2012 1730   CALCIUM 9.3 07/29/2019 0903  CALCIUM 8.4 (L) 01/03/2012 1730   PROT 7.5 07/02/2019 1457   PROT 7.7 01/03/2012 1730   ALBUMIN 3.9 07/02/2019 1457   ALBUMIN 3.4 01/03/2012 1730   AST 13 (L) 07/02/2019 1457   ALT 17 07/02/2019 1457   ALT 32 01/03/2012 1730   ALKPHOS 92 07/02/2019 1457   ALKPHOS 113 01/03/2012 1730   BILITOT 0.5 07/02/2019 1457   GFRNONAA >60 07/29/2019 0903   GFRNONAA >60 07/02/2019 1457   GFRNONAA >60 01/03/2012 1730   GFRAA >60 07/29/2019 0903   GFRAA >60 07/02/2019 1457   GFRAA >60 01/03/2012 1730    No results found for: TOTALPROTELP, ALBUMINELP, A1GS, A2GS, BETS, BETA2SER, GAMS, MSPIKE, SPEI  Lab Results  Component Value Date   WBC 8.7 07/02/2019   NEUTROABS 4.5 07/02/2019   HGB 15.7 (H) 07/02/2019   HCT 50.0 (H) 07/02/2019   MCV 94.5 07/02/2019   PLT 253 07/02/2019    No results found for: LABCA2  No components found for: KGURKY706  No results for input(s): INR in the last 168 hours.  No results found for: LABCA2  No results found for: CBJ628  No results found for: BTD176  No results found for: HYW737  No results found for: CA2729  No components found for:  HGQUANT  No results found for: CEA1 / No results found for: CEA1   No results found for: AFPTUMOR  No results found for: CHROMOGRNA  No results found for: KPAFRELGTCHN, LAMBDASER, KAPLAMBRATIO (kappa/lambda light chains)  No results found for: HGBA, HGBA2QUANT, HGBFQUANT, HGBSQUAN (Hemoglobinopathy evaluation)   No results found for: LDH  No results found for: IRON, TIBC, IRONPCTSAT (Iron and TIBC)  No results found for: FERRITIN  Urinalysis    Component Value Date/Time   COLORURINE YELLOW 08/21/2013 1847   APPEARANCEUR CLOUDY (A) 08/21/2013 1847   LABSPEC 1.027 08/21/2013 1847   PHURINE 5.5 08/21/2013 1847   GLUCOSEU NEGATIVE 08/21/2013 1847   HGBUR NEGATIVE 08/21/2013 1847   BILIRUBINUR NEGATIVE 08/21/2013 1847   KETONESUR 15 (A) 08/21/2013 1847   PROTEINUR NEGATIVE 08/21/2013 1847   UROBILINOGEN 1.0 08/21/2013 1847   NITRITE NEGATIVE 08/21/2013 1847   LEUKOCYTESUR SMALL (A) 08/21/2013 1847     STUDIES: No results found.   ELIGIBLE FOR AVAILABLE RESEARCH PROTOCOL: AET  ASSESSMENT: 64 y.o. [place] woman status post bilateral breast biopsies 06/03/2019 showing  (a) in the left breast, benign tissue, felt to be discordant    (b) in the right breast, a complex sclerosing lesion  (c) repeat left breast biopsy 06/14/2019 shows ductal carcinoma in situ, grade 2, estrogen and progesterone receptor positive  (1) status post bilateral lumpectomies 07/31/2019 showing  (a) in the right breast, an intraductal papilloma  (b) in the left breast, no residual carcinoma.  (2) adjuvant radiation from 09/02/2019-09/30/2019.  The patient initially received a dose of 42.56 Gy in 16 fractions to the breast using whole-breast tangent fields. This was delivered using a 3-D conformal technique. The patient then received a boost to the seroma. This delivered an additional 8 Gy in 65fractions using a 3 field photon technique due to the depth of the seroma. The total dose was 50.56  Gy.  (3) Anastrozole daily beginning 10/22/2019  PLAN: Caitlin Rivera     Follow up instructions:    -Return to cancer center in 05/2020  -Mammogram due in 04/2020   The patient was provided an opportunity to ask questions and all were answered. The patient agreed with the plan and demonstrated an understanding of the instructions.  I provided 12 minutes of non face-to-face telephone visit time during this encounter, and > 50% was spent counseling as documented under my assessment & plan.    Wilber Bihari, NP 12/11/19 2:14 PM Medical Oncology and Hematology [facility] [place], [place] [address] Tel. [phone]    Fax. [phone]   *Total Encounter Time as defined by the Centers for Medicare and Medicaid Services includes, in addition to the face-to-face time of a patient visit (documented in the note above) non-face-to-face time: obtaining and reviewing outside history, ordering and reviewing medications, tests or procedures, care coordination (communications with other health care professionals or caregivers) and documentation in the medical record.

## 2019-12-12 ENCOUNTER — Telehealth: Payer: Self-pay | Admitting: Adult Health

## 2019-12-12 NOTE — Telephone Encounter (Signed)
Appts requested in 12/1 los were already on pt's schedule from previous LOS. Made no changes to pt's schedule.

## 2020-01-01 ENCOUNTER — Other Ambulatory Visit (HOSPITAL_BASED_OUTPATIENT_CLINIC_OR_DEPARTMENT_OTHER): Payer: Self-pay

## 2020-01-01 DIAGNOSIS — E669 Obesity, unspecified: Secondary | ICD-10-CM

## 2020-01-01 DIAGNOSIS — I10 Essential (primary) hypertension: Secondary | ICD-10-CM

## 2020-01-13 ENCOUNTER — Ambulatory Visit (HOSPITAL_BASED_OUTPATIENT_CLINIC_OR_DEPARTMENT_OTHER): Payer: Medicaid Other | Attending: Physician Assistant | Admitting: Internal Medicine

## 2020-01-13 ENCOUNTER — Other Ambulatory Visit: Payer: Self-pay

## 2020-01-13 VITALS — Ht 67.0 in | Wt 264.0 lb

## 2020-01-13 DIAGNOSIS — G4736 Sleep related hypoventilation in conditions classified elsewhere: Secondary | ICD-10-CM | POA: Insufficient documentation

## 2020-01-13 DIAGNOSIS — E669 Obesity, unspecified: Secondary | ICD-10-CM | POA: Insufficient documentation

## 2020-01-13 DIAGNOSIS — Z6841 Body Mass Index (BMI) 40.0 and over, adult: Secondary | ICD-10-CM | POA: Insufficient documentation

## 2020-01-13 DIAGNOSIS — I10 Essential (primary) hypertension: Secondary | ICD-10-CM

## 2020-01-13 DIAGNOSIS — Z7984 Long term (current) use of oral hypoglycemic drugs: Secondary | ICD-10-CM | POA: Diagnosis not present

## 2020-01-13 DIAGNOSIS — R0683 Snoring: Secondary | ICD-10-CM | POA: Insufficient documentation

## 2020-01-13 DIAGNOSIS — Z79899 Other long term (current) drug therapy: Secondary | ICD-10-CM | POA: Insufficient documentation

## 2020-01-19 DIAGNOSIS — E669 Obesity, unspecified: Secondary | ICD-10-CM

## 2020-01-19 NOTE — Procedures (Signed)
   Patient Name: Caitlin Rivera, Caitlin Rivera Date: 01/13/2020 Gender: Female D.O.B: [birth year] Age (years): 64 Referring Provider: Raelyn Number Height (inches): 59 Interpreting Physician: Baird Lyons MD, ABSM Weight (lbs): 264 RPSGT: Zadie Rhine BMI: 41 MRN: [id number] Neck Size: 17.00  CLINICAL INFORMATION Sleep Study Type: NPSG Indication for sleep study: Obesity, Snoring Epworth Sleepiness Score: 22  SLEEP STUDY TECHNIQUE As per the AASM Manual for the Scoring of Sleep and Associated Events v2.3 (April 2016) with a hypopnea requiring 4% desaturations.  The channels recorded and monitored were frontal, central and occipital EEG, electrooculogram (EOG), submentalis EMG (chin), nasal and oral airflow, thoracic and abdominal wall motion, anterior tibialis EMG, snore microphone, electrocardiogram, and pulse oximetry.  MEDICATIONS Medications self-administered by patient taken the night of the study : NEURONTIN, METFORMIN, [place] The study was initiated at 9:46:53 PM and ended at 4:41:01 AM.  Sleep onset time was 3.3 minutes and the sleep efficiency was 88.1%%. The total sleep time was 365 minutes.  Stage REM latency was 70.5 minutes.  The patient spent 9.6%% of the night in stage N1 sleep, 72.1%% in stage N2 sleep, 0.0%% in stage N3 and 18.4% in REM.  Alpha intrusion was absent.  Supine sleep was 12.08%.  RESPIRATORY PARAMETERS The overall apnea/hypopnea index (AHI) was 0.2 per hour. There were 0 total apneas, including 0 obstructive, 0 central and 0 mixed apneas. There were 1 hypopneas and 10 RERAs.  The AHI during Stage REM sleep was 0.9 per hour.  AHI while supine was 1.4 per hour.  The mean oxygen saturation was 91.4%. The minimum SpO2 during sleep was 81.0%.  moderate snoring was noted during this study.  CARDIAC DATA The 2 lead EKG demonstrated sinus rhythm. The mean heart rate was 75.9 beats per minute. Other EKG findings include:  None.  LEG MOVEMENT DATA The total PLMS were 0 with a resulting PLMS index of 0.0. Associated arousal with leg movement index was 1.6 .  IMPRESSIONS - No significant obstructive sleep apnea occurred during this study (AHI = 0.2/h). - No significant central sleep apnea occurred during this study (CAI = 0.0/h). - Oxygen desaturation was noted.  Tech added supplemental O2 at 1L at 12:42AM for sustained low saturation in the 80's, per protocol. Subsequent Mean O2 sat on O2 1L was 91.5%. - The patient snored with moderate snoring volume. - No cardiac abnormalities were noted during this study. - Limb Movemt total 311 (51.1/ hr). Limb movements with arousal total 10 (1.8/ hr).  DIAGNOSIS - Nocturnal Hypoxemia (G47.36) - Primary Snoring  RECOMMENDATIONS - Assess basis for Nocturnal Hypoxemia and consider overnight oximetry. - Limb movements without clear sleep disturbance were common. Consider if therapeutic trial of Requip or Mirapex would be appropriate. - Be careful with alcohol, sedatives and other CNS depressants that may worsen sleep apnea and disrupt normal sleep architecture. - Sleep hygiene should be reviewed to assess factors that may improve sleep quality. - Weight management and regular exercise should be initiated or continued if appropriate.                           [place], Tax adviser of Sleep Medicine  ELECTRONICALLY SIGNED ON:  01/19/2020, 12:12 PM [place] PH: [phone]   FX: [phone] [place]

## 2020-02-25 ENCOUNTER — Emergency Department (HOSPITAL_COMMUNITY)
Admission: EM | Admit: 2020-02-25 | Discharge: 2020-02-25 | Disposition: A | Discharge status: Home / Self Care | Payer: Medicaid Other | Attending: Emergency Medicine | Admitting: Emergency Medicine

## 2020-02-25 ENCOUNTER — Other Ambulatory Visit: Payer: Self-pay

## 2020-02-25 ENCOUNTER — Emergency Department (HOSPITAL_COMMUNITY): Payer: Medicaid Other

## 2020-02-25 ENCOUNTER — Encounter (HOSPITAL_COMMUNITY): Payer: Self-pay | Admitting: Emergency Medicine

## 2020-02-25 DIAGNOSIS — M62838 Other muscle spasm: Secondary | ICD-10-CM | POA: Diagnosis not present

## 2020-02-25 DIAGNOSIS — Z7984 Long term (current) use of oral hypoglycemic drugs: Secondary | ICD-10-CM | POA: Insufficient documentation

## 2020-02-25 DIAGNOSIS — Z853 Personal history of malignant neoplasm of breast: Secondary | ICD-10-CM | POA: Diagnosis not present

## 2020-02-25 DIAGNOSIS — M25512 Pain in left shoulder: Secondary | ICD-10-CM | POA: Diagnosis not present

## 2020-02-25 DIAGNOSIS — E119 Type 2 diabetes mellitus without complications: Secondary | ICD-10-CM | POA: Diagnosis not present

## 2020-02-25 DIAGNOSIS — F17210 Nicotine dependence, cigarettes, uncomplicated: Secondary | ICD-10-CM | POA: Diagnosis not present

## 2020-02-25 DIAGNOSIS — Z79899 Other long term (current) drug therapy: Secondary | ICD-10-CM | POA: Insufficient documentation

## 2020-02-25 DIAGNOSIS — J449 Chronic obstructive pulmonary disease, unspecified: Secondary | ICD-10-CM | POA: Insufficient documentation

## 2020-02-25 DIAGNOSIS — M542 Cervicalgia: Secondary | ICD-10-CM | POA: Diagnosis present

## 2020-02-25 DIAGNOSIS — I10 Essential (primary) hypertension: Secondary | ICD-10-CM | POA: Insufficient documentation

## 2020-02-25 LAB — CBC
HCT: 46.7 % — ABNORMAL HIGH (ref 36.0–46.0)
Hemoglobin: 15 g/dL (ref 12.0–15.0)
MCH: 29.7 pg (ref 26.0–34.0)
MCHC: 32.1 g/dL (ref 30.0–36.0)
MCV: 92.5 fL (ref 80.0–100.0)
Platelets: 223 10*3/uL (ref 150–400)
RBC: 5.05 MIL/uL (ref 3.87–5.11)
RDW: 14.4 % (ref 11.5–15.5)
WBC: 5.7 10*3/uL (ref 4.0–10.5)
nRBC: 0 % (ref 0.0–0.2)

## 2020-02-25 LAB — BASIC METABOLIC PANEL
Anion gap: 11 (ref 5–15)
BUN: 11 mg/dL (ref 8–23)
CO2: 26 mmol/L (ref 22–32)
Calcium: 9.4 mg/dL (ref 8.9–10.3)
Chloride: 102 mmol/L (ref 98–111)
Creatinine, Ser: 0.71 mg/dL (ref 0.44–1.00)
GFR, Estimated: >60 mL/min (ref >60)
Glucose, Bld: 145 mg/dL — ABNORMAL HIGH (ref 70–99)
Potassium: 3.8 mmol/L (ref 3.5–5.1)
Sodium: 139 mmol/L (ref 135–145)

## 2020-02-25 LAB — TROPONIN I (HIGH SENSITIVITY)
Troponin I (High Sensitivity): 6 ng/L (ref <18)
Troponin I (High Sensitivity): 7 ng/L (ref <18)

## 2020-02-25 MED ORDER — NAPROXEN 250 MG PO TABS
500.0000 mg | ORAL_TABLET | Freq: Once | ORAL | Status: DC
  Filled 2020-02-25: qty 2

## 2020-02-25 MED ORDER — ACETAMINOPHEN 325 MG PO TABS
650.0000 mg | ORAL_TABLET | Freq: Once | ORAL | Status: AC
  Administered 2020-02-25: 650 mg via ORAL
  Filled 2020-02-25: qty 2

## 2020-02-25 MED ORDER — LIDOCAINE 4 % EX PTCH: 1.0000 | MEDICATED_PATCH | Freq: Two times a day (BID) | CUTANEOUS | 0 refills | Status: DC

## 2020-02-25 MED ORDER — ACETAMINOPHEN ER 650 MG PO TBCR: 650.0000 mg | EXTENDED_RELEASE_TABLET | Freq: Three times a day (TID) | ORAL | 0 refills | Status: DC | PRN

## 2020-02-25 MED ORDER — LIDOCAINE 5 % EX PTCH
1.0000 | MEDICATED_PATCH | CUTANEOUS | Status: DC
  Administered 2020-02-25: 1 via TRANSDERMAL
  Filled 2020-02-25: qty 1

## 2020-02-25 MED ORDER — TRAMADOL HCL 50 MG PO TABS
100.0000 mg | ORAL_TABLET | Freq: Once | ORAL | Status: AC
  Administered 2020-02-25: 100 mg via ORAL
  Filled 2020-02-25: qty 2

## 2020-02-25 NOTE — ED Provider Notes (Signed)
Caitlin Rivera Provider Note   CSN: [id number] Arrival date & time: 02/25/20  4709     History Chief Complaint  Patient presents with  . Shoulder Pain  . Neck Pain    Caitlin Rivera is a 65 y.o. female.  HPI     65 year old female comes in a chief complaint of shoulder pain and neck pain. Patient has history of diabetes, COPD and left breast cancer.  She is currently not getting chemo or radiation.  She reports about 2 weeks of left shoulder and neck pain.  She had a fall from bed prior to the pain starting.  The pain is located primarily over the posterior aspect of the neck, shoulder and radiates down to her elbow.  She has no associated numbness, tingling.  Patient has no history of similar pain.  Over-the-counter medications have not helped.  She has a muscle relaxant at home which has not helped.  Past Medical History:  Diagnosis Date  . Asthma   . Cancer ([facility]) 06/2019   left breast DCIS  . COPD (chronic obstructive pulmonary disease) ([facility])    smoker  . Diabetes mellitus without complication ([facility])   . Fibroids   . Hypertension   . Smoker     Patient Active Problem List   Diagnosis Date Noted  . Morbid obesity with BMI of 40.0-44.9, adult ([place]) 07/02/2019  . Type 2 diabetes mellitus with obesity ([facility]) 07/02/2019  . Hypercholesterolemia 07/02/2019  . Hypertension 07/02/2019  . Malignant neoplasm of lower-inner quadrant of left breast in female, estrogen receptor positive ([place]) 06/26/2019    Past Surgical History:  Procedure Laterality Date  . BREAST LUMPECTOMY WITH RADIOACTIVE SEED LOCALIZATION Bilateral 07/31/2019   Procedure: BILATERAL BREAST LUMPECTOMY WITH RADIOACTIVE SEED LOCALIZATION;  Surgeon: Jovita Kussmaul, MD;  Location: [facility];  Service: General;  Laterality: Bilateral;  . TUBAL LIGATION    . TUBAL LIGATION       OB History   No obstetric history on file.     Family History  Problem  Relation Age of Onset  . Cancer Mother     Social History   Tobacco Use  . Smoking status: Current Every Day Smoker    Packs/day: 0.50    Types: Cigarettes  . Smokeless tobacco: Never Used  Substance Use Topics  . Alcohol use: No  . Drug use: Yes    Types: "Crack" cocaine    Comment: crack-last smoked 2 wks ago-07-24-19, states she uses once a month    Home Medications Prior to Admission medications   Medication Sig Start Date End Date Taking? Authorizing Provider  acetaminophen (TYLENOL 8 HOUR) 650 MG CR tablet Take 1 tablet (650 mg total) by mouth every 8 (eight) hours as needed for pain or fever. 02/25/20  Yes Tiffany Talarico, MD  Lidocaine 4 % PTCH Apply 1 patch topically 2 (two) times daily. 02/25/20  Yes Varney Biles, MD  albuterol (VENTOLIN HFA) 108 (90 Base) MCG/ACT inhaler  05/29/19   [provider]  amLODipine (NORVASC) 5 MG tablet Take 5 mg by mouth daily. 06/30/19   [provider]  amoxicillin (AMOXIL) 500 MG tablet Take 500 mg by mouth 3 (three) times daily. 08/21/19   [provider]  anastrozole (ARIMIDEX) 1 MG tablet Take 1 tablet (1 mg total) by mouth daily. 09/10/19   Magrinat, Virgie Dad, MD  gabapentin (NEURONTIN) 400 MG capsule Take 400 mg by mouth 3 (three) times daily. 06/17/19  [provider]  metFORMIN (GLUCOPHAGE) 500 MG tablet Take 500 mg by mouth 2 (two) times daily. 05/26/19   [provider]  OVER THE COUNTER MEDICATION Take 1-2 tablets by mouth 2 (two) times daily as needed (for pain). OTC Pain Reliever PM    [provider]  rosuvastatin (CRESTOR) 20 MG tablet Take 20 mg by mouth daily. 06/30/19   [provider]  SYMBICORT 160-4.5 MCG/ACT inhaler  05/31/19   [provider]  tiZANidine (ZANAFLEX) 4 MG tablet Take 4 mg by mouth every 8 (eight) hours as needed. 06/17/19   [provider]    Allergies    Morphine and related and Ibuprofen  Review of Systems   Review of Systems   Constitutional: Positive for activity change.  Respiratory: Negative for shortness of breath.   Cardiovascular: Negative for chest pain.  Gastrointestinal: Negative for nausea and vomiting.  Musculoskeletal: Positive for arthralgias and myalgias.  Hematological: Does not bruise/bleed easily.  All other systems reviewed and are negative.   Physical Exam Updated Vital Signs BP 131/68   Pulse 82   Temp 98.1 F (36.7 C)   Resp 20   SpO2 97%   Physical Exam Vitals and nursing note reviewed.  Constitutional:      Appearance: She is well-developed.  HENT:     Head: Normocephalic and atraumatic.  Eyes:     Extraocular Movements: EOM normal.  Cardiovascular:     Rate and Rhythm: Normal rate.     Comments: 2+ radial pulse bilaterally Pulmonary:     Effort: Pulmonary effort is normal.  Abdominal:     General: Bowel sounds are normal.  Musculoskeletal:     Cervical back: Normal range of motion and neck supple.     Comments: Reproducible tenderness with palpation of the neck left scapular region.  Patient is noted to have large spasms over the scapular region.  Skin:    General: Skin is warm and dry.  Neurological:     Mental Status: She is alert and oriented to person, place, and time.     ED Results / Procedures / Treatments   Labs (all labs ordered are listed, but only abnormal results are displayed) Labs Reviewed  BASIC METABOLIC PANEL - Abnormal; Notable for the following components:      Result Value   Glucose, Bld 145 (*)    All other components within normal limits  CBC - Abnormal; Notable for the following components:   HCT 46.7 (*)    All other components within normal limits  TROPONIN I (HIGH SENSITIVITY)  TROPONIN I (HIGH SENSITIVITY)    EKG EKG Interpretation  Date/Time:  Tuesday February 25 2020 08:24:56 EST Ventricular Rate:  80 PR Interval:  152 QRS Duration: 68 QT Interval:  382 QTC Calculation: 440 R Axis:   58 Text Interpretation: Normal  sinus rhythm Normal ECG No acute changes No significant change since last tracing Confirmed by Varney Biles [phone]) on 02/25/2020 12:45:59 PM   Radiology DG Chest 2 View  Result Date: 02/25/2020 CLINICAL DATA:  Chest pain EXAM: CHEST - 2 VIEW COMPARISON:  03/15/2015 FINDINGS: Normal heart size and mediastinal contours. Stable prominent lung markings. No acute infiltrate or edema. No effusion or pneumothorax. No acute osseous findings. Midthoracic disc degeneration. IMPRESSION: Stable exam.  No evidence of acute disease. Electronically Signed   By: Monte Fantasia M.D.   On: 02/25/2020 09:06    Procedures Procedures   Medications Ordered in ED Medications  lidocaine (LIDODERM)  5 % 1 patch (1 patch Transdermal Patch Applied 02/25/20 1247)  acetaminophen (TYLENOL) tablet 650 mg (650 mg Oral Given 02/25/20 1245)  traMADol (ULTRAM) tablet 100 mg (100 mg Oral Given 02/25/20 1245)    ED Course  I have reviewed the triage vital signs and the nursing notes.  Pertinent labs & imaging results that were available during my care of the patient were reviewed by me and considered in my medical decision making (see chart for details).    MDM Rules/Calculators/A&P                          65 year old comes in a chief complaint of neck and shoulder pain. EKG is reassuring.  History is not concerning or consistent with ACS.  High-sensitivity troponin levels ordered is also negative.  On exam, patient has clearly reproducible tenderness over the left scapular region and left deltoid region.  Symptoms are also reproduced with movement of the upper extremity against resistance.  She has mild paraspinal cervical spine tenderness as well.  X-ray of the chest does not reveal any dislocation, fracture which patient was concerned about of the left shoulder.  She might have degenerative disc disease of the cervical spine or arthritis of the left shoulder -however, I do not think he needs emergent work-up.  We  have advised PCP follow-up, she might need skilled therapy to further help.  Final Clinical Impression(s) / ED Diagnoses Final diagnoses:  Muscle spasm  Cervical paraspinal muscle spasm    Rx / DC Orders ED Discharge Orders         Ordered    acetaminophen (TYLENOL 8 HOUR) 650 MG CR tablet  Every 8 hours PRN        02/25/20 1250    Lidocaine 4 % PTCH  2 times daily        02/25/20 1250           Varney Biles, MD 02/25/20 1254

## 2020-02-25 NOTE — ED Triage Notes (Signed)
Pt reports L shoulder and neck pain x 3 weeks that is worse with movement.  Also reports SOB.  Denies nausea and vomiting.

## 2020-02-25 NOTE — ED Notes (Signed)
Pt ambulated well to BR

## 2020-02-25 NOTE — Discharge Instructions (Addendum)
You are seen in the ER for shoulder and neck pain.  Your symptoms appear to be related to muscle spasms. It is possible that you have arthritis of the neck and shoulder.  The x-ray is not showing any evidence of fracture or dislocation of your shoulder.  We recommend that you see your primary care doctor this week. Until then, perform the stretching exercises we discussed and take the medications prescribed. If you are not getting better, then you might need physical therapy.

## 2020-03-12 ENCOUNTER — Other Ambulatory Visit (HOSPITAL_COMMUNITY): Payer: Self-pay | Admitting: Family

## 2020-03-12 DIAGNOSIS — R1084 Generalized abdominal pain: Secondary | ICD-10-CM

## 2020-03-12 DIAGNOSIS — G8929 Other chronic pain: Secondary | ICD-10-CM

## 2020-03-23 ENCOUNTER — Ambulatory Visit (HOSPITAL_COMMUNITY): Payer: Medicaid Other

## 2020-03-30 ENCOUNTER — Encounter (HOSPITAL_COMMUNITY): Payer: Self-pay

## 2020-03-30 ENCOUNTER — Ambulatory Visit (HOSPITAL_COMMUNITY): Payer: Medicaid Other

## 2020-04-24 ENCOUNTER — Ambulatory Visit
Admission: RE | Admit: 2020-04-24 | Discharge: 2020-04-24 | Disposition: A | Discharge status: Home / Self Care | Payer: Medicaid Other | Source: Ambulatory Visit | Attending: Adult Health | Admitting: Adult Health

## 2020-04-24 ENCOUNTER — Other Ambulatory Visit: Payer: Self-pay

## 2020-04-24 DIAGNOSIS — C50312 Malignant neoplasm of lower-inner quadrant of left female breast: Secondary | ICD-10-CM

## 2020-04-24 DIAGNOSIS — Z170 Estrogen receptor positive status [ER+]: Secondary | ICD-10-CM

## 2020-04-24 HISTORY — DX: Personal history of irradiation: Z92.3

## 2020-05-18 ENCOUNTER — Other Ambulatory Visit: Payer: Self-pay | Admitting: *Deleted

## 2020-05-18 DIAGNOSIS — C50312 Malignant neoplasm of lower-inner quadrant of left female breast: Secondary | ICD-10-CM

## 2020-05-18 NOTE — Progress Notes (Signed)
[place]  [phone] [phone]     ID: Caitlin Rivera Day Ho DOB: 1955-04-01  MR#: [id number]  PXT#:[id number]  Patient Care Team: Benito Mccreedy, MD as PCP - General (Internal Medicine) Magrinat, Virgie Dad, MD as Consulting Physician (Oncology) Erroll Luna, MD as Consulting Physician (General Surgery) Mauro Kaufmann, RN as Oncology Nurse Navigator Rockwell Germany, RN as Oncology Nurse Navigator Kyung Rudd, MD as Consulting Physician (Radiation Oncology) Aurea Graff OTHER MD:  CHIEF COMPLAINT: Estrogen receptor positive noninvasive breast cancer  CURRENT TREATMENT: Anastrozole   INTERVAL HISTORY: Caitlin Rivera returns today for follow up of her noninvasive breast cancer.   She started anastrozole on 10/22/2019.  There is no bone density screening on file.  Since her last visit, she underwent bilateral diagnostic mammography with tomography at [facility] on 04/24/2020 showing: breast density category B; no evidence of malignancy in either breast.   She presented to the emergency room February 2022 with left shoulder pain.  Chest x-ray and work-up were benign.  REVIEW OF SYSTEMS: Caitlin Rivera    COVID 25 VACCINATION STATUS:    HISTORY OF CURRENT ILLNESS: From the original intake note:  "Caitlin Rivera" had routine screening mammography on 04/23/2019 showing a possible abnormality in the bilateral breasts. She underwent bilateral diagnostic mammography with tomography and bilateral breast ultrasonography at [facility] on 05/01/2019 showing: breast density category B; persistent lower-inner left breast asymmetry with no ultrasound correlate; superficial 0.7 cm right breast mass at 3 o'clock.  Accordingly on 06/03/2019 she proceeded to biopsy of the bilateral breast areas in question. The pathology from this procedure ([id number]) showed:  1. Left breast  - benign breast parenchyma 2. Right breast  - sclerosing papillary lesion   The  left breast diagnosis was found to be discordant, and a repeat biopsy was recommended. This was performed on 06/14/2019, with pathology [id number]) showing: ductal carcinoma in situ with calcifications, intermediate grade, involving an underlying complex sclerosing lesion. Prognostic indicators significant for: estrogen receptor, 95% positive and progesterone receptor, 85% positive, both with strong staining intensity.  The patient's subsequent history is as detailed below.   PAST MEDICAL HISTORY: Past Medical History:  Diagnosis Date  . Asthma   . Cancer ([place]) 06/2019   left breast DCIS  . COPD (chronic obstructive pulmonary disease) ([place])    smoker  . Diabetes mellitus without complication ([place])   . Fibroids   . Hypertension   . Personal history of radiation therapy   . Smoker   Hypertension hypercholesterolemia type 2 diabetes mellitus morbid obesity and possibly hepatitis.   PAST SURGICAL HISTORY: Past Surgical History:  Procedure Laterality Date  . BREAST BIOPSY Bilateral 06/03/2019  . BREAST BIOPSY Left 06/14/2019  . BREAST EXCISIONAL BIOPSY Right 07/2019  . BREAST LUMPECTOMY Left 07/2019  . BREAST LUMPECTOMY WITH RADIOACTIVE SEED LOCALIZATION Bilateral 07/31/2019   Procedure: BILATERAL BREAST LUMPECTOMY WITH RADIOACTIVE SEED LOCALIZATION;  Surgeon: Jovita Kussmaul, MD;  Location: [facility];  Service: General;  Laterality: Bilateral;  . TUBAL LIGATION    . TUBAL LIGATION    Possible appendectomy   FAMILY HISTORY: Family History  Problem Relation Age of Onset  . Cancer Mother    The patient's father is 26 years old as of June 2021.  Of 11 siblings 1 sister had cancer of unknown type.  The patient's mother died from "stomach "cancer at the age of 20.  One maternal uncle had throat cancer.  The patient herself has a total of  8 siblings, none with cancer   GYNECOLOGIC HISTORY:  No LMP recorded. Patient is postmenopausal. Menarche: 65 years old Age at first  live birth: 65 years old [place] P  3 LMP 60 Contraceptive yes, several years, without complications HRT no Hysterectomy?  No BSO?  No no   SOCIAL HISTORY: (updated 06/2019)  Caitlin Rivera used to work in [employer], but is now disabled.  She moved to this area from [place] to be closer to her oldest daughter Varney Biles.  Patient is a homemaker.  Daughter Donald Prose works in a Materials engineer in [place].  Daughter Donella Stade also lives in [place] but is disabled.  The patient has 9 grandchildren.  She has no great-grandchildren.  She attends a [employer].   ADVANCED DIRECTIVES:   HEALTH MAINTENANCE: Social History   Tobacco Use  . Smoking status: Current Every Day Smoker    Packs/day: 0.50    Types: Cigarettes  . Smokeless tobacco: Never Used  Substance Use Topics  . Alcohol use: No  . Drug use: Yes    Types: "Crack" cocaine    Comment: crack-last smoked 2 wks ago-07-24-19, states she uses once a month     Colonoscopy: Never  PAP: Up-to-date  Bone density: never   Allergies  Allergen Reactions  . Morphine And Related Itching  . Ibuprofen Itching    Current Outpatient Medications  Medication Sig Dispense Refill  . acetaminophen (TYLENOL 8 HOUR) 650 MG CR tablet Take 1 tablet (650 mg total) by mouth every 8 (eight) hours as needed for pain or fever. 30 tablet 0  . albuterol (VENTOLIN HFA) 108 (90 Base) MCG/ACT inhaler     . amLODipine (NORVASC) 5 MG tablet Take 5 mg by mouth daily.    Marland Kitchen amoxicillin (AMOXIL) 500 MG tablet Take 500 mg by mouth 3 (three) times daily.    Marland Kitchen anastrozole (ARIMIDEX) 1 MG tablet Take 1 tablet (1 mg total) by mouth daily. 90 tablet 4  . gabapentin (NEURONTIN) 400 MG capsule Take 400 mg by mouth 3 (three) times daily.    . Lidocaine 4 % PTCH Apply 1 patch topically 2 (two) times daily. 12 patch 0  . metFORMIN (GLUCOPHAGE) 500 MG tablet Take 500 mg by mouth 2 (two) times daily.    Marland Kitchen OVER THE COUNTER MEDICATION Take 1-2 tablets by mouth 2 (two) times daily as needed (for  pain). OTC Pain Reliever PM    . rosuvastatin (CRESTOR) 20 MG tablet Take 20 mg by mouth daily.    . SYMBICORT 160-4.5 MCG/ACT inhaler     . tiZANidine (ZANAFLEX) 4 MG tablet Take 4 mg by mouth every 8 (eight) hours as needed.     No current facility-administered medications for this visit.    OBJECTIVE:   There were no vitals filed for this visit.   There is no height or weight on file to calculate BMI.   Wt Readings from Last 3 Encounters:  01/13/20 264 lb (119.7 kg)  09/10/19 275 lb 11.2 oz (125.1 kg)  08/22/19 276 lb (125.2 kg)      ECOG FS:2 - Symptomatic, <50% confined to bed   LAB RESULTS:  CMP     Component Value Date/Time   NA 139 02/25/2020 0831   NA 141 01/03/2012 1730   K 3.8 02/25/2020 0831   K 3.0 (L) 01/03/2012 1730   CL 102 02/25/2020 0831   CL 104 01/03/2012 1730   CO2 26 02/25/2020 0831   CO2 31 01/03/2012 1730   GLUCOSE 145 (  H) 02/25/2020 0831   GLUCOSE 92 01/03/2012 1730   BUN 11 02/25/2020 0831   BUN 12 01/03/2012 1730   CREATININE 0.71 02/25/2020 0831   CREATININE 0.86 07/02/2019 1457   CREATININE 0.94 01/03/2012 1730   CALCIUM 9.4 02/25/2020 0831   CALCIUM 8.4 (L) 01/03/2012 1730   PROT 7.5 07/02/2019 1457   PROT 7.7 01/03/2012 1730   ALBUMIN 3.9 07/02/2019 1457   ALBUMIN 3.4 01/03/2012 1730   AST 13 (L) 07/02/2019 1457   ALT 17 07/02/2019 1457   ALT 32 01/03/2012 1730   ALKPHOS 92 07/02/2019 1457   ALKPHOS 113 01/03/2012 1730   BILITOT 0.5 07/02/2019 1457   GFRNONAA >60 02/25/2020 0831   GFRNONAA >60 07/02/2019 1457   GFRNONAA >60 01/03/2012 1730   GFRAA >60 07/29/2019 0903   GFRAA >60 07/02/2019 1457   GFRAA >60 01/03/2012 1730    No results found for: TOTALPROTELP, ALBUMINELP, A1GS, A2GS, BETS, BETA2SER, GAMS, MSPIKE, SPEI  Lab Results  Component Value Date   WBC 5.7 02/25/2020   NEUTROABS 4.5 07/02/2019   HGB 15.0 02/25/2020   HCT 46.7 (H) 02/25/2020   MCV 92.5 02/25/2020   PLT 223 02/25/2020    No results found for:  LABCA2  No components found for: UXLKGM010  No results for input(s): INR in the last 168 hours.  No results found for: LABCA2  No results found for: UVO536  No results found for: UYQ034  No results found for: VQQ595  No results found for: CA2729  No components found for: HGQUANT  No results found for: CEA1 / No results found for: CEA1   No results found for: AFPTUMOR  No results found for: CHROMOGRNA  No results found for: KPAFRELGTCHN, LAMBDASER, KAPLAMBRATIO (kappa/lambda light chains)  No results found for: HGBA, HGBA2QUANT, HGBFQUANT, HGBSQUAN (Hemoglobinopathy evaluation)   No results found for: LDH  No results found for: IRON, TIBC, IRONPCTSAT (Iron and TIBC)  No results found for: FERRITIN  Urinalysis    Component Value Date/Time   COLORURINE YELLOW 08/21/2013 1847   APPEARANCEUR CLOUDY (A) 08/21/2013 1847   LABSPEC 1.027 08/21/2013 1847   PHURINE 5.5 08/21/2013 1847   GLUCOSEU NEGATIVE 08/21/2013 1847   HGBUR NEGATIVE 08/21/2013 1847   BILIRUBINUR NEGATIVE 08/21/2013 1847   KETONESUR 15 (A) 08/21/2013 1847   PROTEINUR NEGATIVE 08/21/2013 1847   UROBILINOGEN 1.0 08/21/2013 1847   NITRITE NEGATIVE 08/21/2013 1847   LEUKOCYTESUR SMALL (A) 08/21/2013 1847    STUDIES: MM DIAG BREAST TOMO BILATERAL  Result Date: 04/24/2020 CLINICAL DATA:  Right surgical excision.  Left lumpectomy. EXAM: DIGITAL DIAGNOSTIC BILATERAL MAMMOGRAM WITH TOMOSYNTHESIS AND CAD TECHNIQUE: Bilateral digital diagnostic mammography and breast tomosynthesis was performed. The images were evaluated with computer-aided detection. COMPARISON:  Previous exam(s). ACR Breast Density Category b: There are scattered areas of fibroglandular density. FINDINGS: No suspicious masses, calcifications, or distortion identified. The left lumpectomy site appears as expected. Right postsurgical changes are noted. IMPRESSION: No mammographic evidence of malignancy. RECOMMENDATION: Annual diagnostic  mammography. I have discussed the findings and recommendations with the patient. If applicable, a reminder letter will be sent to the patient regarding the next appointment. BI-RADS CATEGORY  2: Benign. Electronically Signed   By: Dorise Bullion III M.D   On: 04/24/2020 10:58     ELIGIBLE FOR AVAILABLE RESEARCH PROTOCOL: AET  ASSESSMENT: 65 y.o. [place] woman status post bilateral breast biopsies 06/03/2019 showing  (a) in the left breast, benign tissue, felt to be discordant    (b) in the  right breast, a complex sclerosing lesion  (c) repeat left breast biopsy 06/14/2019 shows ductal carcinoma in situ, grade 2, estrogen and progesterone receptor positive  (1) status post bilateral lumpectomies 07/31/2019 showing  (a) in the right breast, an intraductal papilloma  (b) in the left breast, no residual carcinoma.  (2) adjuvant radiation from 09/02/2019-09/30/2019.  The patient initially received a dose of 42.56 Gy in 16 fractions to the breast using whole-breast tangent fields. This was delivered using a 3-D conformal technique. The patient then received a boost to the seroma. This delivered an additional 8 Gy in 36fractions using a 3 field photon technique due to the depth of the seroma. The total dose was 50.56 Gy.  (3) Anastrozole daily beginning 10/22/2019   PLAN: Caitlin Rivera did not show for her 05/19/2020 appointment.  Follow-up letter has been sent   Sarajane Jews C. Magrinat, MD 05/18/20 8:10 PM Medical Oncology and Hematology [facility] [place], [place] [address] Tel. [phone]    Fax. [phone]   I, Wilburn Mylar, am acting as scribe for Dr. Virgie Dad. Magrinat.  I, Lurline Del MD, have reviewed the above documentation for accuracy and completeness, and I agree with the above.    *Total Encounter Time as defined by the Centers for Medicare and Medicaid Services includes, in addition to the face-to-face time of a patient visit (documented in  the note above) non-face-to-face time: obtaining and reviewing outside history, ordering and reviewing medications, tests or procedures, care coordination (communications with other health care professionals or caregivers) and documentation in the medical record.

## 2020-05-19 ENCOUNTER — Inpatient Hospital Stay: Payer: Medicaid Other | Attending: Oncology | Admitting: Oncology

## 2020-05-19 ENCOUNTER — Inpatient Hospital Stay: Payer: Medicaid Other

## 2020-05-19 ENCOUNTER — Encounter: Payer: Self-pay | Admitting: Oncology

## 2020-05-19 DIAGNOSIS — C50312 Malignant neoplasm of lower-inner quadrant of left female breast: Secondary | ICD-10-CM

## 2020-05-19 DIAGNOSIS — Z170 Estrogen receptor positive status [ER+]: Secondary | ICD-10-CM

## 2020-05-28 ENCOUNTER — Telehealth: Payer: Self-pay | Admitting: Oncology

## 2020-05-28 NOTE — Telephone Encounter (Signed)
Scheduled appts per 5/18 sch msg. Pt aware.

## 2020-07-14 ENCOUNTER — Inpatient Hospital Stay (HOSPITAL_BASED_OUTPATIENT_CLINIC_OR_DEPARTMENT_OTHER): Payer: Medicaid Other | Admitting: Oncology

## 2020-07-14 ENCOUNTER — Inpatient Hospital Stay: Payer: Medicaid Other | Attending: Oncology

## 2020-07-14 ENCOUNTER — Other Ambulatory Visit: Payer: Self-pay

## 2020-07-14 VITALS — BP 111/55 | HR 62 | Temp 96.3°F | Resp 18 | Wt 255.2 lb

## 2020-07-14 DIAGNOSIS — Z923 Personal history of irradiation: Secondary | ICD-10-CM | POA: Diagnosis not present

## 2020-07-14 DIAGNOSIS — C50312 Malignant neoplasm of lower-inner quadrant of left female breast: Secondary | ICD-10-CM | POA: Diagnosis not present

## 2020-07-14 DIAGNOSIS — I10 Essential (primary) hypertension: Secondary | ICD-10-CM | POA: Diagnosis not present

## 2020-07-14 DIAGNOSIS — Z79899 Other long term (current) drug therapy: Secondary | ICD-10-CM | POA: Insufficient documentation

## 2020-07-14 DIAGNOSIS — E1169 Type 2 diabetes mellitus with other specified complication: Secondary | ICD-10-CM | POA: Diagnosis not present

## 2020-07-14 DIAGNOSIS — Z170 Estrogen receptor positive status [ER+]: Secondary | ICD-10-CM

## 2020-07-14 DIAGNOSIS — D0592 Unspecified type of carcinoma in situ of left breast: Secondary | ICD-10-CM | POA: Diagnosis not present

## 2020-07-14 DIAGNOSIS — Z7951 Long term (current) use of inhaled steroids: Secondary | ICD-10-CM | POA: Insufficient documentation

## 2020-07-14 DIAGNOSIS — Z79811 Long term (current) use of aromatase inhibitors: Secondary | ICD-10-CM | POA: Insufficient documentation

## 2020-07-14 DIAGNOSIS — E669 Obesity, unspecified: Secondary | ICD-10-CM

## 2020-07-14 DIAGNOSIS — E119 Type 2 diabetes mellitus without complications: Secondary | ICD-10-CM | POA: Insufficient documentation

## 2020-07-14 DIAGNOSIS — Z7984 Long term (current) use of oral hypoglycemic drugs: Secondary | ICD-10-CM | POA: Diagnosis not present

## 2020-07-14 DIAGNOSIS — Z6841 Body Mass Index (BMI) 40.0 and over, adult: Secondary | ICD-10-CM

## 2020-07-14 LAB — CBC WITH DIFFERENTIAL (CANCER CENTER ONLY)
Abs Immature Granulocytes: 0.01 10*3/uL (ref 0.00–0.07)
Basophils Absolute: 0.1 10*3/uL (ref 0.0–0.1)
Basophils Relative: 1 %
Eosinophils Absolute: 0.1 10*3/uL (ref 0.0–0.5)
Eosinophils Relative: 1 %
HCT: 46.7 % — ABNORMAL HIGH (ref 36.0–46.0)
Hemoglobin: 15.1 g/dL — ABNORMAL HIGH (ref 12.0–15.0)
Immature Granulocytes: 0 %
Lymphocytes Relative: 24 %
Lymphs Abs: 1.5 10*3/uL (ref 0.7–4.0)
MCH: 29.7 pg (ref 26.0–34.0)
MCHC: 32.3 g/dL (ref 30.0–36.0)
MCV: 91.7 fL (ref 80.0–100.0)
Monocytes Absolute: 0.4 10*3/uL (ref 0.1–1.0)
Monocytes Relative: 7 %
Neutro Abs: 4.4 10*3/uL (ref 1.7–7.7)
Neutrophils Relative %: 67 %
Platelet Count: 237 10*3/uL (ref 150–400)
RBC: 5.09 MIL/uL (ref 3.87–5.11)
RDW: 14.8 % (ref 11.5–15.5)
WBC Count: 6.5 10*3/uL (ref 4.0–10.5)
nRBC: 0 % (ref 0.0–0.2)

## 2020-07-14 LAB — CMP (CANCER CENTER ONLY)
ALT: 15 U/L (ref 0–44)
AST: 15 U/L (ref 15–41)
Albumin: 3.9 g/dL (ref 3.5–5.0)
Alkaline Phosphatase: 74 U/L (ref 38–126)
Anion gap: 9 (ref 5–15)
BUN: 13 mg/dL (ref 8–23)
CO2: 29 mmol/L (ref 22–32)
Calcium: 9.7 mg/dL (ref 8.9–10.3)
Chloride: 105 mmol/L (ref 98–111)
Creatinine: 0.81 mg/dL (ref 0.44–1.00)
GFR, Estimated: >60 mL/min (ref >60)
Glucose, Bld: 108 mg/dL — ABNORMAL HIGH (ref 70–99)
Potassium: 4.2 mmol/L (ref 3.5–5.1)
Sodium: 143 mmol/L (ref 135–145)
Total Bilirubin: 0.8 mg/dL (ref 0.3–1.2)
Total Protein: 7.4 g/dL (ref 6.5–8.1)

## 2020-07-14 NOTE — Progress Notes (Signed)
[place]  [phone] Fax:([phone]) [phone]     ID: Caitlin Rivera DOB: 1955/12/18  MR#: [id number]  RJJ#:[id number]  Patient Care Team: Benito Mccreedy, MD as PCP - General (Internal Medicine) Lovada Barwick, Virgie Dad, MD as Consulting Physician (Oncology) Erroll Luna, MD as Consulting Physician (General Surgery) Mauro Kaufmann, RN as Oncology Nurse Navigator Rockwell Germany, RN as Oncology Nurse Navigator Kyung Rudd, MD as Consulting Physician (Radiation Oncology) Chauncey Cruel, MD OTHER MD:  CHIEF COMPLAINT: Estrogen receptor positive noninvasive breast cancer  CURRENT TREATMENT: Anastrozole   INTERVAL HISTORY: Caitlin Rivera returns today for follow up of her noninvasive breast cancer.   She started anastrozole on 10/22/2019.  She has some hot flashes which she says are "inside", do not make her sweat, but do make her feel like she is burning up.  They are unfortunately not very common.  There is no bone density screening on file.  Since her last visit, she underwent bilateral diagnostic mammography with tomography at [facility] on 04/24/2020 showing: breast density category B; no evidence of malignancy in either breast.   She presented to the emergency room February 2022 with left shoulder pain.  Chest x-ray and work-up were benign.  She continues to have discomfort in the left shoulder sometimes radiating down the upper left arm   REVIEW OF SYSTEMS: Caitlin Rivera is not exercising regularly.  She does do her housework and sometimes does a little walking.  She has had no unusual headaches, visual changes, nausea vomiting, cough phlegm production or pleurisy.  There has been no change in bowel or bladder habits.  The anniversary of her father's death is this week and she is grieving appropriately.  A detailed review of systems today was otherwise stable   COVID 19 VACCINATION STATUS: Status post Pfizer x2, no booster as of July 2022   HISTORY OF  CURRENT ILLNESS: From the original intake note:  "Caitlin Rivera" had routine screening mammography on 04/23/2019 showing a possible abnormality in the bilateral breasts. She underwent bilateral diagnostic mammography with tomography and bilateral breast ultrasonography at [facility] on 05/01/2019 showing: breast density category B; persistent lower-inner left breast asymmetry with no ultrasound correlate; superficial 0.7 cm right breast mass at 3 o'clock.  Accordingly on 06/03/2019 she proceeded to biopsy of the bilateral breast areas in question. The pathology from this procedure ([id number]) showed:  1. Left breast  - benign breast parenchyma 2. Right breast  - sclerosing papillary lesion   The left breast diagnosis was found to be discordant, and a repeat biopsy was recommended. This was performed on 06/14/2019, with pathology [id number]) showing: ductal carcinoma in situ with calcifications, intermediate grade, involving an underlying complex sclerosing lesion. Prognostic indicators significant for: estrogen receptor, 95% positive and progesterone receptor, 85% positive, both with strong staining intensity.  The patient's subsequent history is as detailed below.   PAST MEDICAL HISTORY: Past Medical History:  Diagnosis Date   Asthma    Cancer ([place]) 06/2019   left breast DCIS   COPD (chronic obstructive pulmonary disease) ([place])    smoker   Diabetes mellitus without complication (HCC)    Fibroids    Hypertension    Personal history of radiation therapy    Smoker   Hypertension hypercholesterolemia type 2 diabetes mellitus morbid obesity and possibly hepatitis.   PAST SURGICAL HISTORY: Past Surgical History:  Procedure Laterality Date   BREAST BIOPSY Bilateral 06/03/2019   BREAST BIOPSY Left 06/14/2019   BREAST EXCISIONAL BIOPSY Right 07/2019  BREAST LUMPECTOMY Left 08/04/19   BREAST LUMPECTOMY WITH RADIOACTIVE SEED LOCALIZATION Bilateral 07/31/2019   Procedure: BILATERAL BREAST  LUMPECTOMY WITH RADIOACTIVE SEED LOCALIZATION;  Surgeon: Jovita Kussmaul, MD;  Location: [facility];  Service: General;  Laterality: Bilateral;   TUBAL LIGATION     TUBAL LIGATION    Possible appendectomy   FAMILY HISTORY: Family History  Problem Relation Age of Onset   Cancer Mother    The patient's father died at age 51 1 08-04-2019.  He had 11 siblings, 1 of the patient's paternal aunts had cancer of unknown type.  The patient's mother died from "stomach "cancer at the age of 45.  One maternal uncle had throat cancer.  The patient herself has a total of 8 siblings, none with cancer   GYNECOLOGIC HISTORY:  No LMP recorded. Patient is postmenopausal. Menarche: 65 years old Age at first live birth: 65 years old [place] P  3 LMP 27 Contraceptive yes, several years, without complications HRT no Hysterectomy?  No BSO?  No no   SOCIAL HISTORY: (updated 06/2019)  Caitlin Rivera used to work in [employer], but is now disabled.  She moved to this area from [place] to be closer to her oldest daughter Varney Biles.  Patient is a homemaker.  Daughter Donald Prose works in a Materials engineer in [place].  Daughter Donella Stade also lives in [place] but is disabled.  The patient has 9 grandchildren.  She has no great-grandchildren.  She attends a [employer].   ADVANCED DIRECTIVES: At the 07/14/2020 visit the patient was given the appropriate documents to complete and notarize at her discretion.   HEALTH MAINTENANCE: Social History   Tobacco Use   Smoking status: Every Day    Packs/day: 0.50    Pack years: 0.00    Types: Cigarettes   Smokeless tobacco: Never  Substance Use Topics   Alcohol use: No   Drug use: Yes    Types: "Crack" cocaine    Comment: crack-last smoked 2 wks ago-07-24-19, states she uses once a month     Colonoscopy: Never  PAP: Up-to-date  Bone density: never   Allergies  Allergen Reactions   Morphine And Related Itching   Ibuprofen Itching    Current Outpatient Medications   Medication Sig Dispense Refill   acetaminophen (TYLENOL 8 HOUR) 650 MG CR tablet Take 1 tablet (650 mg total) by mouth every 8 (eight) hours as needed for pain or fever. 30 tablet 0   albuterol (VENTOLIN HFA) 108 (90 Base) MCG/ACT inhaler      amLODipine (NORVASC) 5 MG tablet Take 5 mg by mouth daily.     amoxicillin (AMOXIL) 500 MG tablet Take 500 mg by mouth 3 (three) times daily.     anastrozole (ARIMIDEX) 1 MG tablet Take 1 tablet (1 mg total) by mouth daily. 90 tablet 4   gabapentin (NEURONTIN) 400 MG capsule Take 400 mg by mouth 3 (three) times daily.     Lidocaine 4 % PTCH Apply 1 patch topically 2 (two) times daily. 12 patch 0   metFORMIN (GLUCOPHAGE) 500 MG tablet Take 500 mg by mouth 2 (two) times daily.     OVER THE COUNTER MEDICATION Take 1-2 tablets by mouth 2 (two) times daily as needed (for pain). OTC Pain Reliever PM     rosuvastatin (CRESTOR) 20 MG tablet Take 20 mg by mouth daily.     SYMBICORT 160-4.5 MCG/ACT inhaler      tiZANidine (ZANAFLEX) 4 MG tablet Take 4 mg by mouth  every 8 (eight) hours as needed.     No current facility-administered medications for this visit.    OBJECTIVE: African-American woman who appears older than stated age  85:   07/14/20 1444  BP: (!) 111/55  Pulse: 62  Resp: 18  Temp: (!) 96.3 F (35.7 C)  SpO2: 95%     Body mass index is 39.98 kg/m.   Wt Readings from Last 3 Encounters:  07/14/20 255 lb 4 oz (115.8 kg)  01/13/20 264 lb (119.7 kg)  09/10/19 275 lb 11.2 oz (125.1 kg)     ECOG FS:2 - Symptomatic, <50% confined to bed  Sclerae unicteric, EOMs intact Wearing a mask No cervical or supraclavicular adenopathy Lungs no rales or rhonchi Heart regular rate and rhythm Abd soft, obese, nontender, positive bowel sounds MSK no focal spinal tenderness, no upper extremity lymphedema Neuro: nonfocal, well oriented, appropriate affect Breasts: The right breast is larger than the left.  There are no masses or skin changes of  concern.  The left breast status postlumpectomy and radiation.  There is mild hyperpigmentation and mild coarsening of the skin.  There is some scar tissue inferiorly but there is no evidence of local recurrence.  Both axillae are benign.   LAB RESULTS:  CMP     Component Value Date/Time   NA 143 07/14/2020 1428   NA 141 01/03/2012 1730   K 4.2 07/14/2020 1428   K 3.0 (L) 01/03/2012 1730   CL 105 07/14/2020 1428   CL 104 01/03/2012 1730   CO2 29 07/14/2020 1428   CO2 31 01/03/2012 1730   GLUCOSE 108 (H) 07/14/2020 1428   GLUCOSE 92 01/03/2012 1730   BUN 13 07/14/2020 1428   BUN 12 01/03/2012 1730   CREATININE 0.81 07/14/2020 1428   CREATININE 0.94 01/03/2012 1730   CALCIUM 9.7 07/14/2020 1428   CALCIUM 8.4 (L) 01/03/2012 1730   PROT 7.4 07/14/2020 1428   PROT 7.7 01/03/2012 1730   ALBUMIN 3.9 07/14/2020 1428   ALBUMIN 3.4 01/03/2012 1730   AST 15 07/14/2020 1428   ALT 15 07/14/2020 1428   ALT 32 01/03/2012 1730   ALKPHOS 74 07/14/2020 1428   ALKPHOS 113 01/03/2012 1730   BILITOT 0.8 07/14/2020 1428   GFRNONAA >60 07/14/2020 1428   GFRNONAA >60 01/03/2012 1730   GFRAA >60 07/29/2019 0903   GFRAA >60 07/02/2019 1457   GFRAA >60 01/03/2012 1730    No results found for: TOTALPROTELP, ALBUMINELP, A1GS, A2GS, BETS, BETA2SER, GAMS, MSPIKE, SPEI  Lab Results  Component Value Date   WBC 6.5 07/14/2020   NEUTROABS 4.4 07/14/2020   HGB 15.1 (H) 07/14/2020   HCT 46.7 (H) 07/14/2020   MCV 91.7 07/14/2020   PLT 237 07/14/2020    No results found for: LABCA2  No components found for: QQVZDG387  No results for input(s): INR in the last 168 hours.  No results found for: LABCA2  No results found for: FIE332  No results found for: RJJ884  No results found for: ZYS063  No results found for: CA2729  No components found for: HGQUANT  No results found for: CEA1 / No results found for: CEA1   No results found for: AFPTUMOR  No results found for: CHROMOGRNA  No  results found for: KPAFRELGTCHN, LAMBDASER, KAPLAMBRATIO (kappa/lambda light chains)  No results found for: HGBA, HGBA2QUANT, HGBFQUANT, HGBSQUAN (Hemoglobinopathy evaluation)   No results found for: LDH  No results found for: IRON, TIBC, IRONPCTSAT (Iron and TIBC)  No results found for:  FERRITIN  Urinalysis    Component Value Date/Time   COLORURINE YELLOW 08/21/2013 1847   APPEARANCEUR CLOUDY (A) 08/21/2013 1847   LABSPEC 1.027 08/21/2013 1847   PHURINE 5.5 08/21/2013 1847   GLUCOSEU NEGATIVE 08/21/2013 1847   HGBUR NEGATIVE 08/21/2013 1847   BILIRUBINUR NEGATIVE 08/21/2013 1847   KETONESUR 15 (A) 08/21/2013 1847   PROTEINUR NEGATIVE 08/21/2013 1847   UROBILINOGEN 1.0 08/21/2013 1847   NITRITE NEGATIVE 08/21/2013 1847   LEUKOCYTESUR SMALL (A) 08/21/2013 1847    STUDIES: No results found.   ELIGIBLE FOR AVAILABLE RESEARCH PROTOCOL: AET  ASSESSMENT: 65 y.o. [place] woman status post bilateral breast biopsies 06/03/2019 showing  (a) in the left breast, benign tissue, felt to be discordant    (b) in the right breast, a complex sclerosing lesion  (c) repeat left breast biopsy 06/14/2019 shows ductal carcinoma in situ, grade 2, estrogen and progesterone receptor positive  (1) status post bilateral lumpectomies 07/31/2019 showing  (a) in the right breast, an intraductal papilloma  (b) in the left breast, no residual carcinoma.  (2) adjuvant radiation from 09/02/2019-09/30/2019.  The patient initially received a dose of 42.56 Gy in 16 fractions to the breast using whole-breast tangent fields. This was delivered using a 3-D conformal technique. The patient then received a boost to the seroma. This delivered an additional 8 Gy in 42fractions using a 3 field photon technique due to the depth of the seroma. The total dose was 50.56 Gy.  (3) Anastrozole daily beginning 10/22/2019   PLAN: Caitlin Rivera is now a little over a year from definitive surgery for her noninvasive breast cancer.   There is no evidence of local recurrence.  That is favorable.  She is tolerating anastrozole generally well and the plan is to continue that a total of 5 years.  She will benefit from bone density monitoring and I have's added a DEXA scan to be done at the same time as her mammogram next April.  Today I gave her a copy of our healthcare power of attorney form and instructions on how to complete it and have it notarized.  She intends to name her daughter who lives here locally as her healthcare power of attorney  Otherwise she will return to see [place] in a year.  She knows to call for any other issue that may develop before that visit  Total encounter time 25 minutes.Sarajane Jews C. Fleurette Woolbright, MD 07/14/20 3:18 PM Medical Oncology and Hematology [facility] [place], [place] [address] Tel. [phone]    Fax. [phone]   I, Wilburn Mylar, am acting as scribe for Dr. Virgie Dad. Griselda Tosh.  I, Lurline Del MD, have reviewed the above documentation for accuracy and completeness, and I agree with the above.   *Total Encounter Time as defined by the Centers for Medicare and Medicaid Services includes, in addition to the face-to-face time of a patient visit (documented in the note above) non-face-to-face time: obtaining and reviewing outside history, ordering and reviewing medications, tests or procedures, care coordination (communications with other health care professionals or caregivers) and documentation in the medical record.

## 2020-07-16 ENCOUNTER — Telehealth: Payer: Self-pay | Admitting: Oncology

## 2020-07-16 NOTE — Telephone Encounter (Signed)
Sch per 7/6  los, pt aware

## 2020-07-29 ENCOUNTER — Encounter (HOSPITAL_COMMUNITY): Payer: Self-pay

## 2020-12-29 ENCOUNTER — Ambulatory Visit
Admission: RE | Admit: 2020-12-29 | Discharge: 2020-12-29 | Disposition: A | Discharge status: Home / Self Care | Payer: Medicaid Other | Source: Ambulatory Visit | Attending: Oncology | Admitting: Oncology

## 2020-12-29 DIAGNOSIS — Z6841 Body Mass Index (BMI) 40.0 and over, adult: Secondary | ICD-10-CM

## 2020-12-29 DIAGNOSIS — E1169 Type 2 diabetes mellitus with other specified complication: Secondary | ICD-10-CM

## 2020-12-29 DIAGNOSIS — C50312 Malignant neoplasm of lower-inner quadrant of left female breast: Secondary | ICD-10-CM

## 2021-06-28 ENCOUNTER — Other Ambulatory Visit: Payer: Self-pay | Admitting: *Deleted

## 2021-06-28 DIAGNOSIS — Z170 Estrogen receptor positive status [ER+]: Secondary | ICD-10-CM

## 2021-06-29 ENCOUNTER — Inpatient Hospital Stay: Payer: Medicaid Other | Admitting: Hematology and Oncology

## 2021-06-29 ENCOUNTER — Inpatient Hospital Stay: Payer: Medicaid Other | Attending: Internal Medicine

## 2021-08-27 ENCOUNTER — Encounter: Payer: Self-pay | Admitting: Hematology and Oncology

## 2021-08-27 ENCOUNTER — Inpatient Hospital Stay: Payer: Medicaid Other | Attending: Internal Medicine

## 2021-08-27 ENCOUNTER — Other Ambulatory Visit: Payer: Self-pay

## 2021-08-27 ENCOUNTER — Inpatient Hospital Stay (HOSPITAL_BASED_OUTPATIENT_CLINIC_OR_DEPARTMENT_OTHER): Payer: Medicaid Other | Admitting: Hematology and Oncology

## 2021-08-27 VITALS — BP 140/70 | HR 88 | Temp 98.1°F | Resp 16 | Ht 67.0 in | Wt 228.9 lb

## 2021-08-27 DIAGNOSIS — Z170 Estrogen receptor positive status [ER+]: Secondary | ICD-10-CM | POA: Diagnosis not present

## 2021-08-27 DIAGNOSIS — Z79811 Long term (current) use of aromatase inhibitors: Secondary | ICD-10-CM | POA: Insufficient documentation

## 2021-08-27 DIAGNOSIS — C50312 Malignant neoplasm of lower-inner quadrant of left female breast: Secondary | ICD-10-CM | POA: Diagnosis present

## 2021-08-27 DIAGNOSIS — Z923 Personal history of irradiation: Secondary | ICD-10-CM | POA: Insufficient documentation

## 2021-08-27 DIAGNOSIS — Z79899 Other long term (current) drug therapy: Secondary | ICD-10-CM | POA: Insufficient documentation

## 2021-08-27 DIAGNOSIS — F17210 Nicotine dependence, cigarettes, uncomplicated: Secondary | ICD-10-CM | POA: Insufficient documentation

## 2021-08-27 LAB — CBC WITH DIFFERENTIAL (CANCER CENTER ONLY)
Abs Immature Granulocytes: 0.02 10*3/uL (ref 0.00–0.07)
Basophils Absolute: 0.1 10*3/uL (ref 0.0–0.1)
Basophils Relative: 1 %
Eosinophils Absolute: 0.1 10*3/uL (ref 0.0–0.5)
Eosinophils Relative: 1 %
HCT: 49.7 % — ABNORMAL HIGH (ref 36.0–46.0)
Hemoglobin: 16.5 g/dL — ABNORMAL HIGH (ref 12.0–15.0)
Immature Granulocytes: 0 %
Lymphocytes Relative: 21 %
Lymphs Abs: 1.5 10*3/uL (ref 0.7–4.0)
MCH: 30.1 pg (ref 26.0–34.0)
MCHC: 33.2 g/dL (ref 30.0–36.0)
MCV: 90.5 fL (ref 80.0–100.0)
Monocytes Absolute: 0.4 10*3/uL (ref 0.1–1.0)
Monocytes Relative: 5 %
Neutro Abs: 5.1 10*3/uL (ref 1.7–7.7)
Neutrophils Relative %: 72 %
Platelet Count: 228 10*3/uL (ref 150–400)
RBC: 5.49 MIL/uL — ABNORMAL HIGH (ref 3.87–5.11)
RDW: 14.2 % (ref 11.5–15.5)
WBC Count: 7.2 10*3/uL (ref 4.0–10.5)
nRBC: 0 % (ref 0.0–0.2)

## 2021-08-27 LAB — CMP (CANCER CENTER ONLY)
ALT: 13 U/L (ref 0–44)
AST: 11 U/L — ABNORMAL LOW (ref 15–41)
Albumin: 4.3 g/dL (ref 3.5–5.0)
Alkaline Phosphatase: 90 U/L (ref 38–126)
Anion gap: 4 — ABNORMAL LOW (ref 5–15)
BUN: 20 mg/dL (ref 8–23)
CO2: 32 mmol/L (ref 22–32)
Calcium: 9.9 mg/dL (ref 8.9–10.3)
Chloride: 103 mmol/L (ref 98–111)
Creatinine: 0.82 mg/dL (ref 0.44–1.00)
GFR, Estimated: >60 mL/min (ref >60)
Glucose, Bld: 191 mg/dL — ABNORMAL HIGH (ref 70–99)
Potassium: 4 mmol/L (ref 3.5–5.1)
Sodium: 139 mmol/L (ref 135–145)
Total Bilirubin: 0.8 mg/dL (ref 0.3–1.2)
Total Protein: 7.2 g/dL (ref 6.5–8.1)

## 2021-08-27 NOTE — Progress Notes (Signed)
[place]  Telephone:([phone]) [phone] Fax:(336) [phone]     ID: Caitlin Rivera DOB: 1955-05-09  MR#: [id number]  ZLD#:[id number]  Patient Care Team: Benito Mccreedy, MD as PCP - General (Internal Medicine) Magrinat, Virgie Dad, MD (Inactive) as Consulting Physician (Oncology) Erroll Luna, MD as Consulting Physician (General Surgery) Mauro Kaufmann, RN as Oncology Nurse Navigator Rockwell Germany, RN as Oncology Nurse Navigator Kyung Rudd, MD as Consulting Physician (Radiation Oncology) Benay Pike, MD   CHIEF COMPLAINT: Estrogen receptor positive noninvasive breast cancer  CURRENT TREATMENT: Anastrozole   INTERVAL HISTORY: Caitlin Rivera returns today for follow up of her noninvasive breast cancer.  She is currently on anastrozole.  She has been taking it every day as recommended.  She denies any new side effects. She has not had a mammogram in 2023, she cannot remember the last time she had mammogram.  She tells me she is very sleepy today.  She has not reported any breast changes today.  She has not had any interim hospitalizations or infections. Rest of the pertinent 10 point ROS reviewed and negative   COVID 19 VACCINATION STATUS: Status post [place] x2, no booster as of August 16, 2020   HISTORY OF CURRENT ILLNESS: From the original intake note:  "Caitlin Rivera" had routine screening mammography on 04/23/2019 showing a possible abnormality in the bilateral breasts. She underwent bilateral diagnostic mammography with tomography and bilateral breast ultrasonography at [facility] on 05/01/2019 showing: breast density category B; persistent lower-inner left breast asymmetry with no ultrasound correlate; superficial 0.7 cm right breast mass at 3 o'clock.  Accordingly on 06/03/2019 she proceeded to biopsy of the bilateral breast areas in question. The pathology from this procedure ([id number]) showed:  1. Left breast  - benign breast parenchyma 2. Right breast  -  sclerosing papillary lesion   The left breast diagnosis was found to be discordant, and a repeat biopsy was recommended. This was performed on 06/14/2019, with pathology [id number]) showing: ductal carcinoma in situ with calcifications, intermediate grade, involving an underlying complex sclerosing lesion. Prognostic indicators significant for: estrogen receptor, 95% positive and progesterone receptor, 85% positive, both with strong staining intensity.  The patient's subsequent history is as detailed below.   PAST MEDICAL HISTORY: Past Medical History:  Diagnosis Date   Asthma    Cancer ([place]) 06/2019   left breast DCIS   COPD (chronic obstructive pulmonary disease) ([place])    smoker   Diabetes mellitus without complication (HCC)    Fibroids    Hypertension    Personal history of radiation therapy    Smoker   Hypertension hypercholesterolemia type 2 diabetes mellitus morbid obesity and possibly hepatitis.   PAST SURGICAL HISTORY: Past Surgical History:  Procedure Laterality Date   BREAST BIOPSY Bilateral 06/03/2019   BREAST BIOPSY Left 06/14/2019   BREAST EXCISIONAL BIOPSY Right August 17, 2019   BREAST LUMPECTOMY Left Aug 17, 2019   BREAST LUMPECTOMY WITH RADIOACTIVE SEED LOCALIZATION Bilateral 07/31/2019   Procedure: BILATERAL BREAST LUMPECTOMY WITH RADIOACTIVE SEED LOCALIZATION;  Surgeon: Jovita Kussmaul, MD;  Location: [facility];  Service: General;  Laterality: Bilateral;   TUBAL LIGATION     TUBAL LIGATION    Possible appendectomy   FAMILY HISTORY: Family History  Problem Relation Age of Onset   Cancer Mother    The patient's father died at age 45 1 Aug 17, 2019.  He had 11 siblings, 1 of the patient's paternal aunts had cancer of unknown type.  The patient's mother died from "stomach "cancer at the  age of 72.  One maternal uncle had throat cancer.  The patient herself has a total of 8 siblings, none with cancer   GYNECOLOGIC HISTORY:  No LMP recorded. Patient is  postmenopausal. Menarche: 66 years old Age at first live birth: 66 years old [place] P  3 LMP 66 Contraceptive yes, several years, without complications HRT no Hysterectomy?  No BSO?  No no   SOCIAL HISTORY: (updated 06/2019)  Caitlin Rivera used to work in [employer], but is now disabled.  She moved to this area from [place] to be closer to her oldest daughter Varney Biles.  Patient is a homemaker.  Daughter Donald Prose works in a Materials engineer in [place].  Daughter Donella Stade also lives in [place] but is disabled.  The patient has 9 grandchildren.  She has no great-grandchildren.  She attends a [employer].   ADVANCED DIRECTIVES: At the 07/14/2020 visit the patient was given the appropriate documents to complete and notarize at her discretion.   HEALTH MAINTENANCE: Social History   Tobacco Use   Smoking status: Every Day    Packs/day: 0.50    Types: Cigarettes   Smokeless tobacco: Never  Substance Use Topics   Alcohol use: No   Drug use: Yes    Types: "Crack" cocaine    Comment: crack-last smoked 2 wks ago-07-24-19, states she uses once a month     Colonoscopy: Never  PAP: Up-to-date  Bone density: never   Allergies  Allergen Reactions   Morphine And Related Itching   Ibuprofen Itching    Current Outpatient Medications  Medication Sig Dispense Refill   acetaminophen (TYLENOL 8 HOUR) 650 MG CR tablet Take 1 tablet (650 mg total) by mouth every 8 (eight) hours as needed for pain or fever. 30 tablet 0   albuterol (VENTOLIN HFA) 108 (90 Base) MCG/ACT inhaler      amLODipine (NORVASC) 5 MG tablet Take 5 mg by mouth daily.     amoxicillin (AMOXIL) 500 MG tablet Take 500 mg by mouth 3 (three) times daily.     anastrozole (ARIMIDEX) 1 MG tablet Take 1 tablet (1 mg total) by mouth daily. 90 tablet 4   gabapentin (NEURONTIN) 400 MG capsule Take 400 mg by mouth 3 (three) times daily.     Lidocaine 4 % PTCH Apply 1 patch topically 2 (two) times daily. 12 patch 0   metFORMIN (GLUCOPHAGE) 500 MG tablet Take  500 mg by mouth 2 (two) times daily.     OVER THE COUNTER MEDICATION Take 1-2 tablets by mouth 2 (two) times daily as needed (for pain). OTC Pain Reliever PM     rosuvastatin (CRESTOR) 20 MG tablet Take 20 mg by mouth daily.     SYMBICORT 160-4.5 MCG/ACT inhaler      tiZANidine (ZANAFLEX) 4 MG tablet Take 4 mg by mouth every 8 (eight) hours as needed.     No current facility-administered medications for this visit.    OBJECTIVE: African-American woman who appears older than stated age  There were no vitals filed for this visit.    There is no height or weight on file to calculate BMI.   Wt Readings from Last 3 Encounters:  07/14/20 255 lb 4 oz (115.8 kg)  01/13/20 264 lb (119.7 kg)  09/10/19 275 lb 11.2 oz (125.1 kg)     ECOG FS:2 - Symptomatic, <50% confined to bed  Physical Exam Constitutional:      Appearance: Normal appearance.  Chest:     Comments: Bilateral breasts inspected.  No  palpable masses or regional adenopathy. Musculoskeletal:     Cervical back: Normal range of motion and neck supple. No rigidity.  Lymphadenopathy:     Cervical: No cervical adenopathy.  Neurological:     Mental Status: She is alert.       LAB RESULTS:  CMP     Component Value Date/Time   NA 143 07/14/2020 1428   NA 141 01/03/2012 1730   K 4.2 07/14/2020 1428   K 3.0 (L) 01/03/2012 1730   CL 105 07/14/2020 1428   CL 104 01/03/2012 1730   CO2 29 07/14/2020 1428   CO2 31 01/03/2012 1730   GLUCOSE 108 (H) 07/14/2020 1428   GLUCOSE 92 01/03/2012 1730   BUN 13 07/14/2020 1428   BUN 12 01/03/2012 1730   CREATININE 0.81 07/14/2020 1428   CREATININE 0.94 01/03/2012 1730   CALCIUM 9.7 07/14/2020 1428   CALCIUM 8.4 (L) 01/03/2012 1730   PROT 7.4 07/14/2020 1428   PROT 7.7 01/03/2012 1730   ALBUMIN 3.9 07/14/2020 1428   ALBUMIN 3.4 01/03/2012 1730   AST 15 07/14/2020 1428   ALT 15 07/14/2020 1428   ALT 32 01/03/2012 1730   ALKPHOS 74 07/14/2020 1428   ALKPHOS 113 01/03/2012 1730    BILITOT 0.8 07/14/2020 1428   GFRNONAA >60 07/14/2020 1428   GFRNONAA >60 01/03/2012 1730   GFRAA >60 07/29/2019 0903   GFRAA >60 07/02/2019 1457   GFRAA >60 01/03/2012 1730    No results found for: "TOTALPROTELP", "ALBUMINELP", "A1GS", "A2GS", "BETS", "BETA2SER", "GAMS", "MSPIKE", "SPEI"  Lab Results  Component Value Date   WBC 6.5 07/14/2020   NEUTROABS 4.4 07/14/2020   HGB 15.1 (H) 07/14/2020   HCT 46.7 (H) 07/14/2020   MCV 91.7 07/14/2020   PLT 237 07/14/2020    No results found for: "LABCA2"  No components found for: "FAOZHY865"  No results for input(s): "INR" in the last 168 hours.  No results found for: "LABCA2"  No results found for: "HQI696"  No results found for: "CAN125"  No results found for: "CAN153"  No results found for: "CA2729"  No components found for: "HGQUANT"  No results found for: "CEA1", "CEA" / No results found for: "CEA1", "CEA"   No results found for: "AFPTUMOR"  No results found for: "CHROMOGRNA"  No results found for: "KPAFRELGTCHN", "LAMBDASER", "KAPLAMBRATIO" (kappa/lambda light chains)  No results found for: "HGBA", "HGBA2QUANT", "HGBFQUANT", "HGBSQUAN" (Hemoglobinopathy evaluation)   No results found for: "LDH"  No results found for: "IRON", "TIBC", "IRONPCTSAT" (Iron and TIBC)  No results found for: "FERRITIN"  Urinalysis    Component Value Date/Time   COLORURINE YELLOW 08/21/2013 1847   APPEARANCEUR CLOUDY (A) 08/21/2013 1847   LABSPEC 1.027 08/21/2013 1847   PHURINE 5.5 08/21/2013 1847   GLUCOSEU NEGATIVE 08/21/2013 1847   HGBUR NEGATIVE 08/21/2013 1847   BILIRUBINUR NEGATIVE 08/21/2013 1847   KETONESUR 15 (A) 08/21/2013 1847   PROTEINUR NEGATIVE 08/21/2013 1847   UROBILINOGEN 1.0 08/21/2013 1847   NITRITE NEGATIVE 08/21/2013 1847   LEUKOCYTESUR SMALL (A) 08/21/2013 1847    STUDIES: No results found.   ELIGIBLE FOR AVAILABLE RESEARCH PROTOCOL: AET  ASSESSMENT: 65 y.o. [place] woman status post  bilateral breast biopsies 06/03/2019 showing  (a) in the left breast, benign tissue, felt to be discordant    (b) in the right breast, a complex sclerosing lesion  (c) repeat left breast biopsy 06/14/2019 shows ductal carcinoma in situ, grade 2, estrogen and progesterone receptor positive  (1) status post bilateral lumpectomies 07/31/2019  showing  (a) in the right breast, an intraductal papilloma  (b) in the left breast, no residual carcinoma.      (3) Anastrozole daily beginning 10/22/2019   PLAN: Patient is tolerating anastrozole well.  Physical examination today without any concerns for recurrence Last mammogram April 2022 with no evidence of malignancy. Bone density done in December 2022 with T score of -1.4, osteopenia noted I have ordered a repeat mammogram, she is overdue.  She was encouraged to proceed with mammogram as recommended and return to clinic in 1 year or sooner as needed.  Self breast exam recommended monthly.   Encouraged walking at least 30 minutes a day.  Total time spent: 30 minutes including history, physical exam, review of records, counseling and coordination of  *Total Encounter Time as defined by the Centers for Medicare and Medicaid Services includes, in addition to the face-to-face time of a patient visit (documented in the note above) non-face-to-face time: obtaining and reviewing outside history, ordering and reviewing medications, tests or procedures, care coordination (communications with other health care professionals or caregivers) and documentation in the medical record.

## 2022-02-13 IMAGING — MG MM BREAST LOCALIZATION CLIP
4 series · 4 of 12 positions shown · non-contrast
Comparison: Previous exam(s).

CLINICAL DATA: Confirmation of clip placement after stereotactic
tomosynthesis core needle biopsy of a focal asymmetry in the LOWER
INNER QUADRANT of the LEFT breast.

EXAM:
2D AND TOMOSYNTHESIS DIAGNOSTIC LEFT MAMMOGRAM POST STEREOTACTIC
BIOPSY

[L ML synth-2D]
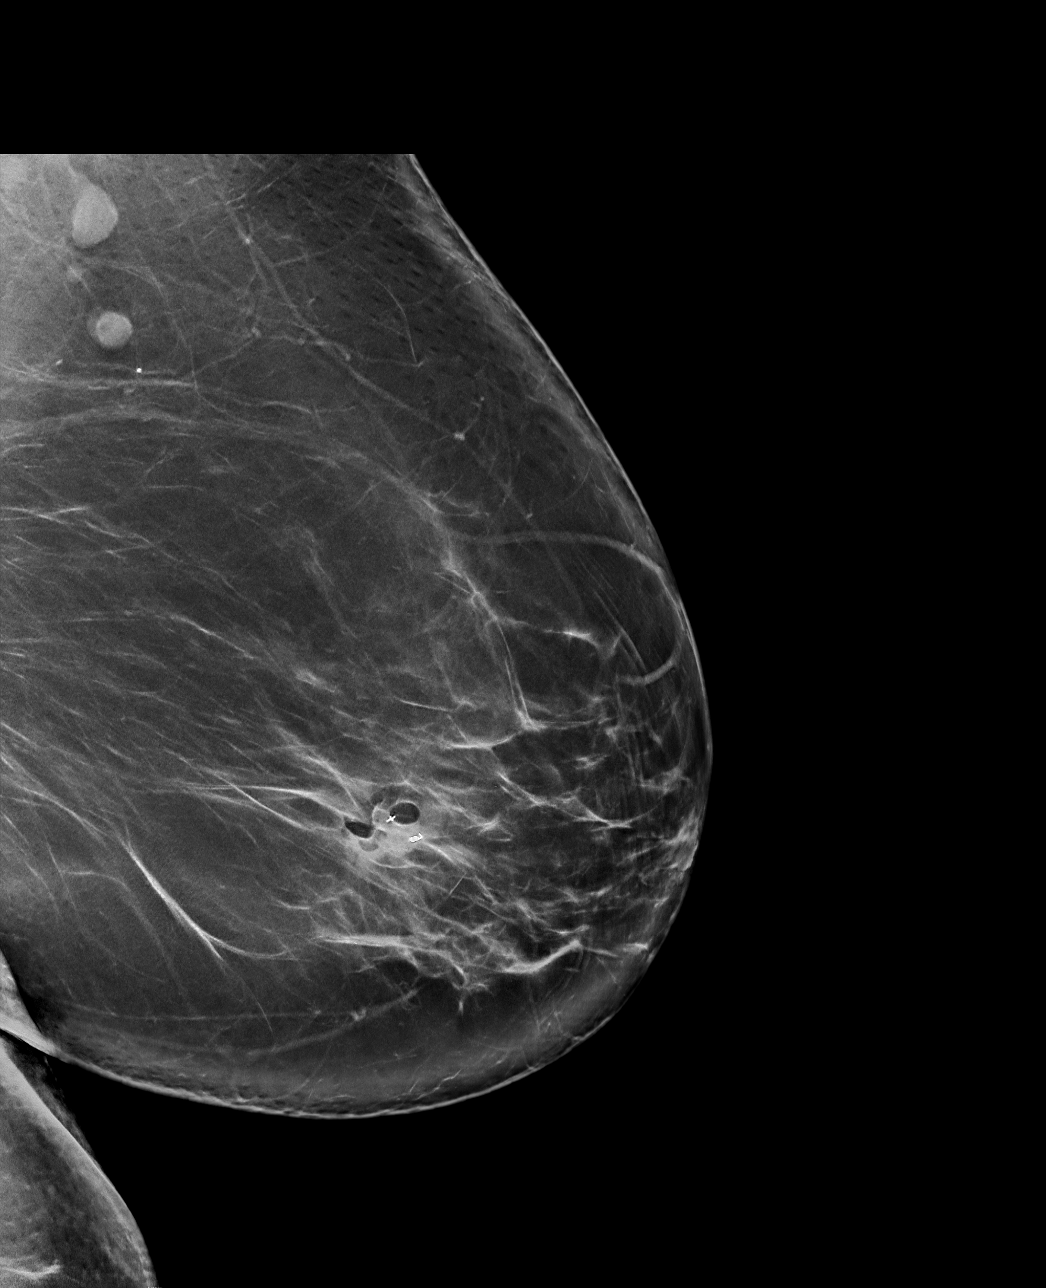

[L CC synth-2D]
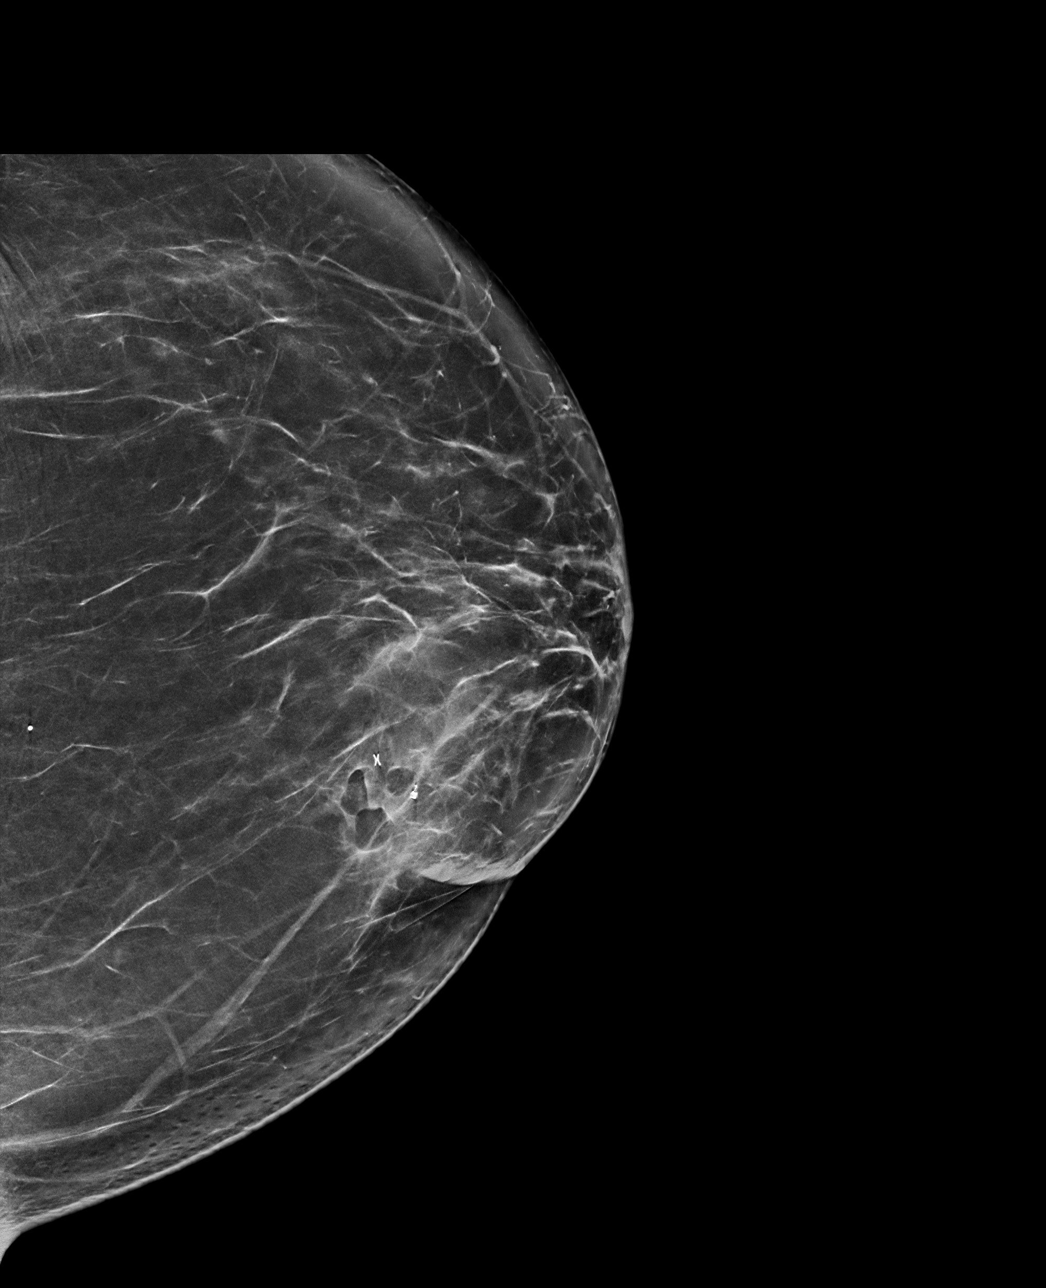

[L ML tomo · tomo slice 52/103.0]
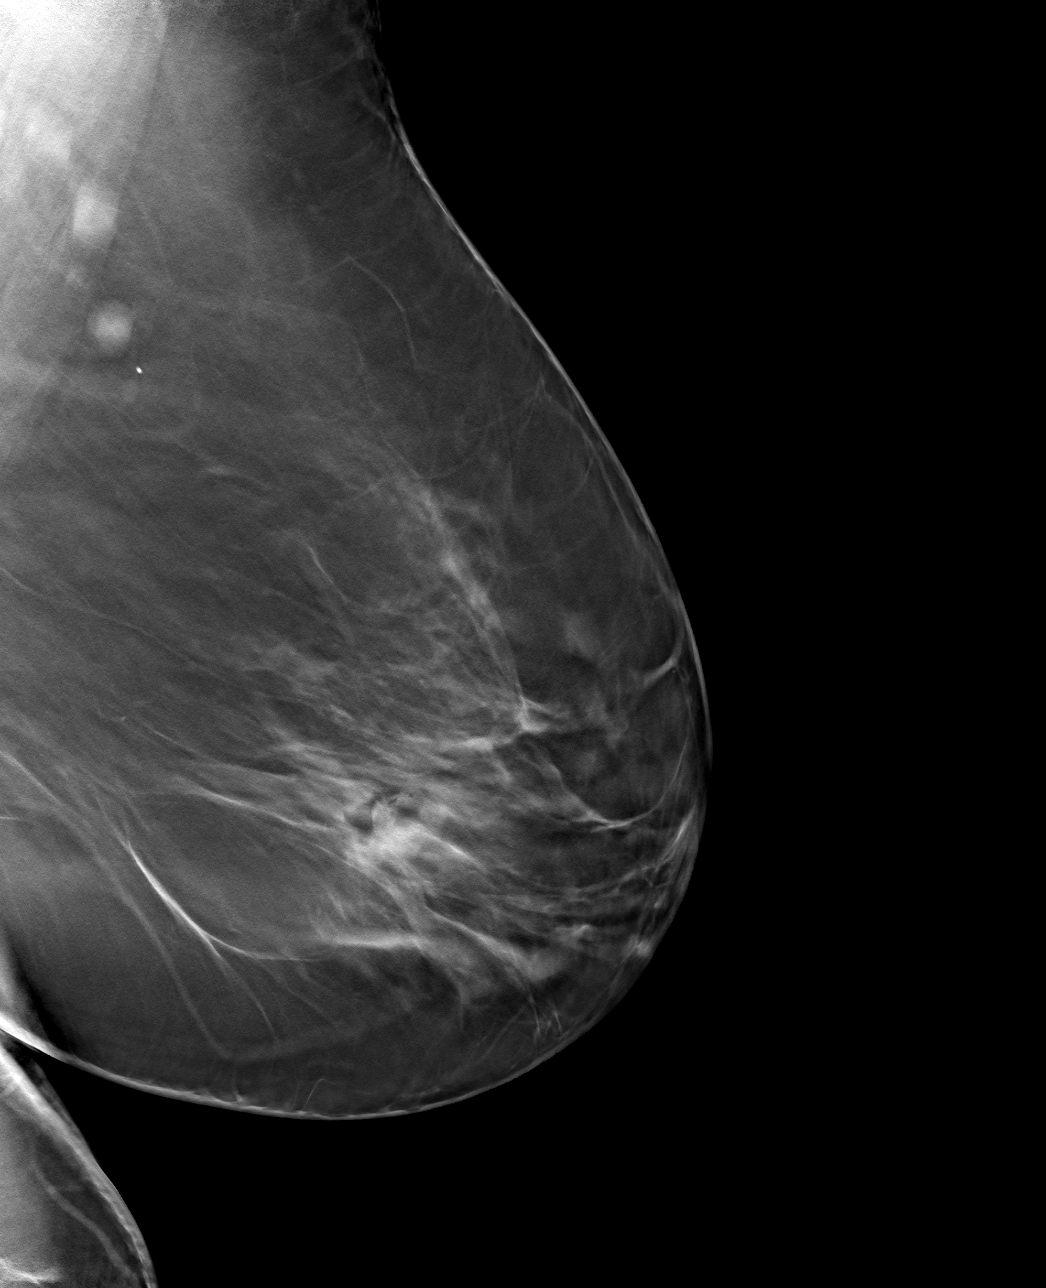

[L CC tomo · tomo slice 42/83.0]
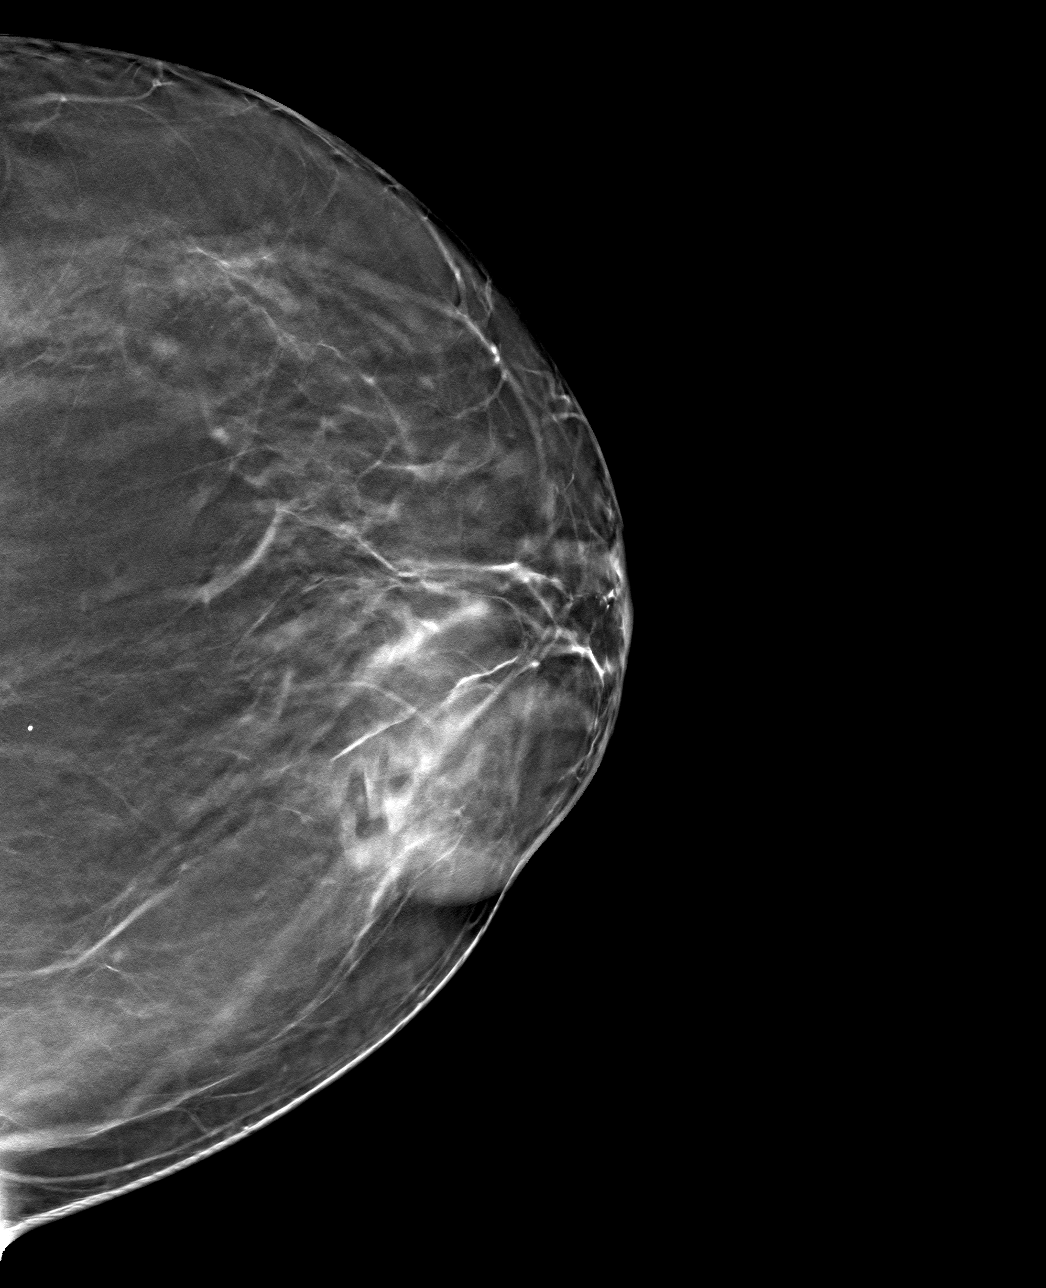

[4 of 12 positions shown; findings below may reference images not displayed]

FINDINGS: Tomosynthesis and synthesized full field CC and mediolateral images
were obtained following stereotactic tomosynthesis guided biopsy of
a focal asymmetry in the LOWER INNER LEFT breast. The X shaped
tissue marking clip is appropriately positioned at the site of the
biopsied focal asymmetry. Gas bubbles related to the biopsy are
present at the site of the focal asymmetry. The X shaped clip is
approximately 8 mm POSTERIOR to the previously placed coil shaped
clip.

Expected post biopsy changes are present without evidence of
hematoma.
IMPRESSION: 1. Appropriate positioning of the X shaped biopsy marking clip at
the site of the biopsied focal asymmetry in the LOWER INNER LEFT
breast.
2. The X shaped clip is approximately 8 mm POSTERIOR to the
previously placed coil shaped clip.

Final Assessment: Post Procedure Mammograms for Marker Placement

## 2022-04-01 IMAGING — MG MM BREAST SURGICAL SPECIMEN
1 series · 2 of 2 positions shown · non-contrast
Comparison: Previous exam(s).

CLINICAL DATA: Surgical excision of a recently biopsied right
breast sclerosing papillary lesion.

EXAM:
SPECIMEN RADIOGRAPH OF THE RIGHT BREAST

[Series 2: L · left · 0.07mm/px · 2 of 2 slices shown]
[im 1/2]
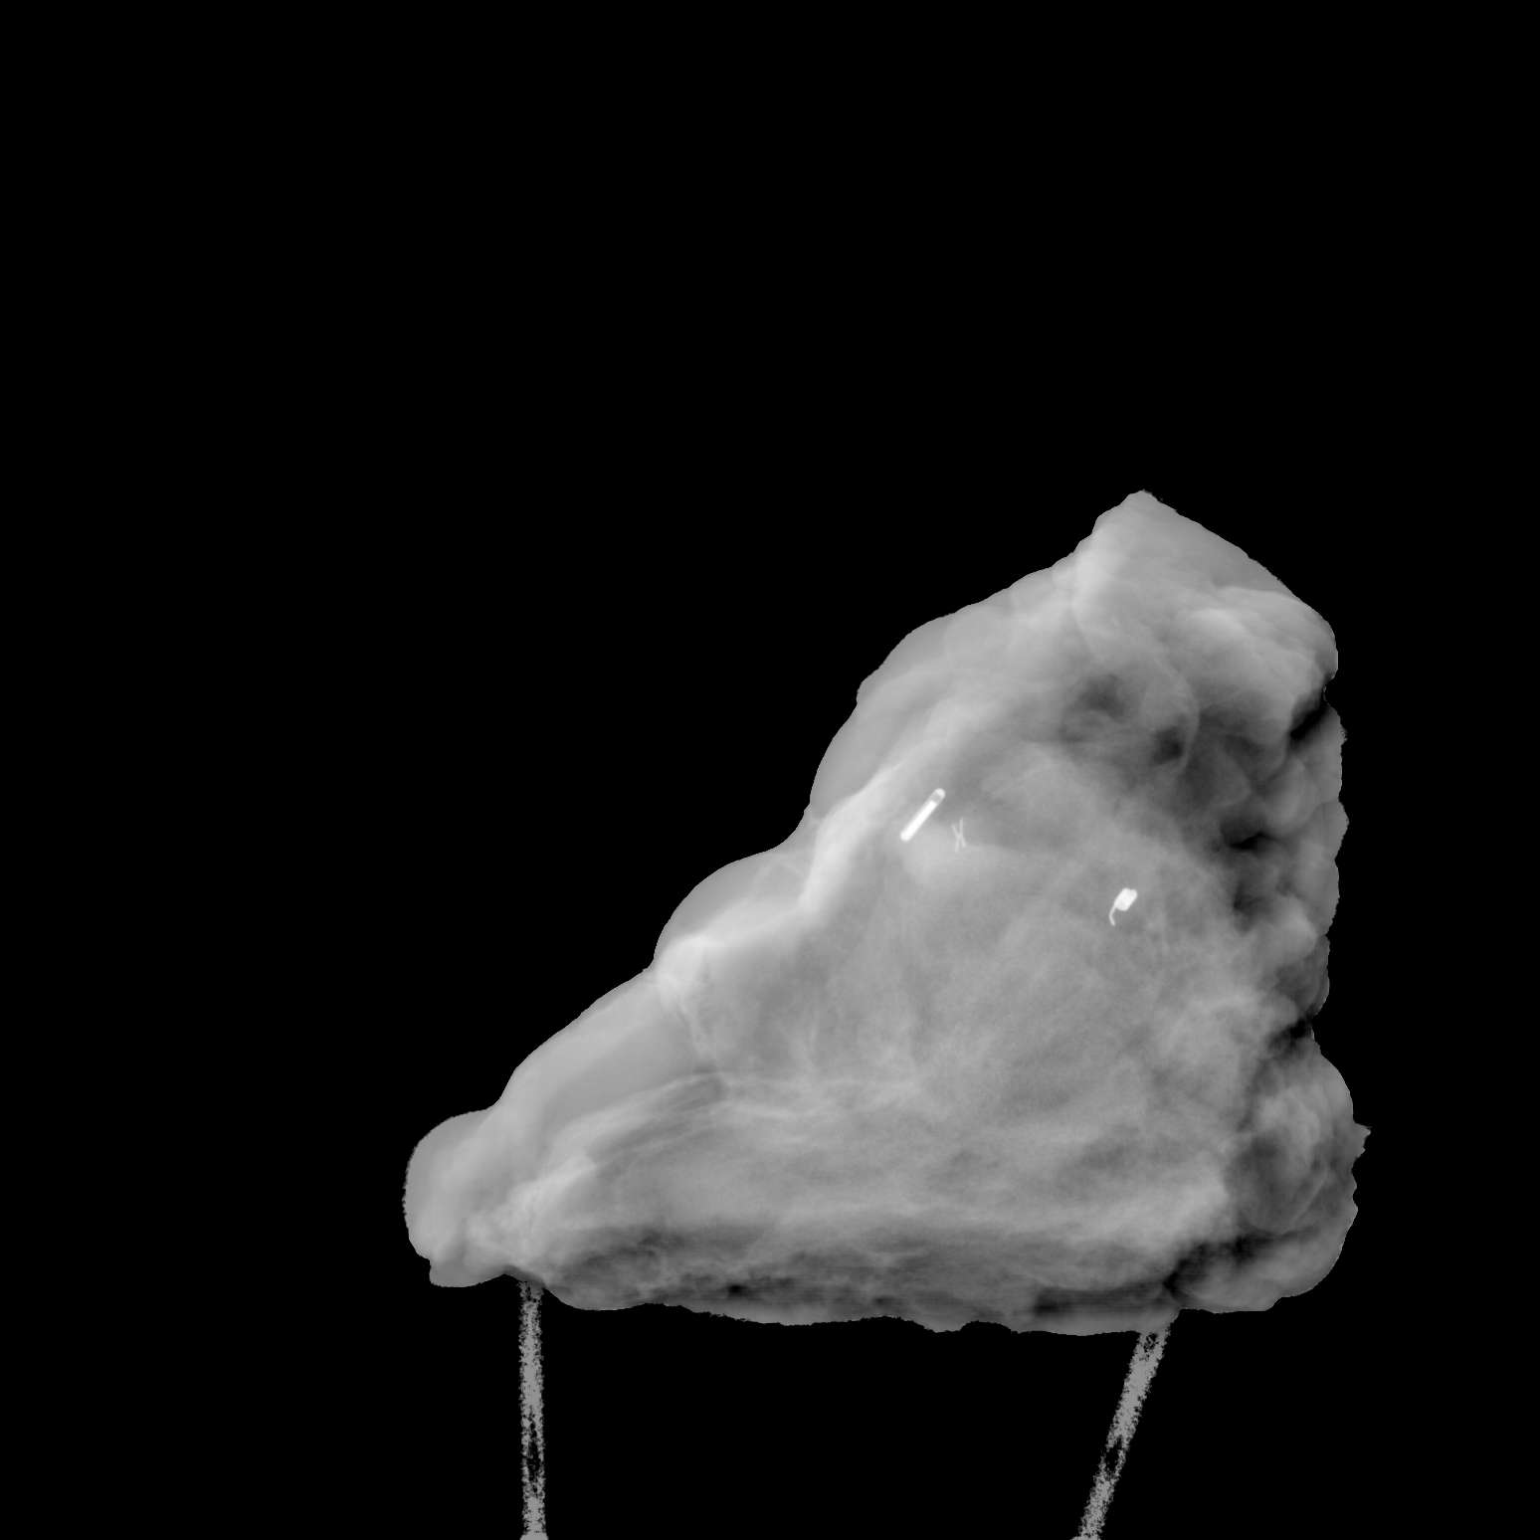
[im 2/2]
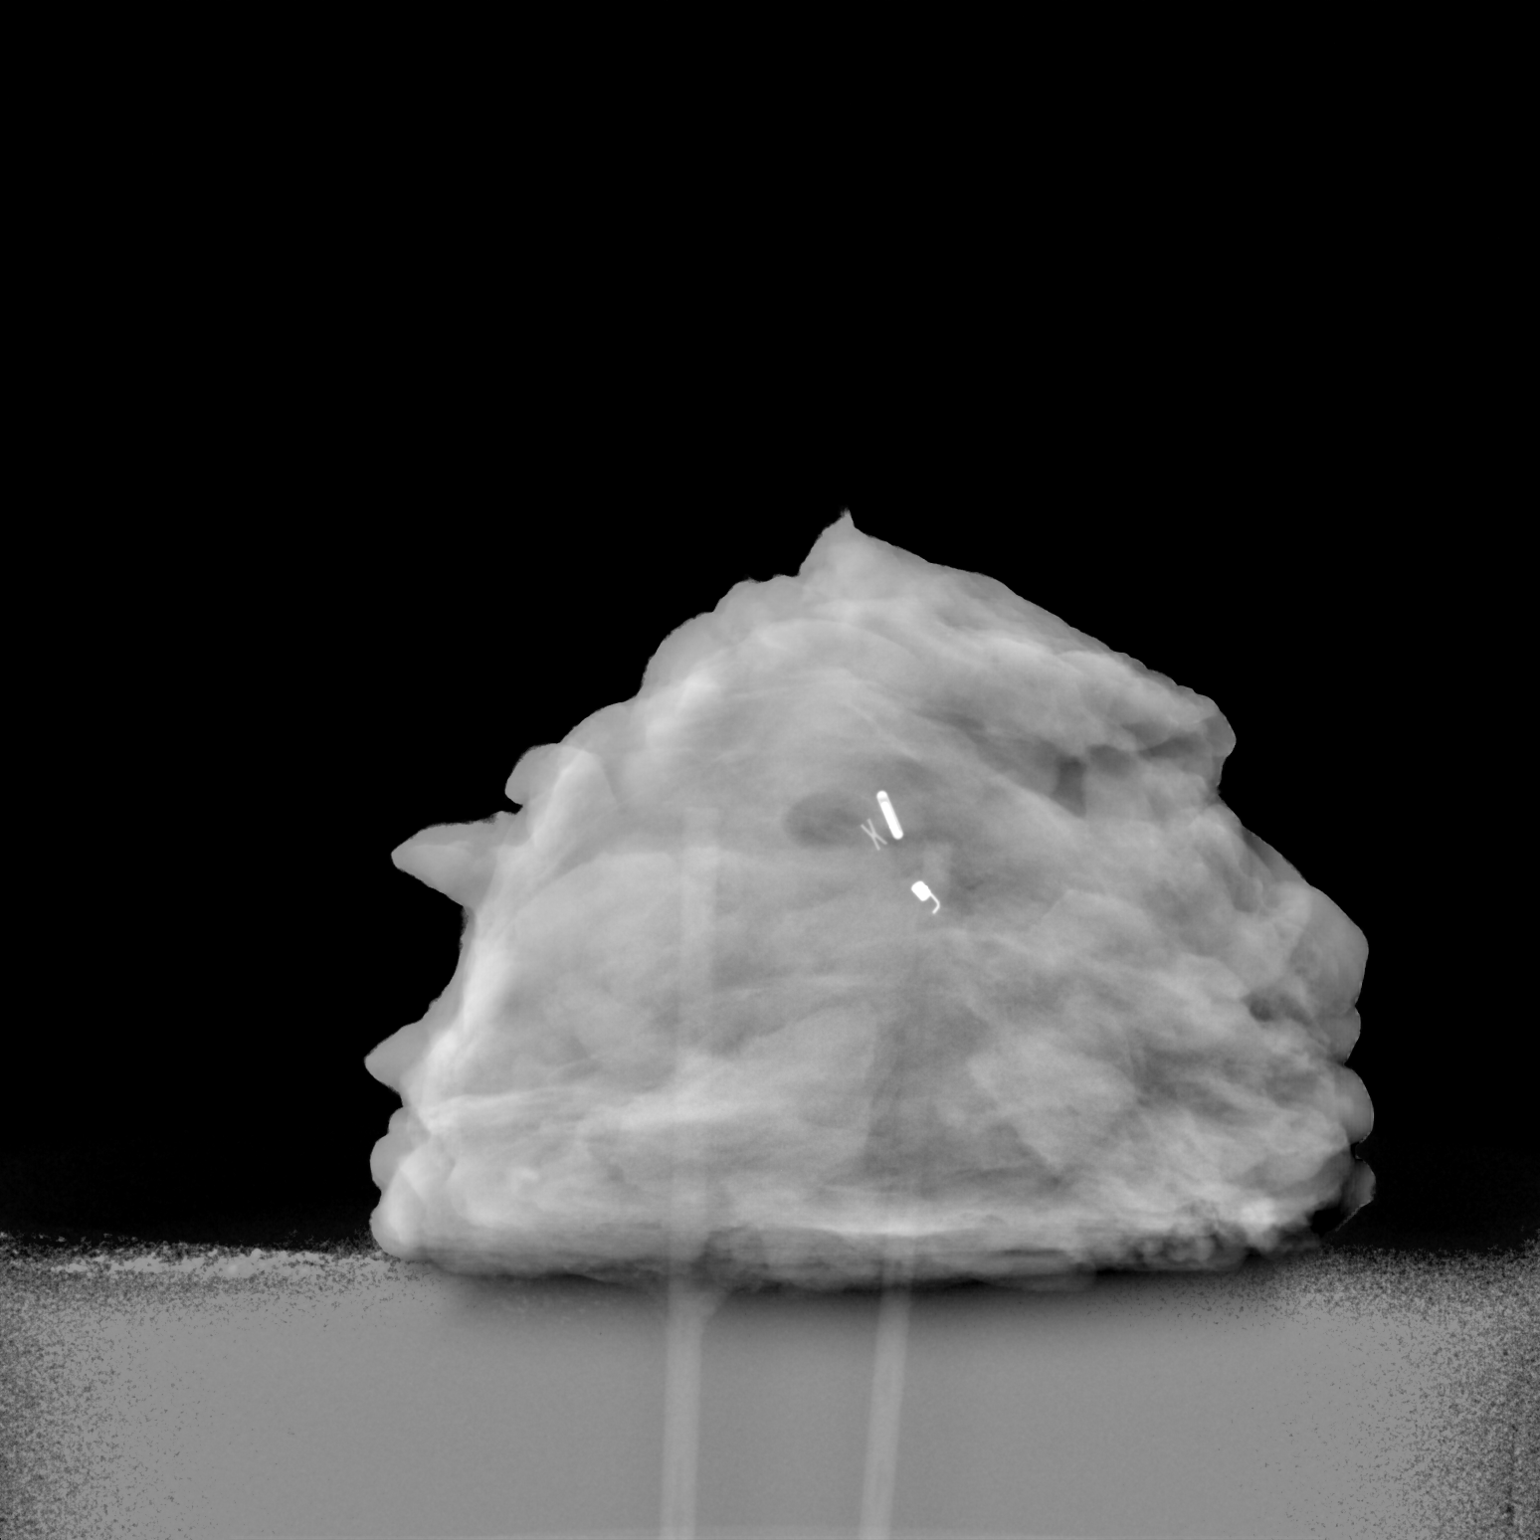

[2 of 2 positions shown; findings below may reference images not displayed]

FINDINGS: Status post excision of the right breast. The radioactive seed and
coil shaped biopsy marker clip are present, completely intact.
IMPRESSION: Specimen radiograph of the right breast.

## 2022-05-24 ENCOUNTER — Other Ambulatory Visit: Payer: Self-pay | Admitting: Physician Assistant

## 2022-05-24 DIAGNOSIS — Z1231 Encounter for screening mammogram for malignant neoplasm of breast: Secondary | ICD-10-CM

## 2022-07-20 ENCOUNTER — Ambulatory Visit: Payer: Medicare Other | Admitting: Family Medicine

## 2022-08-29 ENCOUNTER — Ambulatory Visit: Payer: Medicaid Other | Admitting: Hematology and Oncology

## 2023-01-20 ENCOUNTER — Ambulatory Visit: Payer: Medicare Other | Admitting: Family Medicine

## 2023-02-02 ENCOUNTER — Other Ambulatory Visit: Payer: Self-pay

## 2023-02-02 ENCOUNTER — Emergency Department: Payer: 59

## 2023-02-02 ENCOUNTER — Observation Stay
Admission: EM | Admit: 2023-02-02 | Discharge: 2023-02-06 | Disposition: A | Discharge status: Home / Self Care | Payer: 59 | Attending: Internal Medicine | Admitting: Internal Medicine

## 2023-02-02 DIAGNOSIS — R0602 Shortness of breath: Secondary | ICD-10-CM | POA: Diagnosis present

## 2023-02-02 DIAGNOSIS — C50312 Malignant neoplasm of lower-inner quadrant of left female breast: Secondary | ICD-10-CM | POA: Diagnosis not present

## 2023-02-02 DIAGNOSIS — I10 Essential (primary) hypertension: Secondary | ICD-10-CM | POA: Insufficient documentation

## 2023-02-02 DIAGNOSIS — J09X2 Influenza due to identified novel influenza A virus with other respiratory manifestations: Secondary | ICD-10-CM | POA: Insufficient documentation

## 2023-02-02 DIAGNOSIS — Z79899 Other long term (current) drug therapy: Secondary | ICD-10-CM | POA: Insufficient documentation

## 2023-02-02 DIAGNOSIS — F17210 Nicotine dependence, cigarettes, uncomplicated: Secondary | ICD-10-CM | POA: Insufficient documentation

## 2023-02-02 DIAGNOSIS — Z794 Long term (current) use of insulin: Secondary | ICD-10-CM | POA: Diagnosis not present

## 2023-02-02 DIAGNOSIS — Z20822 Contact with and (suspected) exposure to covid-19: Secondary | ICD-10-CM | POA: Diagnosis not present

## 2023-02-02 DIAGNOSIS — E1169 Type 2 diabetes mellitus with other specified complication: Secondary | ICD-10-CM | POA: Diagnosis present

## 2023-02-02 DIAGNOSIS — Z6837 Body mass index (BMI) 37.0-37.9, adult: Secondary | ICD-10-CM | POA: Insufficient documentation

## 2023-02-02 DIAGNOSIS — J441 Chronic obstructive pulmonary disease with (acute) exacerbation: Secondary | ICD-10-CM | POA: Diagnosis not present

## 2023-02-02 DIAGNOSIS — Z23 Encounter for immunization: Secondary | ICD-10-CM | POA: Diagnosis not present

## 2023-02-02 DIAGNOSIS — J9601 Acute respiratory failure with hypoxia: Secondary | ICD-10-CM

## 2023-02-02 DIAGNOSIS — E7800 Pure hypercholesterolemia, unspecified: Secondary | ICD-10-CM | POA: Diagnosis not present

## 2023-02-02 DIAGNOSIS — Z170 Estrogen receptor positive status [ER+]: Secondary | ICD-10-CM

## 2023-02-02 DIAGNOSIS — J111 Influenza due to unidentified influenza virus with other respiratory manifestations: Principal | ICD-10-CM

## 2023-02-02 DIAGNOSIS — J101 Influenza due to other identified influenza virus with other respiratory manifestations: Secondary | ICD-10-CM | POA: Insufficient documentation

## 2023-02-02 LAB — BASIC METABOLIC PANEL
Anion gap: 11 (ref 5–15)
BUN: 9 mg/dL (ref 8–23)
CO2: 25 mmol/L (ref 22–32)
Calcium: 8.5 mg/dL — ABNORMAL LOW (ref 8.9–10.3)
Chloride: 101 mmol/L (ref 98–111)
Creatinine, Ser: 0.69 mg/dL (ref 0.44–1.00)
GFR, Estimated: >60 mL/min (ref >60)
Glucose, Bld: 119 mg/dL — ABNORMAL HIGH (ref 70–99)
Potassium: 3.8 mmol/L (ref 3.5–5.1)
Sodium: 137 mmol/L (ref 135–145)

## 2023-02-02 LAB — RESP PANEL BY RT-PCR (RSV, FLU A&B, COVID)  RVPGX2
Influenza A by PCR: POSITIVE — AB
Influenza B by PCR: NEGATIVE
Resp Syncytial Virus by PCR: NEGATIVE
SARS Coronavirus 2 by RT PCR: NEGATIVE

## 2023-02-02 LAB — HEPATIC FUNCTION PANEL
ALT: 17 U/L (ref 0–44)
AST: 25 U/L (ref 15–41)
Albumin: 3.7 g/dL (ref 3.5–5.0)
Alkaline Phosphatase: 73 U/L (ref 38–126)
Bilirubin, Direct: <0.1 mg/dL (ref 0.0–0.2)
Total Bilirubin: 0.7 mg/dL (ref 0.0–1.2)
Total Protein: 7.1 g/dL (ref 6.5–8.1)

## 2023-02-02 LAB — CBC
HCT: 48.3 % — ABNORMAL HIGH (ref 36.0–46.0)
Hemoglobin: 15.3 g/dL — ABNORMAL HIGH (ref 12.0–15.0)
MCH: 29.1 pg (ref 26.0–34.0)
MCHC: 31.7 g/dL (ref 30.0–36.0)
MCV: 92 fL (ref 80.0–100.0)
Platelets: 222 10*3/uL (ref 150–400)
RBC: 5.25 MIL/uL — ABNORMAL HIGH (ref 3.87–5.11)
RDW: 14.4 % (ref 11.5–15.5)
WBC: 5.8 10*3/uL (ref 4.0–10.5)
nRBC: 0 % (ref 0.0–0.2)

## 2023-02-02 LAB — LIPASE, BLOOD: Lipase: 29 U/L (ref 11–51)

## 2023-02-02 LAB — TROPONIN I (HIGH SENSITIVITY)
Troponin I (High Sensitivity): 10 ng/L (ref <18)
Troponin I (High Sensitivity): 9 ng/L (ref <18)

## 2023-02-02 LAB — PROCALCITONIN: Procalcitonin: <0.1 ng/mL

## 2023-02-02 LAB — CBG MONITORING, ED: Glucose-Capillary: 330 mg/dL — ABNORMAL HIGH (ref 70–99)

## 2023-02-02 MED ORDER — ACETAMINOPHEN 325 MG PO TABS
650.0000 mg | ORAL_TABLET | Freq: Four times a day (QID) | ORAL | Status: DC | PRN
  Administered 2023-02-02 – 2023-02-03 (×2): 650 mg via ORAL
  Filled 2023-02-02 (×2): qty 2

## 2023-02-02 MED ORDER — INSULIN ASPART 100 UNIT/ML IJ SOLN
0.0000 [IU] | Freq: Three times a day (TID) | INTRAMUSCULAR | Status: DC
  Administered 2023-02-03 – 2023-02-04 (×3): 3 [IU] via SUBCUTANEOUS
  Administered 2023-02-04: 5 [IU] via SUBCUTANEOUS
  Administered 2023-02-04: 2 [IU] via SUBCUTANEOUS
  Administered 2023-02-05: 8 [IU] via SUBCUTANEOUS
  Administered 2023-02-05: 2 [IU] via SUBCUTANEOUS
  Filled 2023-02-02 (×6): qty 1

## 2023-02-02 MED ORDER — ACETAMINOPHEN 500 MG PO TABS
1000.0000 mg | ORAL_TABLET | Freq: Once | ORAL | Status: AC
  Administered 2023-02-02: 1000 mg via ORAL
  Filled 2023-02-02: qty 2

## 2023-02-02 MED ORDER — ROSUVASTATIN CALCIUM 10 MG PO TABS
20.0000 mg | ORAL_TABLET | Freq: Every day | ORAL | Status: DC
Start: 2023-02-02 — End: 2023-02-06
  Administered 2023-02-02 – 2023-02-05 (×4): 20 mg via ORAL
  Filled 2023-02-02 (×2): qty 1
  Filled 2023-02-02 (×3): qty 2

## 2023-02-02 MED ORDER — INSULIN ASPART 100 UNIT/ML IJ SOLN
0.0000 [IU] | Freq: Every day | INTRAMUSCULAR | Status: DC
  Administered 2023-02-02: 4 [IU] via SUBCUTANEOUS
  Administered 2023-02-03 – 2023-02-04 (×2): 2 [IU] via SUBCUTANEOUS
  Administered 2023-02-05: 4 [IU] via SUBCUTANEOUS
  Filled 2023-02-02 (×4): qty 1

## 2023-02-02 MED ORDER — HYDRALAZINE HCL 20 MG/ML IJ SOLN
5.0000 mg | Freq: Four times a day (QID) | INTRAMUSCULAR | Status: DC | PRN
Start: 2023-02-02 — End: 2023-02-06

## 2023-02-02 MED ORDER — ACETAMINOPHEN 650 MG RE SUPP: 650.0000 mg | Freq: Four times a day (QID) | RECTAL | Status: DC | PRN

## 2023-02-02 MED ORDER — LIDOCAINE 5 % EX PTCH
1.0000 | MEDICATED_PATCH | CUTANEOUS | Status: DC
  Administered 2023-02-02 – 2023-02-06 (×5): 1 via TRANSDERMAL
  Filled 2023-02-02 (×4): qty 1

## 2023-02-02 MED ORDER — GABAPENTIN 400 MG PO CAPS
400.0000 mg | ORAL_CAPSULE | Freq: Three times a day (TID) | ORAL | Status: DC
  Administered 2023-02-02 – 2023-02-06 (×11): 400 mg via ORAL
  Filled 2023-02-02 (×11): qty 1

## 2023-02-02 MED ORDER — LIDOCAINE 5 % EX PTCH: 1.0000 | MEDICATED_PATCH | Freq: Every day | CUTANEOUS | Status: DC | PRN

## 2023-02-02 MED ORDER — ENOXAPARIN SODIUM 40 MG/0.4ML IJ SOSY: 40.0000 mg | PREFILLED_SYRINGE | INTRAMUSCULAR | Status: DC

## 2023-02-02 MED ORDER — ENOXAPARIN SODIUM 60 MG/0.6ML IJ SOSY
0.5000 mg/kg | PREFILLED_SYRINGE | INTRAMUSCULAR | Status: DC
  Administered 2023-02-02 – 2023-02-05 (×4): 55 mg via SUBCUTANEOUS
  Filled 2023-02-02 (×4): qty 0.6

## 2023-02-02 MED ORDER — METHYLPREDNISOLONE SODIUM SUCC 125 MG IJ SOLR
125.0000 mg | Freq: Every day | INTRAMUSCULAR | Status: AC
  Administered 2023-02-02: 125 mg via INTRAVENOUS
  Filled 2023-02-02: qty 2

## 2023-02-02 MED ORDER — MOMETASONE FURO-FORMOTEROL FUM 200-5 MCG/ACT IN AERO
2.0000 | INHALATION_SPRAY | Freq: Two times a day (BID) | RESPIRATORY_TRACT | Status: DC
  Administered 2023-02-02 – 2023-02-06 (×8): 2 via RESPIRATORY_TRACT
  Filled 2023-02-02: qty 8.8

## 2023-02-02 MED ORDER — PREDNISONE 20 MG PO TABS
40.0000 mg | ORAL_TABLET | Freq: Every day | ORAL | Status: AC
  Administered 2023-02-03 – 2023-02-06 (×4): 40 mg via ORAL
  Filled 2023-02-02 (×4): qty 2

## 2023-02-02 MED ORDER — ANASTROZOLE 1 MG PO TABS
1.0000 mg | ORAL_TABLET | Freq: Every day | ORAL | Status: DC
  Administered 2023-02-03 – 2023-02-06 (×4): 1 mg via ORAL
  Filled 2023-02-02 (×5): qty 1

## 2023-02-02 MED ORDER — IPRATROPIUM-ALBUTEROL 0.5-2.5 (3) MG/3ML IN SOLN
3.0000 mL | Freq: Four times a day (QID) | RESPIRATORY_TRACT | Status: AC
  Administered 2023-02-02 – 2023-02-03 (×3): 3 mL via RESPIRATORY_TRACT
  Filled 2023-02-02 (×3): qty 3

## 2023-02-02 MED ORDER — ONDANSETRON HCL 4 MG PO TABS: 4.0000 mg | ORAL_TABLET | Freq: Four times a day (QID) | ORAL | Status: DC | PRN

## 2023-02-02 MED ORDER — AMLODIPINE BESYLATE 5 MG PO TABS
5.0000 mg | ORAL_TABLET | Freq: Every day | ORAL | Status: DC
  Administered 2023-02-03 – 2023-02-06 (×4): 5 mg via ORAL
  Filled 2023-02-02 (×4): qty 1

## 2023-02-02 MED ORDER — OSELTAMIVIR PHOSPHATE 75 MG PO CAPS
75.0000 mg | ORAL_CAPSULE | Freq: Two times a day (BID) | ORAL | Status: AC
  Administered 2023-02-02 – 2023-02-04 (×5): 75 mg via ORAL
  Filled 2023-02-02 (×5): qty 1

## 2023-02-02 MED ORDER — ONDANSETRON HCL 4 MG/2ML IJ SOLN: 4.0000 mg | Freq: Four times a day (QID) | INTRAMUSCULAR | Status: DC | PRN

## 2023-02-02 MED ORDER — SENNOSIDES-DOCUSATE SODIUM 8.6-50 MG PO TABS: 1.0000 | ORAL_TABLET | Freq: Every evening | ORAL | Status: DC | PRN

## 2023-02-02 NOTE — ED Provider Notes (Signed)
[facility] Provider Note    Event Date/Time   First MD Initiated Contact with Patient 02/02/23 1329     (approximate)   History   Cough and Shortness of Breath   HPI  Caitlin Rivera is a 67 y.o. female with diabetes, hypertension hyperlipidemia who comes in with concerns for cough, shortness of breath.  Patient reports stating that she started to feel ill last night.  She reports significant pain on her left side chest.  She reports having shortness of breath.  She states that the person she lives with is immunosuppressed so she cannot stay there.   Physical Exam   Triage Vital Signs: ED Triage Vitals  Encounter Vitals Group     BP 02/02/23 1132 (!) 161/76     Systolic BP Percentile --      Diastolic BP Percentile --      Pulse Rate 02/02/23 1132 97     Resp 02/02/23 1132 (!) 23     Temp 02/02/23 1132 98.7 F (37.1 C)     Temp src --      SpO2 02/02/23 1132 90 %     Weight 02/02/23 1133 240 lb (108.9 kg)     Height 02/02/23 1133 5\' 7"  (1.702 m)     Head Circumference --      Peak Flow --      Pain Score 02/02/23 1133 0     Pain Loc --      Pain Education --      Exclude from Growth Chart --     Most recent vital signs: Vitals:   02/02/23 1132  BP: (!) 161/76  Pulse: 97  Resp: (!) 23  Temp: 98.7 F (37.1 C)  SpO2: 90%     General: Awake, no distress.  CV:  Good peripheral perfusion.  Resp:  Normal effort.  Clear lungs Abd:  No distention.  Soft and nontender Other:     ED Results / Procedures / Treatments   Labs (all labs ordered are listed, but only abnormal results are displayed) Labs Reviewed  RESP PANEL BY RT-PCR (RSV, FLU A&B, COVID)  [id number] - Abnormal; Notable for the following components:      Result Value   Influenza A by PCR POSITIVE (*)    All other components within normal limits  BASIC METABOLIC PANEL - Abnormal; Notable for the following components:   Glucose, Bld 119 (*)    Calcium 8.5 (*)    All  other components within normal limits  CBC - Abnormal; Notable for the following components:   RBC 5.25 (*)    Hemoglobin 15.3 (*)    HCT 48.3 (*)    All other components within normal limits  TROPONIN I (HIGH SENSITIVITY)  TROPONIN I (HIGH SENSITIVITY)     EKG  My interpretation of EKG:  Normal sinus rhythm 93 without any ST elevation or T wave inversions, normal intervals  RADIOLOGY I have reviewed the xray personally and interpreted no focal pneumonia  PROCEDURES:  Critical Care performed: Yes, see critical care procedure note(s)  .1-3 Lead EKG Interpretation  Performed by: Concha Se, MD Authorized by: Concha Se, MD     Interpretation: abnormal     ECG rate:  90   ECG rate assessment: normal     Rhythm: sinus rhythm     Ectopy: none     Conduction: normal   .Critical Care  Performed by: Concha Se, MD Authorized by: Fuller Plan,  Alben Spittle, MD   Critical care provider statement:    Critical care time (minutes):  30   Critical care was necessary to treat or prevent imminent or life-threatening deterioration of the following conditions:  Respiratory failure   Critical care was time spent personally by me on the following activities:  Development of treatment plan with patient or surrogate, discussions with consultants, evaluation of patient's response to treatment, examination of patient, ordering and review of laboratory studies, ordering and review of radiographic studies, ordering and performing treatments and interventions, pulse oximetry, re-evaluation of patient's condition and review of old charts    MEDICATIONS ORDERED IN ED: Medications  acetaminophen (TYLENOL) tablet 1,000 mg (has no administration in time range)  lidocaine (LIDODERM) 5 % 1 patch (has no administration in time range)  oseltamivir (TAMIFLU) capsule 75 mg (has no administration in time range)     IMPRESSION / MDM / ASSESSMENT AND PLAN / ED COURSE  I reviewed the triage vital signs and  the nursing notes.   Patient's presentation is most consistent with acute presentation with potential threat to life or bodily function.   Patient comes in with hypoxia not a 90% shortness of breath chest pain.  Differentials flu, COVID, pneumonia, no wheezing on examination.  Patient placed on 2 L.  Patient is flu positive.  Troponin negative CBC reassuring CMP reassuring.  Discussed with patient and will admit for symptomatic treatment given significant shortness of breath with hypoxia.  The patient is on the cardiac monitor to evaluate for evidence of arrhythmia and/or significant heart rate changes.      FINAL CLINICAL IMPRESSION(S) / ED DIAGNOSES   Final diagnoses:  Influenza  Acute hypoxic respiratory failure (HCC)     Rx / DC Orders   ED Discharge Orders     None        Note:  This document was prepared using Dragon voice recognition software and may include unintentional dictation errors.   Concha Se, MD 02/02/23 1520

## 2023-02-02 NOTE — Assessment & Plan Note (Signed)
Suspect secondary to influenza A Check procalcitonin, MRSA PCR DuoNebs 4 times daily, 3 doses ordered Solu-Medrol 125 mg IV one-time dose, followed by prednisone p.o. for 02/03/2023 Oxygen therapy as needed to maintain SpO2 greater than 92% Continuous pulse oximetry ordered

## 2023-02-02 NOTE — Hospital Course (Signed)
[name] is a 68 year old female with history of hypertension, tobacco use, COPD, morbid obesity, displaced housing, currently staying with a friend, who presents emergency department for chief concerns of shortness of breath.  Vitals in the ED showed temperature of 98.7, respiration rate 23, heart rate of 97, blood pressure 161/76, SpO2 of 90% on room air.  Serum sodium is 137, potassium 3.3, chloride 101, bicarb 25, BUN of 9, serum creatinine 0.69, EGFR greater than 60, nonfasting blood glucose 119, WBC 5.8, hemoglobin 15.3, platelets of 222.  High sensitive troponin was 10.  Patient tested positive for influenza A.  Influenza B/RSV/COVID PCR were negative.  ED treatment: Acetaminophen 1000 mg p.o. one-time dose.

## 2023-02-02 NOTE — Assessment & Plan Note (Signed)
Hydralazine 5 mg every 6 hours as needed for SBP greater 165, 5 days ordered

## 2023-02-02 NOTE — Assessment & Plan Note (Signed)
Home anastrozole 1 mg daily resumed for 02/03/2023

## 2023-02-02 NOTE — Progress Notes (Signed)
PHARMACIST - PHYSICIAN COMMUNICATION  CONCERNING:  Enoxaparin (Lovenox) for DVT Prophylaxis    RECOMMENDATION: Patient was prescribed enoxaprin 40mg  q24 hours for VTE prophylaxis.   Filed Weights   02/02/23 1133  Weight: 108.9 kg (240 lb)    Body mass index is 37.59 kg/m.  Estimated Creatinine Clearance: 89.1 mL/min (by C-G formula based on SCr of 0.69 mg/dL).   Based on [facility] policy patient is candidate for enoxaparin 0.5mg /kg TBW SQ every 24 hours based on BMI being >30.   DESCRIPTION: Pharmacy has adjusted enoxaparin dose per [facility] policy.  Patient is now receiving enoxaparin 55 mg every 24 hours    Lyndel Dancel Rodriguez-Guzman PharmD, BCPS 02/02/2023 2:21 PM

## 2023-02-02 NOTE — H&P (Addendum)
History and Physical   Caitlin Rivera:[id number] DOB: [birth year] DOA: 02/02/2023  PCP: Jackie Plum, MD Outpatient Specialists: Dr. Rachel Moulds, medical oncology Patient coming from: Home  I have personally briefly reviewed patient's old medical records in [facility] EMR.  Chief Concern: Shortness of breath  HPI: [name] is a 68 year old female with history of hypertension, tobacco use, COPD, morbid obesity, displaced housing, currently staying with a friend, who presents emergency department for chief concerns of shortness of breath.  Vitals in the ED showed temperature of 98.7, respiration rate 23, heart rate of 97, blood pressure 161/76, SpO2 of 90% on room air.  Serum sodium is 137, potassium 3.3, chloride 101, bicarb 25, BUN of 9, serum creatinine 0.69, EGFR greater than 60, nonfasting blood glucose 119, WBC 5.8, hemoglobin 15.3, platelets of 222.  High sensitive troponin was 10.  Patient tested positive for influenza A.  Influenza B/RSV/COVID PCR were negative.  ED treatment: Acetaminophen 1000 mg p.o. one-time dose. ------------------------------- At bedside, patient able to tell me her first and last name, age, location, current calendar year.  She reports she has been short of breath for about 1 day.  She reports she has been having a dry nonproductive cough.  She denies known sick contacts.  She reports that she smokes 1 pack of cigarettes per day and 2 days ago she used crack cocaine.   Social history: She has displaced housing.  She was living with a friend who states that she cannot stay with them as she has the flu.  She endorses 1 pack of cigarettes per day.  She denies EtOH use.  She endorses occasional crack cocaine use.  ROS: Constitutional: no weight change, no fever ENT/Mouth: no sore throat, no rhinorrhea Eyes: no eye pain, no vision changes Cardiovascular: no chest pain, + dyspnea,  no edema, no palpitations Respiratory: + cough,  no sputum, no wheezing Gastrointestinal: no nausea, no vomiting, no diarrhea, no constipation Genitourinary: no urinary incontinence, no dysuria, no hematuria Musculoskeletal: no arthralgias, no myalgias Skin: no skin lesions, no pruritus, Neuro: no weakness, no loss of consciousness, no syncope Psych: no anxiety, no depression, + decrease appetite Heme/Lymph: no bruising, no bleeding  ED Course: Discussed with EDP, patient requiring hospitalization for chief concerns of shortness of breath.  Assessment/Plan  Principal Problem:   COPD exacerbation (HCC) Active Problems:   Malignant neoplasm of lower-inner quadrant of left breast in female, estrogen receptor positive (HCC)   Morbid obesity (HCC)   Type 2 diabetes mellitus with obesity (HCC)   Hypercholesterolemia   Hypertension   Influenza A   Assessment and Plan:  * COPD exacerbation (HCC) Suspect secondary to influenza A Check procalcitonin, MRSA PCR DuoNebs 4 times daily, 3 doses ordered Solu-Medrol 125 mg IV one-time dose, followed by prednisone p.o. for 02/03/2023 Oxygen therapy as needed to maintain SpO2 greater than 92% Continuous pulse oximetry ordered  Influenza A Tamiflu 75 mg p.o. twice daily initiated by EDP for 5 days course  Hypertension Hydralazine 5 mg every 6 hours as needed for SBP greater 165, 5 days ordered  Type 2 diabetes mellitus with obesity (HCC) Home metformin will not be resumed on admission Insulin SSI with at bedtime coverage ordered Goal inpatient blood glucose levels 140-180  Morbid obesity (HCC) This meets criteria for morbid obesity based on the presence of 1 or more chronic comorbidities. Patient has BMI 37.59 and hypertension. This complicates overall care and prognosis.   Malignant neoplasm of lower-inner quadrant of left  breast in female, estrogen receptor positive (HCC) Home anastrozole 1 mg daily resumed for 02/03/2023  Chart reviewed.   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   COPD (chronic obstructive pulmonary disease) (HCC)    smoker   Diabetes mellitus without complication (HCC)    Fibroids    Hypertension    Personal history of radiation therapy    Smoker    Past Surgical History:  Procedure Laterality Date   BREAST BIOPSY Bilateral 06/03/2019   BREAST BIOPSY Left 06/14/2019   BREAST EXCISIONAL BIOPSY Right 07/2019   BREAST LUMPECTOMY Left 07/2019   BREAST LUMPECTOMY WITH RADIOACTIVE SEED LOCALIZATION Bilateral 07/31/2019   Procedure: BILATERAL BREAST LUMPECTOMY WITH RADIOACTIVE SEED LOCALIZATION;  Surgeon: Griselda Miner, MD;  Location: [facility];  Service: General;  Laterality: Bilateral;   TUBAL LIGATION     TUBAL LIGATION     Social History:  reports that she has been smoking cigarettes. She has never used smokeless tobacco. She reports current drug use. Drug: "Crack" cocaine. She reports that she does not drink alcohol.  Allergies  Allergen Reactions   Morphine And Codeine Itching   Ibuprofen Itching   Family History  Problem Relation Age of Onset   Cancer Mother    Family history: Family history reviewed and not pertinent.  Prior to Admission medications   Medication Sig Start Date End Date Taking? Authorizing Provider  acetaminophen (TYLENOL 8 HOUR) 650 MG CR tablet Take 1 tablet (650 mg total) by mouth every 8 (eight) hours as needed for pain or fever. 02/25/20   Derwood Kaplan, MD  albuterol (VENTOLIN HFA) 108 (90 Base) MCG/ACT inhaler  05/29/19   [provider]  amLODipine (NORVASC) 5 MG tablet Take 5 mg by mouth daily. 06/30/19   [provider]  amoxicillin (AMOXIL) 500 MG  tablet Take 500 mg by mouth 3 (three) times daily. 08/21/19   [provider]  anastrozole (ARIMIDEX) 1 MG tablet Take 1 tablet (1 mg total) by mouth daily. 09/10/19   Magrinat, Valentino Hue, MD  gabapentin (NEURONTIN) 400 MG capsule Take 400 mg by mouth 3 (three) times daily. 06/17/19   [provider]  Lidocaine 4 % PTCH Apply 1 patch topically 2 (two) times daily. 02/25/20   Derwood Kaplan, MD  metFORMIN (GLUCOPHAGE) 500 MG tablet Take 500 mg by mouth 2 (two) times daily. 05/26/19   [provider]  OVER THE COUNTER MEDICATION Take 1-2 tablets by mouth 2 (two) times daily as needed (for pain). OTC Pain Reliever PM    [provider]  rosuvastatin (CRESTOR) 20 MG tablet Take 20 mg by mouth daily. 06/30/19   [provider]  SYMBICORT 160-4.5 MCG/ACT inhaler  05/31/19   [provider]  tiZANidine (ZANAFLEX) 4 MG tablet Take 4 mg by mouth every 8 (eight) hours as needed. 06/17/19   [provider]   Physical Exam: Vitals:   02/02/23 1132 02/02/23 1133 02/02/23 1537  BP: (!) 161/76  (!) 157/80  Pulse: 97  90  Resp: (!) 23  20  Temp: 98.7 F (37.1 C)  98 F (36.7 C)  TempSrc:   Oral  SpO2: 90%  99%  Weight:  108.9  kg   Height:  5\' 7"  (1.702 m)    Constitutional: appears older than chronological age Eyes: PERRL, lids and conjunctivae normal ENMT: Mucous membranes are moist. Posterior pharynx clear of any exudate or lesions. Age-appropriate dentition. Hearing appropriate Neck: normal, supple, no masses, no thyromegaly Respiratory: Generalized decreased lung sounds bilaterally, with end expiratory wheezing on auscultation, no crackles. Normal respiratory effort. No accessory muscle use. Nasal cannula in place Cardiovascular: Regular rate and rhythm, no murmurs / rubs / gallops. No extremity edema. 2+ pedal pulses. No carotid bruits.  Abdomen: Morbidly obese abdomen, no tenderness, no masses palpated, no hepatosplenomegaly. Bowel sounds  positive.  Musculoskeletal: no clubbing / cyanosis. No joint deformity upper and lower extremities. Good ROM, no contractures, no atrophy. Normal muscle tone.  Skin: no rashes, lesions, ulcers. No induration Neurologic: Sensation intact. Strength 5/5 in all 4.  Psychiatric: Normal judgment and insight. Alert and oriented x 3. Normal mood.   EKG: independently reviewed, showing sinus rhythm with rate of 93, QTc 437  Chest x-ray on Admission: I personally reviewed and I agree with radiologist reading as below.  DG Chest 2 View Result Date: 02/02/2023 CLINICAL DATA:  68 year old female with shortness of breath, cough, left side pain. Smoker. EXAM: CHEST - 2 VIEW COMPARISON:  Chest radiographs 02/25/2020 and earlier. FINDINGS: AP and lateral views 1145 hours. Coarse chronic bilateral pulmonary interstitial opacity does not appear significantly changed since 2017, probably smoking related. Larger lung volumes since that time. Normal cardiac size and mediastinal contours. Visualized tracheal air column is within normal limits. No pneumothorax, pulmonary edema, pleural effusion or acute pulmonary opacity. No acute osseous abnormality identified. Negative visible bowel gas. IMPRESSION: No acute cardiopulmonary abnormality. Chronic probably smoking related interstitial lung changes are suspected. Electronically Signed   By: Odessa Fleming M.D.   On: 02/02/2023 12:30   Labs on Admission: I have personally reviewed following labs  CBC: Recent Labs  Lab 02/02/23 1135  WBC 5.8  HGB 15.3*  HCT 48.3*  MCV 92.0  PLT 222   Basic Metabolic Panel: Recent Labs  Lab 02/02/23 1135  NA 137  K 3.8  CL 101  CO2 25  GLUCOSE 119*  BUN 9  CREATININE 0.69  CALCIUM 8.5*   GFR: Estimated Creatinine Clearance: 89.1 mL/min (by C-G formula based on SCr of 0.69 mg/dL).  Liver Function Tests: Recent Labs  Lab 02/02/23 1135  AST 25  ALT 17  ALKPHOS 73  BILITOT 0.7  PROT 7.1  ALBUMIN 3.7   Recent Labs  Lab  02/02/23 1135  LIPASE 29   Urine analysis:    Component Value Date/Time   COLORURINE YELLOW 08/21/2013 1847   APPEARANCEUR CLOUDY (A) 08/21/2013 1847   LABSPEC 1.027 08/21/2013 1847   PHURINE 5.5 08/21/2013 1847   GLUCOSEU NEGATIVE 08/21/2013 1847   HGBUR NEGATIVE 08/21/2013 1847   BILIRUBINUR NEGATIVE 08/21/2013 1847   KETONESUR 15 (A) 08/21/2013 1847   PROTEINUR NEGATIVE 08/21/2013 1847   UROBILINOGEN 1.0 08/21/2013 1847   NITRITE NEGATIVE 08/21/2013 1847   LEUKOCYTESUR SMALL (A) 08/21/2013 1847   This document was prepared using Dragon Voice Recognition software and may include unintentional dictation errors.  Dr. Sedalia Muta [facility]  If 7PM-7AM, please contact overnight-coverage provider If 7AM-7PM, please contact day attending provider [email or web address]  02/02/2023, 6:45 PM

## 2023-02-02 NOTE — ED Triage Notes (Signed)
Pt to ED for left side pain started last night with coughing. Denies urinary sx, n/v. 90% on RA in triage, +shob. Placed on 2 L [place]

## 2023-02-02 NOTE — Assessment & Plan Note (Signed)
Home metformin will not be resumed on admission Insulin SSI with at bedtime coverage ordered Goal inpatient blood glucose levels 140-180

## 2023-02-02 NOTE — ED Notes (Signed)
This tech provided pt with peanut butter and crackers and an ice cream cup.

## 2023-02-02 NOTE — Assessment & Plan Note (Signed)
This meets criteria for morbid obesity based on the presence of 1 or more chronic comorbidities. Patient has BMI 37.59 and hypertension. This complicates overall care and prognosis.

## 2023-02-02 NOTE — Assessment & Plan Note (Signed)
Tamiflu 75 mg p.o. twice daily initiated by EDP for 5 days course

## 2023-02-03 ENCOUNTER — Observation Stay: Payer: 59

## 2023-02-03 DIAGNOSIS — J441 Chronic obstructive pulmonary disease with (acute) exacerbation: Secondary | ICD-10-CM | POA: Diagnosis not present

## 2023-02-03 LAB — BASIC METABOLIC PANEL
Anion gap: 9 (ref 5–15)
BUN: 16 mg/dL (ref 8–23)
CO2: 26 mmol/L (ref 22–32)
Calcium: 8.8 mg/dL — ABNORMAL LOW (ref 8.9–10.3)
Chloride: 103 mmol/L (ref 98–111)
Creatinine, Ser: 0.72 mg/dL (ref 0.44–1.00)
GFR, Estimated: >60 mL/min (ref >60)
Glucose, Bld: 175 mg/dL — ABNORMAL HIGH (ref 70–99)
Potassium: 3.8 mmol/L (ref 3.5–5.1)
Sodium: 138 mmol/L (ref 135–145)

## 2023-02-03 LAB — CBC
HCT: 45.8 % (ref 36.0–46.0)
Hemoglobin: 14.7 g/dL (ref 12.0–15.0)
MCH: 29.2 pg (ref 26.0–34.0)
MCHC: 32.1 g/dL (ref 30.0–36.0)
MCV: 90.9 fL (ref 80.0–100.0)
Platelets: 227 10*3/uL (ref 150–400)
RBC: 5.04 MIL/uL (ref 3.87–5.11)
RDW: 14.3 % (ref 11.5–15.5)
WBC: 5 10*3/uL (ref 4.0–10.5)
nRBC: 0 % (ref 0.0–0.2)

## 2023-02-03 LAB — CBG MONITORING, ED
Glucose-Capillary: 103 mg/dL — ABNORMAL HIGH (ref 70–99)
Glucose-Capillary: 172 mg/dL — ABNORMAL HIGH (ref 70–99)
Glucose-Capillary: 177 mg/dL — ABNORMAL HIGH (ref 70–99)

## 2023-02-03 LAB — HEMOGLOBIN A1C
Hgb A1c MFr Bld: 6.6 % — ABNORMAL HIGH (ref 4.8–5.6)
Mean Plasma Glucose: 142.72 mg/dL

## 2023-02-03 MED ORDER — OXYCODONE-ACETAMINOPHEN 5-325 MG PO TABS
1.0000 | ORAL_TABLET | Freq: Four times a day (QID) | ORAL | Status: DC | PRN
  Administered 2023-02-03 – 2023-02-04 (×2): 1 via ORAL
  Filled 2023-02-03 (×2): qty 1

## 2023-02-03 MED ORDER — INFLUENZA VAC A&B SURF ANT ADJ 0.5 ML IM SUSY
0.5000 mL | PREFILLED_SYRINGE | INTRAMUSCULAR | Status: DC
  Filled 2023-02-03: qty 0.5

## 2023-02-03 NOTE — ED Notes (Signed)
Pt ambulated to and from restroom. NADN

## 2023-02-03 NOTE — Progress Notes (Addendum)
Patient ambulated to bathroom independently on Ra. Once returned to room patient had some dyspnea however sats maintained 92%. No need for oxygen at this time. Patient able to return to regular work of breathing within a minute. Provider notified.

## 2023-02-03 NOTE — TOC Initial Note (Signed)
Transition of Care [facility]) - Initial/Assessment Note    Patient Details  Name: Caitlin Rivera MRN: [id number] Date of Birth: January 14, 1955  Transition [facility]) CM/SW Contact:    Margarito Liner, LCSW Phone Number: 02/03/2023, 12:48 PM  Clinical Narrative:  CSW met with patient. No supports at bedside. CSW introduced role and inquired about housing concerns. Patient confirmed she has no where to go at discharge. CSW provided shelter information for [place] and [place]. Patient said the [place] shelter won't take her. When asked why, she said when she's been there twice they have told her they don't have beds. The last time this occurred was last year so CSW encouraged her to try calling them again. Patient does not want to go to [place]. Discussed getting a hotel. Patient stated that would be an option but she does not get her check until the first of the month. Patient confirmed she is not on oxygen chronically. CSW will follow for this potential need. No further concerns. Patient stated someone should be able to pick her up at discharge but if not, she would need a cab.                Expected Discharge Plan:  (TBD) Barriers to Discharge: Continued Medical Work up   Patient Goals and CMS Choice            Expected Discharge Plan and Services     Post Acute Care Choice: NA Living arrangements for the past 2 months: Single Family Home                                      Prior Living Arrangements/Services Living arrangements for the past 2 months: Single Family Home Lives with:: Friends Patient language and need for interpreter reviewed:: Yes        Need for Family Participation in Patient Care: Yes (Comment)     Criminal Activity/Legal Involvement Pertinent to Current Situation/Hospitalization: No - Comment as needed  Activities of Daily Living      Permission Sought/Granted                  Emotional Assessment Appearance:: Appears  stated age Attitude/Demeanor/Rapport: Engaged, Gracious Affect (typically observed): Accepting, Appropriate, Calm, Pleasant Orientation: : Oriented to Self, Oriented to Place, Oriented to  Time, Oriented to Situation Alcohol / Substance Use: Not Applicable Psych Involvement: No (comment)  Admission diagnosis:  COPD exacerbation (HCC) [J44.1] Patient Active Problem List   Diagnosis Date Noted   COPD exacerbation (HCC) 02/02/2023   Influenza A 02/02/2023   Morbid obesity (HCC) 07/02/2019   Type 2 diabetes mellitus with obesity (HCC) 07/02/2019   Hypercholesterolemia 07/02/2019   Hypertension 07/02/2019   Malignant neoplasm of lower-inner quadrant of left breast in female, estrogen receptor positive (HCC) 06/26/2019   PCP:  Jackie Plum, MD Pharmacy:   [facility] [address] ([place]), [place] - [address] [place] ([place]) [place] [address] Phone: [phone] Fax: [phone]     Social Drivers of Health (SDOH) Social History: SDOH Screenings   Food Insecurity: No Food Insecurity (04/02/2020)   Received from [facility], [facility]  Social Connections: Unknown (05/25/2021)   Received from [facility], [facility]  Tobacco Use: High Risk (02/02/2023)   SDOH Interventions:     Readmission Risk Interventions     No data to display

## 2023-02-03 NOTE — Progress Notes (Signed)
Provider messaged to see if sats could be checked on RA. Patient sleeping. Patient taken off oxygen and sats maintained at rest 93-97%.

## 2023-02-03 NOTE — Care Management Obs Status (Signed)
MEDICARE OBSERVATION STATUS NOTIFICATION   Patient Details  Name: Caitlin Rivera MRN: [id number] Date of Birth: [birth year]   Medicare Observation Status Notification Given:  Orland Dec, CMA 02/03/2023, 2:20 PM

## 2023-02-03 NOTE — ED Notes (Signed)
Pt given gram crackers and ginger ale per request

## 2023-02-03 NOTE — Progress Notes (Signed)
PROGRESS NOTE    Caitlin Rivera  ION:[id number] DOB: 09-18-1955 DOA: 02/02/2023 PCP: Jackie Plum, MD    Assessment & Plan:   Principal Problem:   COPD exacerbation [facility]) Active Problems:   Malignant neoplasm of lower-inner quadrant of left breast in female, estrogen receptor positive (HCC)   Morbid obesity (HCC)   Type 2 diabetes mellitus with obesity (HCC)   Hypercholesterolemia   Hypertension   Influenza A  Assessment and Plan: COPD exacerbation: likely secondary to influenza A. Continue on steroids, bronchodilators & encourage incentive spirometry    Influenza A: continue on tamiflu. Continue w/ supportive care. Continue on droplet precautions   HTN: continue on amlodipine    DM2: well controlled, HbA1c 6.6. Continue on SSI w/ accuchecks   Morbid obesity: BMI 37.5. Complicates overall care & prognosis    Breast cancer: continue on home dose of anastrozole      DVT prophylaxis: lovenox  Code Status: full  Family Communication:  Disposition Plan: likely d/c back home   Level of care: Telemetry Medical  Status is: Observation The patient remains OBS appropriate and will d/c before 2 midnights.    Consultants:    Procedures:   Antimicrobials:    Subjective: Pt c/o malaise   Objective: Vitals:   02/03/23 0215 02/03/23 0500 02/03/23 0600 02/03/23 0800  BP: (!) 115/55 137/63 (!) 107/55 138/75  Pulse: 72  76 78  Resp: (!) 22 20 18  (!) 23  Temp: 98.8 F (37.1 C)  98 F (36.7 C)   TempSrc: Oral  Oral   SpO2: 98%  98% 97%  Weight:      Height:       No intake or output data in the 24 hours ending 02/03/23 0910 Filed Weights   02/02/23 1133  Weight: 108.9 kg    Examination:  General exam: Appears calm and comfortable  Respiratory system: course breath sounds b/l. Intermittent wheezes b/l  Cardiovascular system: S1 & S2+. No rubs, gallops or clicks.  Gastrointestinal system: Abdomen is nondistended, soft and nontender. Normal bowel  sounds heard. Central nervous system: Alert and oriented. Moves all extremities  Psychiatry: Judgement and insight appears at baseline. Mood & affect appropriate.     Data Reviewed: I have personally reviewed following labs and imaging studies  CBC: Recent Labs  Lab 02/02/23 1135 02/03/23 0631  WBC 5.8 5.0  HGB 15.3* 14.7  HCT 48.3* 45.8  MCV 92.0 90.9  PLT 222 227   Basic Metabolic Panel: Recent Labs  Lab 02/02/23 1135 02/03/23 0631  NA 137 138  K 3.8 3.8  CL 101 103  CO2 25 26  GLUCOSE 119* 175*  BUN 9 16  CREATININE 0.69 0.72  CALCIUM 8.5* 8.8*   GFR: Estimated Creatinine Clearance: 89.1 mL/min (by C-G formula based on SCr of 0.72 mg/dL). Liver Function Tests: Recent Labs  Lab 02/02/23 1135  AST 25  ALT 17  ALKPHOS 73  BILITOT 0.7  PROT 7.1  ALBUMIN 3.7   Recent Labs  Lab 02/02/23 1135  LIPASE 29   No results for input(s): "AMMONIA" in the last 168 hours. Coagulation Profile: No results for input(s): "INR", "PROTIME" in the last 168 hours. Cardiac Enzymes: No results for input(s): "CKTOTAL", "CKMB", "CKMBINDEX", "TROPONINI" in the last 168 hours. BNP (last 3 results) No results for input(s): "PROBNP" in the last 8760 hours. HbA1C: No results for input(s): "HGBA1C" in the last 72 hours. CBG: Recent Labs  Lab 02/02/23 2125 02/03/23 0804  GLUCAP 330* 103*  Lipid Profile: No results for input(s): "CHOL", "HDL", "LDLCALC", "TRIG", "CHOLHDL", "LDLDIRECT" in the last 72 hours. Thyroid Function Tests: No results for input(s): "TSH", "T4TOTAL", "FREET4", "T3FREE", "THYROIDAB" in the last 72 hours. Anemia Panel: No results for input(s): "VITAMINB12", "FOLATE", "FERRITIN", "TIBC", "IRON", "RETICCTPCT" in the last 72 hours. Sepsis Labs: Recent Labs  Lab 02/02/23 2128  PROCALCITON <0.10    Recent Results (from the past 240 hours)  Resp panel by RT-PCR (RSV, Flu A&B, Covid) Anterior Nasal Swab     Status: Abnormal   Collection Time: 02/02/23  11:35 AM   Specimen: Anterior Nasal Swab  Result Value Ref Range Status   SARS Coronavirus 2 by RT PCR NEGATIVE NEGATIVE Final    Comment: (NOTE) SARS-CoV-2 target nucleic acids are NOT DETECTED.  The SARS-CoV-2 RNA is generally detectable in upper respiratory specimens during the acute phase of infection. The lowest concentration of SARS-CoV-2 viral copies this assay can detect is 138 copies/mL. A negative result does not preclude SARS-Cov-2 infection and should not be used as the sole basis for treatment or other patient management decisions. A negative result may occur with  improper specimen collection/handling, submission of specimen other than nasopharyngeal swab, presence of viral mutation(s) within the areas targeted by this assay, and inadequate number of viral copies(<138 copies/mL). A negative result must be combined with clinical observations, patient history, and epidemiological information. The expected result is Negative.  Fact Sheet for Patients:  BloggerCourse.com  Fact Sheet for Healthcare Providers:  SeriousBroker.it  This test is no t yet approved or cleared by the [place] FDA and  has been authorized for detection and/or diagnosis of SARS-CoV-2 by FDA under an Emergency Use Authorization (EUA). This EUA will remain  in effect (meaning this test can be used) for the duration of the COVID-19 declaration under Section 564(b)(1) of the Act, 21 U.S.C.section 360bbb-3(b)(1), unless the authorization is terminated  or revoked sooner.       Influenza A by PCR POSITIVE (A) NEGATIVE Final   Influenza B by PCR NEGATIVE NEGATIVE Final    Comment: (NOTE) The Xpert Xpress SARS-CoV-2/FLU/RSV plus assay is intended as an aid in the diagnosis of influenza from Nasopharyngeal swab specimens and should not be used as a sole basis for treatment. Nasal washings and aspirates are unacceptable for Xpert Xpress  SARS-CoV-2/FLU/RSV testing.  Fact Sheet for Patients: BloggerCourse.com  Fact Sheet for Healthcare Providers: SeriousBroker.it  This test is not yet approved or cleared by the [place] FDA and has been authorized for detection and/or diagnosis of SARS-CoV-2 by FDA under an Emergency Use Authorization (EUA). This EUA will remain in effect (meaning this test can be used) for the duration of the COVID-19 declaration under Section 564(b)(1) of the Act, 21 U.S.C. section 360bbb-3(b)(1), unless the authorization is terminated or revoked.     Resp Syncytial Virus by PCR NEGATIVE NEGATIVE Final    Comment: (NOTE) Fact Sheet for Patients: BloggerCourse.com  Fact Sheet for Healthcare Providers: SeriousBroker.it  This test is not yet approved or cleared by the [place] FDA and has been authorized for detection and/or diagnosis of SARS-CoV-2 by FDA under an Emergency Use Authorization (EUA). This EUA will remain in effect (meaning this test can be used) for the duration of the COVID-19 declaration under Section 564(b)(1) of the Act, 21 U.S.C. section 360bbb-3(b)(1), unless the authorization is terminated or revoked.  Performed at [facility], [address] [place], [place] [address]  Radiology Studies: DG Chest 2 View Result Date: 02/02/2023 CLINICAL DATA:  68 year old female with shortness of breath, cough, left side pain. Smoker. EXAM: CHEST - 2 VIEW COMPARISON:  Chest radiographs 02/25/2020 and earlier. FINDINGS: AP and lateral views 1145 hours. Coarse chronic bilateral pulmonary interstitial opacity does not appear significantly changed since 2017, probably smoking related. Larger lung volumes since that time. Normal cardiac size and mediastinal contours. Visualized tracheal air column is within normal limits. No pneumothorax, pulmonary edema, pleural  effusion or acute pulmonary opacity. No acute osseous abnormality identified. Negative visible bowel gas. IMPRESSION: No acute cardiopulmonary abnormality. Chronic probably smoking related interstitial lung changes are suspected. Electronically Signed   By: Odessa Fleming M.D.   On: 02/02/2023 12:30        Scheduled Meds:  amLODipine  5 mg Oral Daily   anastrozole  1 mg Oral Daily   enoxaparin (LOVENOX) injection  0.5 mg/kg Subcutaneous Q24H   gabapentin  400 mg Oral TID   insulin aspart  0-15 Units Subcutaneous TID WC   insulin aspart  0-5 Units Subcutaneous QHS   lidocaine  1 patch Transdermal Q24H   mometasone-formoterol  2 puff Inhalation BID   oseltamivir  75 mg Oral BID   predniSONE  40 mg Oral Q breakfast   rosuvastatin  20 mg Oral QHS   Continuous Infusions:   LOS: 0 days     Charise Killian, MD [facility] Pager [phone]  If 7PM-7AM, please contact night-coverage [email or web address] 02/03/2023, 9:10 AM

## 2023-02-04 DIAGNOSIS — J441 Chronic obstructive pulmonary disease with (acute) exacerbation: Secondary | ICD-10-CM | POA: Diagnosis not present

## 2023-02-04 LAB — BASIC METABOLIC PANEL
Anion gap: 11 (ref 5–15)
BUN: 21 mg/dL (ref 8–23)
CO2: 27 mmol/L (ref 22–32)
Calcium: 8.7 mg/dL — ABNORMAL LOW (ref 8.9–10.3)
Chloride: 104 mmol/L (ref 98–111)
Creatinine, Ser: 0.75 mg/dL (ref 0.44–1.00)
GFR, Estimated: >60 mL/min (ref >60)
Glucose, Bld: 104 mg/dL — ABNORMAL HIGH (ref 70–99)
Potassium: 4.1 mmol/L (ref 3.5–5.1)
Sodium: 142 mmol/L (ref 135–145)

## 2023-02-04 LAB — GLUCOSE, CAPILLARY
Glucose-Capillary: 224 mg/dL — ABNORMAL HIGH (ref 70–99)
Glucose-Capillary: 160 mg/dL — ABNORMAL HIGH (ref 70–99)
Glucose-Capillary: 139 mg/dL — ABNORMAL HIGH (ref 70–99)
Glucose-Capillary: 241 mg/dL — ABNORMAL HIGH (ref 70–99)
Glucose-Capillary: 237 mg/dL — ABNORMAL HIGH (ref 70–99)

## 2023-02-04 MED ORDER — OXYCODONE-ACETAMINOPHEN 5-325 MG PO TABS
1.0000 | ORAL_TABLET | Freq: Four times a day (QID) | ORAL | Status: DC | PRN
  Administered 2023-02-04: 2 via ORAL
  Administered 2023-02-04: 1 via ORAL
  Administered 2023-02-05: 2 via ORAL
  Administered 2023-02-05: 1 via ORAL
  Filled 2023-02-04 (×2): qty 1
  Filled 2023-02-04 (×2): qty 2

## 2023-02-04 MED ORDER — HYDROXYZINE HCL 25 MG PO TABS
50.0000 mg | ORAL_TABLET | Freq: Every evening | ORAL | Status: DC | PRN
  Administered 2023-02-04: 50 mg via ORAL
  Filled 2023-02-04: qty 2

## 2023-02-04 MED ORDER — PRAZOSIN HCL 1 MG PO CAPS
1.0000 mg | ORAL_CAPSULE | Freq: Every day | ORAL | Status: DC
  Administered 2023-02-04 – 2023-02-05 (×2): 1 mg via ORAL
  Filled 2023-02-04 (×2): qty 1

## 2023-02-04 MED ORDER — LIDOCAINE 5 % EX PTCH
1.0000 | MEDICATED_PATCH | CUTANEOUS | Status: DC
  Administered 2023-02-04 – 2023-02-06 (×3): 1 via TRANSDERMAL
  Filled 2023-02-04 (×3): qty 1

## 2023-02-04 MED ORDER — DULOXETINE HCL 20 MG PO CPEP
20.0000 mg | ORAL_CAPSULE | Freq: Two times a day (BID) | ORAL | Status: DC
  Administered 2023-02-04 – 2023-02-06 (×3): 20 mg via ORAL
  Filled 2023-02-04 (×6): qty 1

## 2023-02-04 MED ORDER — GUAIFENESIN-DM 100-10 MG/5ML PO SYRP
5.0000 mL | ORAL_SOLUTION | ORAL | Status: DC | PRN
  Administered 2023-02-05: 5 mL via ORAL
  Filled 2023-02-04 (×2): qty 10

## 2023-02-04 MED ORDER — GUAIFENESIN ER 600 MG PO TB12
600.0000 mg | ORAL_TABLET | Freq: Two times a day (BID) | ORAL | Status: DC
  Administered 2023-02-04 – 2023-02-06 (×5): 600 mg via ORAL
  Filled 2023-02-04 (×5): qty 1

## 2023-02-04 MED ORDER — TIZANIDINE HCL 4 MG PO TABS
4.0000 mg | ORAL_TABLET | Freq: Three times a day (TID) | ORAL | Status: DC | PRN
  Administered 2023-02-04 – 2023-02-05 (×2): 4 mg via ORAL
  Filled 2023-02-04 (×5): qty 1

## 2023-02-04 NOTE — Plan of Care (Signed)
Problem: Health Behavior/Discharge Planning: Goal: Ability to manage health-related needs will improve Outcome: Progressing

## 2023-02-04 NOTE — Progress Notes (Signed)
PROGRESS NOTE    Caitlin Rivera  UJW:[id number] DOB: [birth year] DOA: 02/02/2023 PCP: Jackie Plum, MD    Assessment & Plan:   Principal Problem:   COPD exacerbation [facility]) Active Problems:   Malignant neoplasm of lower-inner quadrant of left breast in female, estrogen receptor positive (HCC)   Morbid obesity (HCC)   Type 2 diabetes mellitus with obesity (HCC)   Hypercholesterolemia   Hypertension   Influenza A  Assessment and Plan: COPD exacerbation: likely secondary to influenza A. Continue on steroids, bronchodilators & encourage incentive spirometry. Flutter valve ordered    Influenza A: continue on tamiflu. Continue w/ supportive care. Droplet precautions   HTN: continue on amlodipine    DM2: well controlled, HbA1c 6.6. Continue on SSI w/ accuchecks   Morbid obesity: BMI 37.5. Complicates overall care & prognosis    Breast cancer: continue on home dose of anastrozole    Depression: severity unknown. Continue on home dose of duloxetine    DVT prophylaxis: lovenox  Code Status: full  Family Communication:  Disposition Plan: likely d/c back home   Level of care: Telemetry Medical  Status is: Observation The patient remains OBS appropriate and will d/c before 2 midnights.    Consultants:    Procedures:   Antimicrobials:    Subjective: Pt c/o productive cough   Objective: Vitals:   02/03/23 2000 02/03/23 2100 02/03/23 2114 02/04/23 0320  BP: (!) 146/74  130/71 (!) 107/58  Pulse: 92  72 66  Resp: 18  16 12   Temp: 98.6 F (37 C)  98.2 F (36.8 C) 98 F (36.7 C)  TempSrc: Oral  Oral Oral  SpO2: 91%  92% 96%  Weight:  107.6 kg    Height:  5\' 7"  (1.702 m)     No intake or output data in the 24 hours ending 02/04/23 0833 Filed Weights   02/02/23 1133 02/03/23 2100  Weight: 108.9 kg 107.6 kg    Examination:  General exam: Appears uncomfortable  Respiratory system: course breath sounds b/l. Wheezes b/l  Cardiovascular system: S1/S2+.  No rubs or clicks  Gastrointestinal system: Abd is soft, NT, obese & hypoactive bowel sounds. Central nervous system: alert & oriented. Moves all extremities Psychiatry: Judgement and insight appears at baseline. Flat mood and affect      Data Reviewed: I have personally reviewed following labs and imaging studies  CBC: Recent Labs  Lab 02/02/23 1135 02/03/23 0631  WBC 5.8 5.0  HGB 15.3* 14.7  HCT 48.3* 45.8  MCV 92.0 90.9  PLT 222 227   Basic Metabolic Panel: Recent Labs  Lab 02/02/23 1135 02/03/23 0631 02/04/23 0709  NA 137 138 142  K 3.8 3.8 4.1  CL 101 103 104  CO2 25 26 27   GLUCOSE 119* 175* 104*  BUN 9 16 21   CREATININE 0.69 0.72 0.75  CALCIUM 8.5* 8.8* 8.7*   GFR: Estimated Creatinine Clearance: 88.5 mL/min (by C-G formula based on SCr of 0.75 mg/dL). Liver Function Tests: Recent Labs  Lab 02/02/23 1135  AST 25  ALT 17  ALKPHOS 73  BILITOT 0.7  PROT 7.1  ALBUMIN 3.7   Recent Labs  Lab 02/02/23 1135  LIPASE 29   No results for input(s): "AMMONIA" in the last 168 hours. Coagulation Profile: No results for input(s): "INR", "PROTIME" in the last 168 hours. Cardiac Enzymes: No results for input(s): "CKTOTAL", "CKMB", "CKMBINDEX", "TROPONINI" in the last 168 hours. BNP (last 3 results) No results for input(s): "PROBNP" in the last 8760 hours. HbA1C:  Recent Labs    02/03/23 0631  HGBA1C 6.6*   CBG: Recent Labs  Lab 02/02/23 2125 02/03/23 0804 02/03/23 1145 02/03/23 1648 02/03/23 2119  GLUCAP 330* 103* 172* 177* 224*   Lipid Profile: No results for input(s): "CHOL", "HDL", "LDLCALC", "TRIG", "CHOLHDL", "LDLDIRECT" in the last 72 hours. Thyroid Function Tests: No results for input(s): "TSH", "T4TOTAL", "FREET4", "T3FREE", "THYROIDAB" in the last 72 hours. Anemia Panel: No results for input(s): "VITAMINB12", "FOLATE", "FERRITIN", "TIBC", "IRON", "RETICCTPCT" in the last 72 hours. Sepsis Labs: Recent Labs  Lab 02/02/23 2128   PROCALCITON <0.10    Recent Results (from the past 240 hours)  Resp panel by RT-PCR (RSV, Flu A&B, Covid) Anterior Nasal Swab     Status: Abnormal   Collection Time: 02/02/23 11:35 AM   Specimen: Anterior Nasal Swab  Result Value Ref Range Status   SARS Coronavirus 2 by RT PCR NEGATIVE NEGATIVE Final    Comment: (NOTE) SARS-CoV-2 target nucleic acids are NOT DETECTED.  The SARS-CoV-2 RNA is generally detectable in upper respiratory specimens during the acute phase of infection. The lowest concentration of SARS-CoV-2 viral copies this assay can detect is 138 copies/mL. A negative result does not preclude SARS-Cov-2 infection and should not be used as the sole basis for treatment or other patient management decisions. A negative result may occur with  improper specimen collection/handling, submission of specimen other than nasopharyngeal swab, presence of viral mutation(s) within the areas targeted by this assay, and inadequate number of viral copies(<138 copies/mL). A negative result must be combined with clinical observations, patient history, and epidemiological information. The expected result is Negative.  Fact Sheet for Patients:  BloggerCourse.com  Fact Sheet for Healthcare Providers:  SeriousBroker.it  This test is no t yet approved or cleared by the [place] FDA and  has been authorized for detection and/or diagnosis of SARS-CoV-2 by FDA under an Emergency Use Authorization (EUA). This EUA will remain  in effect (meaning this test can be used) for the duration of the COVID-19 declaration under Section 564(b)(1) of the Act, 21 U.S.C.section 360bbb-3(b)(1), unless the authorization is terminated  or revoked sooner.       Influenza A by PCR POSITIVE (A) NEGATIVE Final   Influenza B by PCR NEGATIVE NEGATIVE Final    Comment: (NOTE) The Xpert Xpress SARS-CoV-2/FLU/RSV plus assay is intended as an aid in the  diagnosis of influenza from Nasopharyngeal swab specimens and should not be used as a sole basis for treatment. Nasal washings and aspirates are unacceptable for Xpert Xpress SARS-CoV-2/FLU/RSV testing.  Fact Sheet for Patients: BloggerCourse.com  Fact Sheet for Healthcare Providers: SeriousBroker.it  This test is not yet approved or cleared by the [place] FDA and has been authorized for detection and/or diagnosis of SARS-CoV-2 by FDA under an Emergency Use Authorization (EUA). This EUA will remain in effect (meaning this test can be used) for the duration of the COVID-19 declaration under Section 564(b)(1) of the Act, 21 U.S.C. section 360bbb-3(b)(1), unless the authorization is terminated or revoked.     Resp Syncytial Virus by PCR NEGATIVE NEGATIVE Final    Comment: (NOTE) Fact Sheet for Patients: BloggerCourse.com  Fact Sheet for Healthcare Providers: SeriousBroker.it  This test is not yet approved or cleared by the [place] FDA and has been authorized for detection and/or diagnosis of SARS-CoV-2 by FDA under an Emergency Use Authorization (EUA). This EUA will remain in effect (meaning this test can be used) for the duration of the COVID-19 declaration under Section 564(b)(1)  of the Act, 21 U.S.C. section 360bbb-3(b)(1), unless the authorization is terminated or revoked.  Performed at [facility], [address] [place], [place] [address]          Radiology Studies: DG Humerus Right Result Date: 02/03/2023 CLINICAL DATA:  Right arm pain EXAM: RIGHT HUMERUS - 2 VIEW COMPARISON:  None Available. FINDINGS: Osteopenia.  No fracture or dislocation.  Preserved joint spaces. IMPRESSION: Osteopenia.  No acute osseous abnormality. Electronically Signed   By: Karen Kays M.D.   On: 02/03/2023 10:34   DG Chest 2 View Result Date: 02/02/2023 CLINICAL  DATA:  68 year old female with shortness of breath, cough, left side pain. Smoker. EXAM: CHEST - 2 VIEW COMPARISON:  Chest radiographs 02/25/2020 and earlier. FINDINGS: AP and lateral views 1145 hours. Coarse chronic bilateral pulmonary interstitial opacity does not appear significantly changed since 2017, probably smoking related. Larger lung volumes since that time. Normal cardiac size and mediastinal contours. Visualized tracheal air column is within normal limits. No pneumothorax, pulmonary edema, pleural effusion or acute pulmonary opacity. No acute osseous abnormality identified. Negative visible bowel gas. IMPRESSION: No acute cardiopulmonary abnormality. Chronic probably smoking related interstitial lung changes are suspected. Electronically Signed   By: Odessa Fleming M.D.   On: 02/02/2023 12:30        Scheduled Meds:  amLODipine  5 mg Oral Daily   anastrozole  1 mg Oral Daily   DULoxetine  20 mg Oral BID   enoxaparin (LOVENOX) injection  0.5 mg/kg Subcutaneous Q24H   gabapentin  400 mg Oral TID   influenza vaccine adjuvanted  0.5 mL Intramuscular Tomorrow-1000   insulin aspart  0-15 Units Subcutaneous TID WC   insulin aspart  0-5 Units Subcutaneous QHS   lidocaine  1 patch Transdermal Q24H   mometasone-formoterol  2 puff Inhalation BID   oseltamivir  75 mg Oral BID   prazosin  1 mg Oral QHS   predniSONE  40 mg Oral Q breakfast   rosuvastatin  20 mg Oral QHS   Continuous Infusions:   LOS: 0 days     Charise Killian, MD [facility] Pager [phone]  If 7PM-7AM, please contact night-coverage [email or web address] 02/04/2023, 8:33 AM

## 2023-02-05 DIAGNOSIS — J441 Chronic obstructive pulmonary disease with (acute) exacerbation: Secondary | ICD-10-CM | POA: Diagnosis not present

## 2023-02-05 LAB — GLUCOSE, CAPILLARY
Glucose-Capillary: 141 mg/dL — ABNORMAL HIGH (ref 70–99)
Glucose-Capillary: 109 mg/dL — ABNORMAL HIGH (ref 70–99)
Glucose-Capillary: 266 mg/dL — ABNORMAL HIGH (ref 70–99)
Glucose-Capillary: 310 mg/dL — ABNORMAL HIGH (ref 70–99)

## 2023-02-05 LAB — BASIC METABOLIC PANEL
Anion gap: 9 (ref 5–15)
BUN: 23 mg/dL (ref 8–23)
CO2: 28 mmol/L (ref 22–32)
Calcium: 8.4 mg/dL — ABNORMAL LOW (ref 8.9–10.3)
Chloride: 103 mmol/L (ref 98–111)
Creatinine, Ser: 0.66 mg/dL (ref 0.44–1.00)
GFR, Estimated: >60 mL/min (ref >60)
Glucose, Bld: 97 mg/dL (ref 70–99)
Potassium: 4.2 mmol/L (ref 3.5–5.1)
Sodium: 140 mmol/L (ref 135–145)

## 2023-02-05 MED ORDER — PNEUMOCOCCAL 20-VAL CONJ VACC 0.5 ML IM SUSY
0.5000 mL | PREFILLED_SYRINGE | INTRAMUSCULAR | Status: AC
  Administered 2023-02-06: 0.5 mL via INTRAMUSCULAR
  Filled 2023-02-05: qty 0.5

## 2023-02-05 NOTE — Progress Notes (Signed)
PROGRESS NOTE    Caitlin Rivera  ZOX:[id number] DOB: [birth year] DOA: 02/02/2023 PCP: Jackie Plum, MD    Assessment & Plan:   Principal Problem:   COPD exacerbation [facility]) Active Problems:   Malignant neoplasm of lower-inner quadrant of left breast in female, estrogen receptor positive (HCC)   Morbid obesity (HCC)   Type 2 diabetes mellitus with obesity (HCC)   Hypercholesterolemia   Hypertension   Influenza A  Assessment and Plan: COPD exacerbation: likely secondary to influenza A. Continue on steroids, bronchodilators & encourage incentive spirometry Encourage flutter valve    Influenza A: completed tamiflu course. Continue w/ supportive care. Droplet precautions   HTN: continue on CCB    DM2: well controlled, HbA1c 6.6. Continue on SSI w/ accuchecks   Morbid obesity: BMI 37.5. Complicates overall care & prognosis    Breast cancer: continue on home dose of anastrozole   Depression: severity unknown. Continue on home dose of duloxetine    DVT prophylaxis: lovenox  Code Status: full  Family Communication:  Disposition Plan: likely d/c back home   Level of care: Telemetry Medical  Status is: Observation The patient remains OBS appropriate and will d/c before 2 midnights.    Consultants:    Procedures:   Antimicrobials:    Subjective: Pt c/o congestion.   Objective: Vitals:   02/04/23 1400 02/04/23 1503 02/04/23 2127 02/05/23 0452  BP:  (!) 105/58 138/73 118/70  Pulse:  70 70 60  Resp:  18 18 20   Temp:  98.2 F (36.8 C) 97.7 F (36.5 C) 98.1 F (36.7 C)  TempSrc:   Oral Oral  SpO2: 94% 93% 94% 93%  Weight:      Height:        Intake/Output Summary (Last 24 hours) at 02/05/2023 0839 Last data filed at 02/04/2023 1859 Gross per 24 hour  Intake 480 ml  Output --  Net 480 ml   Filed Weights   02/02/23 1133 02/03/23 2100  Weight: 108.9 kg 107.6 kg    Examination:  General exam: appears calm but uncomfortable Respiratory system:  decreased breath sounds b/l. No wheezes  Cardiovascular system: S1 & S2+. No rubs or clicks   Gastrointestinal system: Abd is soft, NT, obese & hypoactive bowel sounds Central nervous system: alert & oriented. Moves all extremities  Psychiatry: Judgement and insight appears at baseline. Flat mood and affect    Data Reviewed: I have personally reviewed following labs and imaging studies  CBC: Recent Labs  Lab 02/02/23 1135 02/03/23 0631  WBC 5.8 5.0  HGB 15.3* 14.7  HCT 48.3* 45.8  MCV 92.0 90.9  PLT 222 227   Basic Metabolic Panel: Recent Labs  Lab 02/02/23 1135 02/03/23 0631 02/04/23 0709 02/05/23 0531  NA 137 138 142 140  K 3.8 3.8 4.1 4.2  CL 101 103 104 103  CO2 25 26 27 28   GLUCOSE 119* 175* 104* 97  BUN 9 16 21 23   CREATININE 0.69 0.72 0.75 0.66  CALCIUM 8.5* 8.8* 8.7* 8.4*   GFR: Estimated Creatinine Clearance: 88.5 mL/min (by C-G formula based on SCr of 0.66 mg/dL). Liver Function Tests: Recent Labs  Lab 02/02/23 1135  AST 25  ALT 17  ALKPHOS 73  BILITOT 0.7  PROT 7.1  ALBUMIN 3.7   Recent Labs  Lab 02/02/23 1135  LIPASE 29   No results for input(s): "AMMONIA" in the last 168 hours. Coagulation Profile: No results for input(s): "INR", "PROTIME" in the last 168 hours. Cardiac Enzymes: No results  for input(s): "CKTOTAL", "CKMB", "CKMBINDEX", "TROPONINI" in the last 168 hours. BNP (last 3 results) No results for input(s): "PROBNP" in the last 8760 hours. HbA1C: Recent Labs    02/03/23 0631  HGBA1C 6.6*   CBG: Recent Labs  Lab 02/03/23 2119 02/04/23 0847 02/04/23 1213 02/04/23 1547 02/04/23 2130  GLUCAP 224* 160* 139* 241* 237*   Lipid Profile: No results for input(s): "CHOL", "HDL", "LDLCALC", "TRIG", "CHOLHDL", "LDLDIRECT" in the last 72 hours. Thyroid Function Tests: No results for input(s): "TSH", "T4TOTAL", "FREET4", "T3FREE", "THYROIDAB" in the last 72 hours. Anemia Panel: No results for input(s): "VITAMINB12", "FOLATE",  "FERRITIN", "TIBC", "IRON", "RETICCTPCT" in the last 72 hours. Sepsis Labs: Recent Labs  Lab 02/02/23 2128  PROCALCITON <0.10    Recent Results (from the past 240 hours)  Resp panel by RT-PCR (RSV, Flu A&B, Covid) Anterior Nasal Swab     Status: Abnormal   Collection Time: 02/02/23 11:35 AM   Specimen: Anterior Nasal Swab  Result Value Ref Range Status   SARS Coronavirus 2 by RT PCR NEGATIVE NEGATIVE Final    Comment: (NOTE) SARS-CoV-2 target nucleic acids are NOT DETECTED.  The SARS-CoV-2 RNA is generally detectable in upper respiratory specimens during the acute phase of infection. The lowest concentration of SARS-CoV-2 viral copies this assay can detect is 138 copies/mL. A negative result does not preclude SARS-Cov-2 infection and should not be used as the sole basis for treatment or other patient management decisions. A negative result may occur with  improper specimen collection/handling, submission of specimen other than nasopharyngeal swab, presence of viral mutation(s) within the areas targeted by this assay, and inadequate number of viral copies(<138 copies/mL). A negative result must be combined with clinical observations, patient history, and epidemiological information. The expected result is Negative.  Fact Sheet for Patients:  BloggerCourse.com  Fact Sheet for Healthcare Providers:  SeriousBroker.it  This test is no t yet approved or cleared by the [place] FDA and  has been authorized for detection and/or diagnosis of SARS-CoV-2 by FDA under an Emergency Use Authorization (EUA). This EUA will remain  in effect (meaning this test can be used) for the duration of the COVID-19 declaration under Section 564(b)(1) of the Act, 21 U.S.C.section 360bbb-3(b)(1), unless the authorization is terminated  or revoked sooner.       Influenza A by PCR POSITIVE (A) NEGATIVE Final   Influenza B by PCR NEGATIVE NEGATIVE  Final    Comment: (NOTE) The Xpert Xpress SARS-CoV-2/FLU/RSV plus assay is intended as an aid in the diagnosis of influenza from Nasopharyngeal swab specimens and should not be used as a sole basis for treatment. Nasal washings and aspirates are unacceptable for Xpert Xpress SARS-CoV-2/FLU/RSV testing.  Fact Sheet for Patients: BloggerCourse.com  Fact Sheet for Healthcare Providers: SeriousBroker.it  This test is not yet approved or cleared by the [place] FDA and has been authorized for detection and/or diagnosis of SARS-CoV-2 by FDA under an Emergency Use Authorization (EUA). This EUA will remain in effect (meaning this test can be used) for the duration of the COVID-19 declaration under Section 564(b)(1) of the Act, 21 U.S.C. section 360bbb-3(b)(1), unless the authorization is terminated or revoked.     Resp Syncytial Virus by PCR NEGATIVE NEGATIVE Final    Comment: (NOTE) Fact Sheet for Patients: BloggerCourse.com  Fact Sheet for Healthcare Providers: SeriousBroker.it  This test is not yet approved or cleared by the [place] FDA and has been authorized for detection and/or diagnosis of SARS-CoV-2 by FDA under an  Emergency Use Authorization (EUA). This EUA will remain in effect (meaning this test can be used) for the duration of the COVID-19 declaration under Section 564(b)(1) of the Act, 21 U.S.C. section 360bbb-3(b)(1), unless the authorization is terminated or revoked.  Performed at [facility], [address], [place], [place] [address]          Radiology Studies: DG Humerus Right Result Date: 02/03/2023 CLINICAL DATA:  Right arm pain EXAM: RIGHT HUMERUS - 2 VIEW COMPARISON:  None Available. FINDINGS: Osteopenia.  No fracture or dislocation.  Preserved joint spaces. IMPRESSION: Osteopenia.  No acute osseous abnormality. Electronically Signed    By: Karen Kays M.D.   On: 02/03/2023 10:34        Scheduled Meds:  amLODipine  5 mg Oral Daily   anastrozole  1 mg Oral Daily   DULoxetine  20 mg Oral BID   enoxaparin (LOVENOX) injection  0.5 mg/kg Subcutaneous Q24H   gabapentin  400 mg Oral TID   guaiFENesin  600 mg Oral BID   influenza vaccine adjuvanted  0.5 mL Intramuscular Tomorrow-1000   insulin aspart  0-15 Units Subcutaneous TID WC   insulin aspart  0-5 Units Subcutaneous QHS   lidocaine  1 patch Transdermal Q24H   lidocaine  1 patch Transdermal Q24H   mometasone-formoterol  2 puff Inhalation BID   prazosin  1 mg Oral QHS   predniSONE  40 mg Oral Q breakfast   rosuvastatin  20 mg Oral QHS   Continuous Infusions:   LOS: 0 days     Charise Killian, MD [facility] Pager [phone]  If 7PM-7AM, please contact night-coverage [email or web address] 02/05/2023, 8:39 AM

## 2023-02-06 DIAGNOSIS — J441 Chronic obstructive pulmonary disease with (acute) exacerbation: Secondary | ICD-10-CM | POA: Diagnosis not present

## 2023-02-06 LAB — BASIC METABOLIC PANEL
Anion gap: 9 (ref 5–15)
BUN: 25 mg/dL — ABNORMAL HIGH (ref 8–23)
CO2: 31 mmol/L (ref 22–32)
Calcium: 8.5 mg/dL — ABNORMAL LOW (ref 8.9–10.3)
Chloride: 101 mmol/L (ref 98–111)
Creatinine, Ser: 0.65 mg/dL (ref 0.44–1.00)
GFR, Estimated: >60 mL/min (ref >60)
Glucose, Bld: 105 mg/dL — ABNORMAL HIGH (ref 70–99)
Potassium: 4.3 mmol/L (ref 3.5–5.1)
Sodium: 141 mmol/L (ref 135–145)

## 2023-02-06 LAB — GLUCOSE, CAPILLARY
Glucose-Capillary: 92 mg/dL (ref 70–99)
Glucose-Capillary: 112 mg/dL — ABNORMAL HIGH (ref 70–99)

## 2023-02-06 MED ORDER — METFORMIN HCL 500 MG PO TABS: 500.0000 mg | ORAL_TABLET | Freq: Every day | ORAL | 0 refills | Status: DC

## 2023-02-06 MED ORDER — GABAPENTIN 600 MG PO TABS: 600.0000 mg | ORAL_TABLET | Freq: Two times a day (BID) | ORAL | 0 refills | Status: DC

## 2023-02-06 MED ORDER — DULOXETINE HCL 20 MG PO CPEP: 20.0000 mg | ORAL_CAPSULE | Freq: Two times a day (BID) | ORAL | 0 refills | Status: DC

## 2023-02-06 MED ORDER — HYDROXYZINE PAMOATE 50 MG PO CAPS: 50.0000 mg | ORAL_CAPSULE | Freq: Every evening | ORAL | 0 refills | Status: DC | PRN

## 2023-02-06 MED ORDER — PRAZOSIN HCL 1 MG PO CAPS: 1.0000 mg | ORAL_CAPSULE | Freq: Every day | ORAL | 0 refills | Status: DC

## 2023-02-06 MED ORDER — TIZANIDINE HCL 4 MG PO TABS: 4.0000 mg | ORAL_TABLET | Freq: Three times a day (TID) | ORAL | 0 refills | Status: DC | PRN

## 2023-02-06 MED ORDER — SYMBICORT 160-4.5 MCG/ACT IN AERO: 2.0000 | INHALATION_SPRAY | Freq: Two times a day (BID) | RESPIRATORY_TRACT | 0 refills | Status: AC

## 2023-02-06 MED ORDER — AMLODIPINE BESYLATE 5 MG PO TABS: 5.0000 mg | ORAL_TABLET | Freq: Every day | ORAL | 0 refills | Status: DC

## 2023-02-06 MED ORDER — ALBUTEROL SULFATE HFA 108 (90 BASE) MCG/ACT IN AERS: 1.0000 | INHALATION_SPRAY | Freq: Four times a day (QID) | RESPIRATORY_TRACT | 0 refills | Status: DC | PRN

## 2023-02-06 MED ORDER — ROSUVASTATIN CALCIUM 20 MG PO TABS: 20.0000 mg | ORAL_TABLET | Freq: Every day | ORAL | 0 refills | Status: DC

## 2023-02-06 MED ORDER — ANASTROZOLE 1 MG PO TABS: 1.0000 mg | ORAL_TABLET | Freq: Every day | ORAL | 0 refills | Status: DC

## 2023-02-06 MED ORDER — GUAIFENESIN-DM 100-10 MG/5ML PO SYRP: 5.0000 mL | ORAL_SOLUTION | ORAL | 0 refills | Status: DC | PRN

## 2023-02-06 NOTE — Discharge Summary (Addendum)
Physician Discharge Summary  Caitlin Rivera ZOX:[id number] DOB: [birth year] DOA: 02/02/2023  PCP: Jackie Plum, MD  Admit date: 02/02/2023 Discharge date: 02/06/2023  Admitted From: home  Disposition: home   Recommendations for Outpatient Follow-up:  Follow up with PCP in 1-2 weeks   Home Health: no  Equipment/Devices:  Discharge Condition: stable  CODE STATUS: full Diet recommendation: Heart Healthy / Carb Modified   Brief/Interim Summary: HPI was taken from Dr. Sedalia Muta: [name] is a 68 year old female with history of hypertension, tobacco use, COPD, morbid obesity, displaced housing, currently staying with a friend, who presents emergency department for chief concerns of shortness of breath.   Vitals in the ED showed temperature of 98.7, respiration rate 23, heart rate of 97, blood pressure 161/76, SpO2 of 90% on room air.   Serum sodium is 137, potassium 3.3, chloride 101, bicarb 25, BUN of 9, serum creatinine 0.69, EGFR greater than 60, nonfasting blood glucose 119, WBC 5.8, hemoglobin 15.3, platelets of 222.   High sensitive troponin was 10.  Patient tested positive for influenza A.  Influenza B/RSV/COVID PCR were negative.   ED treatment: Acetaminophen 1000 mg p.o. one-time dose. ------------------------------- At bedside, patient able to tell me her first and last name, age, location, current calendar year.   She reports she has been short of breath for about 1 day.  She reports she has been having a dry nonproductive cough.  She denies known sick contacts.   She reports that she smokes 1 pack of cigarettes per day and 2 days ago she used crack cocaine.  Discharge Diagnoses:  Principal Problem:   COPD exacerbation (HCC) Active Problems:   Malignant neoplasm of lower-inner quadrant of left breast in female, estrogen receptor positive (HCC)   Morbid obesity (HCC)   Type 2 diabetes mellitus with obesity (HCC)   Hypercholesterolemia   Hypertension    Influenza A COPD exacerbation: likely secondary to influenza A. Completed steroid course. Continue on bronchodilators & encourage incentive spirometry. Encourage flutter valve    Influenza A: completed tamiflu course. Continue w/ supportive care. Droplet precautions   HTN: continue on CCB    DM2: well controlled, HbA1c 6.6. Continue on SSI w/ accuchecks   Morbid obesity: BMI 37.5. Complicates overall care & prognosis    Breast cancer: continue on home dose of anastrozole   Depression: severity unknown. Continue on home dose of duloxetine    Discharge Instructions  Discharge Instructions     Diet - low sodium heart healthy   Complete by: As directed    Discharge instructions   Complete by: As directed    F/u w/ PCP in 1-2 week.   Increase activity slowly   Complete by: As directed       Allergies as of 02/06/2023       Reactions   Morphine And Codeine Itching   Ibuprofen Itching        Medication List     TAKE these medications    acetaminophen 650 MG CR tablet Commonly known as: Tylenol 8 Hour Take 1 tablet (650 mg total) by mouth every 8 (eight) hours as needed for pain or fever.   albuterol 108 (90 Base) MCG/ACT inhaler Commonly known as: VENTOLIN HFA Inhale 1-2 puffs into the lungs every 6 (six) hours as needed for wheezing or shortness of breath. What changed:  how much to take how to take this when to take this reasons to take this   amLODipine 5 MG tablet Commonly known as: NORVASC  Take 1 tablet (5 mg total) by mouth daily.   anastrozole 1 MG tablet Commonly known as: ARIMIDEX Take 1 tablet (1 mg total) by mouth daily.   DULoxetine 20 MG capsule Commonly known as: CYMBALTA Take 1 capsule (20 mg total) by mouth 2 (two) times daily.   gabapentin 600 MG tablet Commonly known as: NEURONTIN Take 1 tablet (600 mg total) by mouth 2 (two) times daily.   guaiFENesin-dextromethorphan 100-10 MG/5ML syrup Commonly known as: ROBITUSSIN DM Take 5 mLs  by mouth every 4 (four) hours as needed for up to 14 days for cough.   hydrOXYzine 50 MG capsule Commonly known as: VISTARIL Take 1 capsule (50 mg total) by mouth at bedtime as needed.   metFORMIN 500 MG tablet Commonly known as: GLUCOPHAGE Take 1 tablet (500 mg total) by mouth daily with breakfast.   OVER THE COUNTER MEDICATION Take 1-2 tablets by mouth 2 (two) times daily as needed (for pain). OTC Pain Reliever PM   prazosin 1 MG capsule Commonly known as: MINIPRESS Take 1 capsule (1 mg total) by mouth at bedtime.   rosuvastatin 20 MG tablet Commonly known as: CRESTOR Take 1 tablet (20 mg total) by mouth daily.   Symbicort 160-4.5 MCG/ACT inhaler Generic drug: budesonide-formoterol Inhale 2 puffs into the lungs in the morning and at bedtime. What changed:  how much to take how to take this when to take this   tiZANidine 4 MG tablet Commonly known as: ZANAFLEX Take 1 tablet (4 mg total) by mouth every 8 (eight) hours as needed for up to 14 days.        Follow-up Information     Jackie Plum, MD Follow up on 02/13/2023.   Specialty: Internal Medicine Why: F/u w/ PCP in 1-2 weeks.Go at 9:45am. Contact information: [address] [place] [address] [phone]                Allergies  Allergen Reactions   Morphine And Codeine Itching   Ibuprofen Itching    Consultations:    Procedures/Studies: DG Humerus Right Result Date: 02/03/2023 CLINICAL DATA:  Right arm pain EXAM: RIGHT HUMERUS - 2 VIEW COMPARISON:  None Available. FINDINGS: Osteopenia.  No fracture or dislocation.  Preserved joint spaces. IMPRESSION: Osteopenia.  No acute osseous abnormality. Electronically Signed   By: Karen Kays M.D.   On: 02/03/2023 10:34   DG Chest 2 View Result Date: 02/02/2023 CLINICAL DATA:  68 year old female with shortness of breath, cough, left side pain. Smoker. EXAM: CHEST - 2 VIEW COMPARISON:  Chest radiographs 02/25/2020 and earlier.  FINDINGS: AP and lateral views 1145 hours. Coarse chronic bilateral pulmonary interstitial opacity does not appear significantly changed since 2017, probably smoking related. Larger lung volumes since that time. Normal cardiac size and mediastinal contours. Visualized tracheal air column is within normal limits. No pneumothorax, pulmonary edema, pleural effusion or acute pulmonary opacity. No acute osseous abnormality identified. Negative visible bowel gas. IMPRESSION: No acute cardiopulmonary abnormality. Chronic probably smoking related interstitial lung changes are suspected. Electronically Signed   By: Odessa Fleming M.D.   On: 02/02/2023 12:30   (Echo, Carotid, EGD, Colonoscopy, ERCP)    Subjective: Pt c/o fatigue    Discharge Exam: Vitals:   02/05/23 1900 02/06/23 0935  BP: 139/65 129/63  Pulse: 72 78  Resp: 19 18  Temp: 97.7 F (36.5 C) 98.4 F (36.9 C)  SpO2: 94% 91%   Vitals:   02/05/23 0452 02/05/23 1543 02/05/23 1900 02/06/23 0935  BP: 118/70 (!) 135/59 139/65 129/63  Pulse: 60 76 72 78  Resp: 20 18 19 18   Temp: 98.1 F (36.7 C) 98 F (36.7 C) 97.7 F (36.5 C) 98.4 F (36.9 C)  TempSrc: Oral Oral Oral Oral  SpO2: 93% 93% 94% 91%  Weight:      Height:        General: Pt is alert, awake, not in acute distress Cardiovascular:  S1/S2 +, no rubs, no gallops Respiratory: decreased breath sounds b/l  Abdominal: Soft, NT, obese, bowel sounds + Extremities: no cyanosis    The results of significant diagnostics from this hospitalization (including imaging, microbiology, ancillary and laboratory) are listed below for reference.     Microbiology: Recent Results (from the past 240 hours)  Resp panel by RT-PCR (RSV, Flu A&B, Covid) Anterior Nasal Swab     Status: Abnormal   Collection Time: 02/02/23 11:35 AM   Specimen: Anterior Nasal Swab  Result Value Ref Range Status   SARS Coronavirus 2 by RT PCR NEGATIVE NEGATIVE Final    Comment: (NOTE) SARS-CoV-2 target nucleic  acids are NOT DETECTED.  The SARS-CoV-2 RNA is generally detectable in upper respiratory specimens during the acute phase of infection. The lowest concentration of SARS-CoV-2 viral copies this assay can detect is 138 copies/mL. A negative result does not preclude SARS-Cov-2 infection and should not be used as the sole basis for treatment or other patient management decisions. A negative result may occur with  improper specimen collection/handling, submission of specimen other than nasopharyngeal swab, presence of viral mutation(s) within the areas targeted by this assay, and inadequate number of viral copies(<138 copies/mL). A negative result must be combined with clinical observations, patient history, and epidemiological information. The expected result is Negative.  Fact Sheet for Patients:  BloggerCourse.com  Fact Sheet for Healthcare Providers:  SeriousBroker.it  This test is no t yet approved or cleared by the [place] FDA and  has been authorized for detection and/or diagnosis of SARS-CoV-2 by FDA under an Emergency Use Authorization (EUA). This EUA will remain  in effect (meaning this test can be used) for the duration of the COVID-19 declaration under Section 564(b)(1) of the Act, 21 U.S.C.section 360bbb-3(b)(1), unless the authorization is terminated  or revoked sooner.       Influenza A by PCR POSITIVE (A) NEGATIVE Final   Influenza B by PCR NEGATIVE NEGATIVE Final    Comment: (NOTE) The Xpert Xpress SARS-CoV-2/FLU/RSV plus assay is intended as an aid in the diagnosis of influenza from Nasopharyngeal swab specimens and should not be used as a sole basis for treatment. Nasal washings and aspirates are unacceptable for Xpert Xpress SARS-CoV-2/FLU/RSV testing.  Fact Sheet for Patients: BloggerCourse.com  Fact Sheet for Healthcare  Providers: SeriousBroker.it  This test is not yet approved or cleared by the [place] FDA and has been authorized for detection and/or diagnosis of SARS-CoV-2 by FDA under an Emergency Use Authorization (EUA). This EUA will remain in effect (meaning this test can be used) for the duration of the COVID-19 declaration under Section 564(b)(1) of the Act, 21 U.S.C. section 360bbb-3(b)(1), unless the authorization is terminated or revoked.     Resp Syncytial Virus by PCR NEGATIVE NEGATIVE Final    Comment: (NOTE) Fact Sheet for Patients: BloggerCourse.com  Fact Sheet for Healthcare Providers: SeriousBroker.it  This test is not yet approved or cleared by the [place] FDA and has been authorized for detection and/or diagnosis of SARS-CoV-2 by FDA under an Emergency Use Authorization (EUA).  This EUA will remain in effect (meaning this test can be used) for the duration of the COVID-19 declaration under Section 564(b)(1) of the Act, 21 U.S.C. section 360bbb-3(b)(1), unless the authorization is terminated or revoked.  Performed at [facility], [address] [place], [place] [address]      Labs: BNP (last 3 results) No results for input(s): "BNP" in the last 8760 hours. Basic Metabolic Panel: Recent Labs  Lab 02/02/23 1135 02/03/23 0631 02/04/23 0709 02/05/23 0531 02/06/23 0406  NA 137 138 142 140 141  K 3.8 3.8 4.1 4.2 4.3  CL 101 103 104 103 101  CO2 25 26 27 28 31   GLUCOSE 119* 175* 104* 97 105*  BUN 9 16 21 23  25*  CREATININE 0.69 0.72 0.75 0.66 0.65  CALCIUM 8.5* 8.8* 8.7* 8.4* 8.5*   Liver Function Tests: Recent Labs  Lab 02/02/23 1135  AST 25  ALT 17  ALKPHOS 73  BILITOT 0.7  PROT 7.1  ALBUMIN 3.7   Recent Labs  Lab 02/02/23 1135  LIPASE 29   No results for input(s): "AMMONIA" in the last 168 hours. CBC: Recent Labs  Lab 02/02/23 1135 02/03/23 0631   WBC 5.8 5.0  HGB 15.3* 14.7  HCT 48.3* 45.8  MCV 92.0 90.9  PLT 222 227   Cardiac Enzymes: No results for input(s): "CKTOTAL", "CKMB", "CKMBINDEX", "TROPONINI" in the last 168 hours. BNP: Invalid input(s): "POCBNP" CBG: Recent Labs  Lab 02/05/23 1137 02/05/23 1634 02/05/23 2127 02/06/23 0749 02/06/23 1154  GLUCAP 109* 266* 310* 92 112*   D-Dimer No results for input(s): "DDIMER" in the last 72 hours. Hgb A1c No results for input(s): "HGBA1C" in the last 72 hours. Lipid Profile No results for input(s): "CHOL", "HDL", "LDLCALC", "TRIG", "CHOLHDL", "LDLDIRECT" in the last 72 hours. Thyroid function studies No results for input(s): "TSH", "T4TOTAL", "T3FREE", "THYROIDAB" in the last 72 hours.  Invalid input(s): "FREET3" Anemia work up No results for input(s): "VITAMINB12", "FOLATE", "FERRITIN", "TIBC", "IRON", "RETICCTPCT" in the last 72 hours. Urinalysis    Component Value Date/Time   COLORURINE YELLOW 08/21/2013 1847   APPEARANCEUR CLOUDY (A) 08/21/2013 1847   LABSPEC 1.027 08/21/2013 1847   PHURINE 5.5 08/21/2013 1847   GLUCOSEU NEGATIVE 08/21/2013 1847   HGBUR NEGATIVE 08/21/2013 1847   BILIRUBINUR NEGATIVE 08/21/2013 1847   KETONESUR 15 (A) 08/21/2013 1847   PROTEINUR NEGATIVE 08/21/2013 1847   UROBILINOGEN 1.0 08/21/2013 1847   NITRITE NEGATIVE 08/21/2013 1847   LEUKOCYTESUR SMALL (A) 08/21/2013 1847   Sepsis Labs Recent Labs  Lab 02/02/23 1135 02/03/23 0631  WBC 5.8 5.0   Microbiology Recent Results (from the past 240 hours)  Resp panel by RT-PCR (RSV, Flu A&B, Covid) Anterior Nasal Swab     Status: Abnormal   Collection Time: 02/02/23 11:35 AM   Specimen: Anterior Nasal Swab  Result Value Ref Range Status   SARS Coronavirus 2 by RT PCR NEGATIVE NEGATIVE Final    Comment: (NOTE) SARS-CoV-2 target nucleic acids are NOT DETECTED.  The SARS-CoV-2 RNA is generally detectable in upper respiratory specimens during the acute phase of infection. The  lowest concentration of SARS-CoV-2 viral copies this assay can detect is 138 copies/mL. A negative result does not preclude SARS-Cov-2 infection and should not be used as the sole basis for treatment or other patient management decisions. A negative result may occur with  improper specimen collection/handling, submission of specimen other than nasopharyngeal swab, presence of viral mutation(s) within the areas targeted by this assay, and  inadequate number of viral copies(<138 copies/mL). A negative result must be combined with clinical observations, patient history, and epidemiological information. The expected result is Negative.  Fact Sheet for Patients:  BloggerCourse.com  Fact Sheet for Healthcare Providers:  SeriousBroker.it  This test is no t yet approved or cleared by the [place] FDA and  has been authorized for detection and/or diagnosis of SARS-CoV-2 by FDA under an Emergency Use Authorization (EUA). This EUA will remain  in effect (meaning this test can be used) for the duration of the COVID-19 declaration under Section 564(b)(1) of the Act, 21 U.S.C.section 360bbb-3(b)(1), unless the authorization is terminated  or revoked sooner.       Influenza A by PCR POSITIVE (A) NEGATIVE Final   Influenza B by PCR NEGATIVE NEGATIVE Final    Comment: (NOTE) The Xpert Xpress SARS-CoV-2/FLU/RSV plus assay is intended as an aid in the diagnosis of influenza from Nasopharyngeal swab specimens and should not be used as a sole basis for treatment. Nasal washings and aspirates are unacceptable for Xpert Xpress SARS-CoV-2/FLU/RSV testing.  Fact Sheet for Patients: BloggerCourse.com  Fact Sheet for Healthcare Providers: SeriousBroker.it  This test is not yet approved or cleared by the [place] FDA and has been authorized for detection and/or diagnosis of SARS-CoV-2 by FDA  under an Emergency Use Authorization (EUA). This EUA will remain in effect (meaning this test can be used) for the duration of the COVID-19 declaration under Section 564(b)(1) of the Act, 21 U.S.C. section 360bbb-3(b)(1), unless the authorization is terminated or revoked.     Resp Syncytial Virus by PCR NEGATIVE NEGATIVE Final    Comment: (NOTE) Fact Sheet for Patients: BloggerCourse.com  Fact Sheet for Healthcare Providers: SeriousBroker.it  This test is not yet approved or cleared by the [place] FDA and has been authorized for detection and/or diagnosis of SARS-CoV-2 by FDA under an Emergency Use Authorization (EUA). This EUA will remain in effect (meaning this test can be used) for the duration of the COVID-19 declaration under Section 564(b)(1) of the Act, 21 U.S.C. section 360bbb-3(b)(1), unless the authorization is terminated or revoked.  Performed at [facility], [address], [place], [place] [address]      Time coordinating discharge: Over 30 minutes  SIGNED:   Charise Killian, MD  [facility] 02/06/2023, 1:43 PM Pager   If 7PM-7AM, please contact night-coverage [email or web address]

## 2023-02-06 NOTE — Plan of Care (Signed)
  Problem: Education: Goal: Ability to describe self-care measures that may prevent or decrease complications (Diabetes Survival Skills Education) will improve Outcome: Adequate for Discharge Goal: Individualized Educational Video(s) Outcome: Adequate for Discharge   Problem: Coping: Goal: Ability to adjust to condition or change in health will improve Outcome: Adequate for Discharge   Problem: Fluid Volume: Goal: Ability to maintain a balanced intake and output will improve Outcome: Adequate for Discharge   Problem: Health Behavior/Discharge Planning: Goal: Ability to identify and utilize available resources and services will improve Outcome: Adequate for Discharge Goal: Ability to manage health-related needs will improve Outcome: Adequate for Discharge   Problem: Metabolic: Goal: Ability to maintain appropriate glucose levels will improve Outcome: Adequate for Discharge   Problem: Nutritional: Goal: Maintenance of adequate nutrition will improve Outcome: Adequate for Discharge Goal: Progress toward achieving an optimal weight will improve Outcome: Adequate for Discharge   Problem: Skin Integrity: Goal: Risk for impaired skin integrity will decrease Outcome: Adequate for Discharge   Problem: Tissue Perfusion: Goal: Adequacy of tissue perfusion will improve Outcome: Adequate for Discharge   Problem: Education: Goal: Knowledge of General Education information will improve Description: Including pain rating scale, medication(s)/side effects and non-pharmacologic comfort measures Outcome: Adequate for Discharge   Problem: Health Behavior/Discharge Planning: Goal: Ability to manage health-related needs will improve Outcome: Adequate for Discharge   Problem: Clinical Measurements: Goal: Ability to maintain clinical measurements within normal limits will improve Outcome: Adequate for Discharge Goal: Will remain free from infection Outcome: Adequate for Discharge Goal:  Diagnostic test results will improve Outcome: Adequate for Discharge Goal: Respiratory complications will improve Outcome: Adequate for Discharge Goal: Cardiovascular complication will be avoided Outcome: Adequate for Discharge   Problem: Activity: Goal: Risk for activity intolerance will decrease Outcome: Adequate for Discharge   Problem: Nutrition: Goal: Adequate nutrition will be maintained Outcome: Adequate for Discharge   Problem: Coping: Goal: Level of anxiety will decrease Outcome: Adequate for Discharge   Problem: Elimination: Goal: Will not experience complications related to bowel motility Outcome: Adequate for Discharge Goal: Will not experience complications related to urinary retention Outcome: Adequate for Discharge   Problem: Pain Managment: Goal: General experience of comfort will improve and/or be controlled Outcome: Adequate for Discharge   Problem: Safety: Goal: Ability to remain free from injury will improve Outcome: Adequate for Discharge   Problem: Skin Integrity: Goal: Risk for impaired skin integrity will decrease Outcome: Adequate for Discharge   Problem: Education: Goal: Knowledge of disease or condition will improve Outcome: Adequate for Discharge Goal: Knowledge of the prescribed therapeutic regimen will improve Outcome: Adequate for Discharge Goal: Individualized Educational Video(s) Outcome: Adequate for Discharge   Problem: Activity: Goal: Ability to tolerate increased activity will improve Outcome: Adequate for Discharge Goal: Will verbalize the importance of balancing activity with adequate rest periods Outcome: Adequate for Discharge   Problem: Respiratory: Goal: Ability to maintain a clear airway will improve Outcome: Adequate for Discharge Goal: Levels of oxygenation will improve Outcome: Adequate for Discharge Goal: Ability to maintain adequate ventilation will improve Outcome: Adequate for Discharge

## 2023-02-06 NOTE — Discharge Instructions (Addendum)
Food Resources  Agency Name: [facility] Agency Address: [address] [place], [place] [address] Phone: [phone] Website: www.[email or web address].org Service(s) Offered: Housing services, self-sufficiency, congregate meal program, weatherization program, Event organiser program, emergency food assistance,  housing counseling, home ownership program, wheels - to work program.  [employer] free for 60 and older at various locations from [employer], Monday-Friday:  [employer], [address] [place], [phone] -[facility], [address], [place] [phone]  -[facility], [address], [place] [phone]  -[address], [place], [phone]  Agency Name: [facility] Address: [phone] [address], [place], [place] [address] Phone: [phone] Website: www.[email or web address] Service(s) Offered: Home delivered hot, frozen, and emergency  meals. Grocery assistance program which matches  volunteers one-on-one with seniors unable to grocery shop  for themselves. Must be 60 years and older; less than 20  hours of in-home aide service, limited or no driving ability;  live alone or with someone with a disability; live in  [place].  Agency Name: [facility]ologist [facility]) Address: [address] [place], [place] [address] Phone: [phone] Service(s) Offered: Food is served to shut-ins, homeless, elderly, and low income people in the community every Saturday (11:30 am-12:30 pm) and Sunday (12:30 pm-1:30pm). Volunteers also offer help and encouragement in seeking employment,  and spiritual guidance.  Agency Name: [facility] Address: [address] [place], [place] [address] Phone: [phone] Service(s) Offered: Child support services; child welfare services; food stamps; Medicaid; work first family assistance; and aid  with fuel,  rent, food and medicine.  Agency Name: [facility] Address: [address], [place], [place] Phone: [phone] Website: [email or web address] Services Offered: Monday 10:00am-12:00, 8:00pm-9:00pm, and Friday 10:00am-12:00.  Agency Name: [facility] [place] Address: [address], [place], [place] [address] Phone: [phone] Website: www.[email or web address].org Service(s) Offered: Serves weekday meals, open from 11:30 am- 1:00 pm., and 6:30-7:30pm, Monday-Wednesday-Friday distributes food 3:30-6pm, Monday-Wednesday-Friday.  Agency Name: [facility] Address: [address] [place], [place] Phone: [phone] Website: [email or web address] Services Offered: Distributes food the 4th Saturday of the month, starting at 8:00 am  Agency Name: [facility] Address: [phone] [place], [place] [address] Phone: [phone] Website: [email or web address] Service(s) Offered: Bread of life, weekly food pantry. Open Wednesdays from 10:00am-noon.  Agency Name: [facility] [employer] Address: [address] [place], [place], [place] Phone: [phone] Services Offered: Distributes food 9am-1pm, Monday-Thursday. Call for details.  Agency Name: [facility] Address: [address], [place], [place] [address] Phone: [phone] Website: firstbaptistburlington.[email or web address] Service(s) Offered: Games developer. Call for assistance.  Agency Name: [facility] Address: [address], [place], [place] [address] Phone: [phone] Service Offered: Emergency Food Pantry. Call for appointment.  Agency Name: [facility] Address: [address], [place], [place] [address] Phone: [phone] Website: [email or web address] Services Offered: Games developer. Call for details  Agency Name: [facility] Address: [address] [place], [place] Phone:  [phone] Website: [email or web address] Service(s) Offered: Emergency Food Pantry. Call for details.  Agency Name: Holiday representative Address: [address], [place], [place] [address] Phone: [phone] or [phone] Website: [email or web address] Service(s) Offered: Distribute food 9am-11:30 am, Tuesday-Friday, and 1-3:30pm, Monday-Friday. Food pantry Monday-Friday 1pm-3pm, fresh items, Mon.-Wed.-Fri.  Agency Name: [facility] Address: [address] [place], [place] [address] Phone: [phone] Website: www.[email or web address].org Services Offered: Distribute food Tues and Sats from 9:00am-noon.  Closed 1st Saturday of each month. Call for details  Agency Name: [employer] [employer] Address: [facility] [address], [place] [address] Phone: [phone]  Services Offered: Delivers meals every Thursday   Transportation Resources  Agency Name: [facility] Agency Address: [address] [place], [place] [address] Phone: [phone] Email: [email or web address] Website: www.[email or web address] Service(s) Offered: Housing services, self-sufficiency, congregate meal program, weatherization program, Field seismologist program, emergency food assistance,  housing counseling, home ownership program, wheels-towork program.  Agency Name: [facility] [employer] [phone]) Address: [address] [place], [place] [address] Phone: [phone] Website: [email or web address].[email or web address] Service(s) Offered: Transportation for [employer], subscription and demand response; Dial-a-Ride for citizens 29 years of age or older.  Agency Name: Department of Social Services Address: [address] [place], [place] [address] Phone: [phone] Service(s) Offered: Child support services; child welfare services; food stamps; Medicaid; work first family assistance; and aid with fuel,  rent,  food and medicine, transportation assistance.  Agency Name: Disabled [employer] (DAV) Transportation  Network Phone: [phone] Service(s) Offered: Transports veterans to the [facility] medical center. Call  forty-eight hours in advance and leave the name, telephone  number, date, and time of appointment. Veteran will be  contacted by the driver the day before the appointment to  arrange a pick up point   Transportation Resources  Agency Name: [facility] Agency Address: [address] [place], [place] [address] Phone: [phone] Email: [email or web address] Website: [email or web address] Service(s) Offered: Housing services, self-sufficiency, congregate meal program, weatherization program, Field seismologist program, emergency food assistance,  housing counseling, home ownership program, wheels-towork program.  Agency Name: [facility] [employer] [phone]) Address: [address], [place], [place] [address] Phone: [phone] Website: [email or web address].[email or web address] Service(s) Offered: Transportation for [employer], subscription and demand response; Dial-a-Ride for citizens 6 years of age or older.  Agency Name: Department of Social Services Address: [address] [place], [place] [address] Phone: [phone] Service(s) Offered: Child support services; child welfare services; food stamps; Medicaid; work first family assistance; and aid with fuel,  rent, food and medicine, transportation assistance.  Agency Name: Disabled [employer] (DAV) Transportation  Network Phone: [phone] Service(s) Offered: Transports veterans to the [facility] medical center. Call  forty-eight hours in advance and leave the name, telephone  number, date, and time of appointment. Veteran will be  contacted by the driver the day before the appointment to  arrange a pick up point    [facility] ACTA currently provides door to door services. ACTA connects with PART daily for services to [facility]. ACTA also performs contract services to [employer] operates 27 vehicles, all but 3 mini-vans are equipped with lifts for special needs as well as the general public. ACTA drivers are each CDL certified and trained in First Aid and CPR. ACTA was established in 2002 by [employer]. An independent Industrial/product designer. ACTA operates via Cytogeneticist with required Research scientist (physical sciences) from [place]. ACTA provides over 80,000 passenger trips each year, including [facility] and [employer] sites.  Call at least by 11 AM one business day prior to needing transportation  [employer].                      [place], [place] [address]     Office Hours: Monday-Friday  8 AM - 5 PM    Rent/Utility/Housing  Agency Name: [facility] Agency Address: [address] [place], [place] [address] Phone: [phone] Email: [email or web address] Website: www.[email or web address] Service(s) Offered: Housing services, self-sufficiency, congregate meal program, weatherization program, Field seismologist program, emergency food assistance,  housing counseling, home ownership program, wheels -towork program.  Agency Name: [facility] Address: [address] [place], [place] [address] Phone: [phone] (8a-4p) [phone] (8p- 10p) Email: [email or web address] Website: [email or web address] Service(s) Offered: A program for homeless and/or needy men that includes one-on-one counseling, life skills training and job rehabilitation.  Agency Name: [facility] [place] Address: [address] [place], [place] [address] Phone: [phone] Website: www.alliedchurches.org Service(s) Offered: Assistance to  needy in emergency with utility bills, heating fuel, and prescriptions. Shelter for homeless 7pm-7am. May 05, 2016 15  Agency Name: [facility] [place] (Developmentally Disabled) Address: [address], [place], [place] [address] Phone: [phone] Contact Person: Cathleen Corti Email: [email or web address] Website: [email or web address] Service(s) Offered: Helps individuals with developmental disabilities move from housing that is more restrictive to homes where they  can achieve greater independence and have more  opportunities.  Agency Name: [facility] Address: [address], [place], [place] [address] Phone: [phone] Email: [email or web address] Website: www.[email or web address].org Service(s) Offered: Provides affordable housing for low-income families, elderly, and disabled individuals. Offer a wide range of  programs and services, from financial planning to afterschool and summer programs.  Agency Name: Department of Social Services Address: [address] [place], [place] [address] Phone: [phone] Service(s) Offered: Child support services; child welfare services; food stamps; Medicaid; work first family assistance; and aid with fuel,  rent, food and medicine.  Agency Name: [facility] [place] [employer]. Address: [facility] [address], [place], [place]  [address] Phone: [phone] Website: www.familyabuseservices.org Service(s) Offered: 24 hour Crisis Line: [phone]; 24 hour Emergency Shelter; Transitional Housing; Support Groups; Scientist, physiological; [employer]; Hispanic Outreach: [phone];  Visitation Center: [phone].  Agency Name: [facility] [place]. Address: [address] [place], [place] [address] Phone: [phone] Service(s) Offered: CAP Services; Home and [employer]; Individual or Group Supports; Respite Care Non-Institutional Nursing;  Residential Supports; Respite Care  and Personal Care Services; Transportation; Family and Friends Night; Recreational Activities; Three Nutritious Meals/Snacks; Consultation with Registered Dietician; Twenty-four hour Registered Nurse Access; Daily and [employer] and [employer]s; Camp Green Leaves; [place] for the [employer] (During Summer Months) Bingo Night (Every  Wednesday Night); Special Populations Dance Night  (Every Tuesday Night); Professional Hair Care Services.  Agency Name: [facility] Address: [address], [place], [place] [address] Phone: [phone] Contact Person: Jabier Mutton Website: [email or web address].Physicist, medical) Offered: Residential treatment facility for women; food and  clothing, educational & employment development and  transportation to work; Counsellor of financial skills;  parenting and family reunification; emotional and spiritual  support; transitional housing for program graduates.  Agency Name: [facility] Address: [address] [place], [place] [address] Phone: [phone] Email: [email or web address] Website: TaskTown.es Service(s) Offered: Public housing units for elderly, disabled, and low income people; housing choice vouchers for income eligible  applicants; shelter plus care vouchers; and Psychologist, clinical.  Agency Name: [facility] [employer] Address: [address] [place], [place] [address] Phone: [phone] Email: habitat1@netzero .net Website: www.habitatalamance.org Service(s) Offered: Build houses for families in need of decent housing. Each adult in the family must invest 200 hours of labor on  someone else's house, work with volunteers to build their own house, attend classes on budgeting,  home maintenance, yard care, and attend homeowner association meetings.  Agency Name: [facility] Address: [address] [place], [place] [address] Phone:  [phone] Website: www.rsli.org Service(s) Offered: Intermediate care facilities for intellectually delayed, Supervised Living in group homes for adults with developmental disabilities, Supervised Living for people who have dual diagnoses (MRMI), Independent Living, Supported Living, respite and a variety of CAP services, pre-vocational services, day supports, and [employer].  Agency Name: [place] [employer] Phone: [phone] Website: www.NCForeclosurePrevention.gov Service(s) Offered: Zero-interest, deferred loans to homeowners struggling to pay their mortgage. Call for more information.

## 2023-02-06 NOTE — Plan of Care (Signed)

## 2023-02-06 NOTE — TOC Transition Note (Signed)
Transition of Care [facility]) - Discharge Note   Patient Details  Name: Caitlin Rivera MRN: [id number] Date of Birth: 04-28-1955  [facility] CM/SW Contact:  Margarito Liner, LCSW Phone Number: 02/06/2023, 1:47 PM   Clinical Narrative:  Patient has orders to discharge home today. Per RN, she will be going to the same residence she was staying at prior to admission. Patient has been weaned off oxygen. No further concerns. CSW signing off.   Final next level of care: Home/Self Care Barriers to Discharge: Barriers Resolved   Patient Goals and CMS Choice            Discharge Placement                    Patient and family notified of of transfer: 02/06/23  Discharge Plan and Services Additional resources added to the After Visit Summary for       Post Acute Care Choice: NA                               Social Drivers of Health (SDOH) Interventions SDOH Screenings   Food Insecurity: Food Insecurity Present (02/04/2023)  Housing: High Risk (02/03/2023)  Transportation Needs: Unmet Transportation Needs (02/03/2023)  Utilities: Not At Risk (02/03/2023)  Social Connections: Moderately Isolated (02/03/2023)  Tobacco Use: High Risk (02/02/2023)     Readmission Risk Interventions     No data to display

## 2023-02-06 NOTE — Plan of Care (Signed)
  Problem: Health Behavior/Discharge Planning: Goal: Ability to identify and utilize available resources and services will improve Outcome: Progressing

## 2023-02-06 NOTE — TOC Progression Note (Signed)
Transition of Care [facility]) - Progression Note    Patient Details  Name: Caitlin Rivera MRN: [id number] Date of Birth: Dec 04, 1955  Transition of Care [facility]) CM/SW Contact  Margarito Liner, LCSW Phone Number: 02/06/2023, 11:43 AM  Clinical Narrative:   SDOH flags for food, housing, and transportation. Resources added to AVS.  Expected Discharge Plan:  (TBD) Barriers to Discharge: Continued Medical Work up  Expected Discharge Plan and Services     Post Acute Care Choice: NA Living arrangements for the past 2 months: Single Family Home                                       Social Determinants of Health (SDOH) Interventions SDOH Screenings   Food Insecurity: Food Insecurity Present (02/04/2023)  Housing: High Risk (02/03/2023)  Transportation Needs: Unmet Transportation Needs (02/03/2023)  Utilities: Not At Risk (02/03/2023)  Social Connections: Moderately Isolated (02/03/2023)  Tobacco Use: High Risk (02/02/2023)    Readmission Risk Interventions     No data to display

## 2023-02-10 ENCOUNTER — Emergency Department
Admission: EM | Admit: 2023-02-10 | Discharge: 2023-02-11 | Disposition: A | Discharge status: Other Acute Inpatient Hospital | Payer: 59 | Source: Home / Self Care | Attending: Emergency Medicine | Admitting: Emergency Medicine

## 2023-02-10 ENCOUNTER — Emergency Department: Payer: 59

## 2023-02-10 ENCOUNTER — Other Ambulatory Visit: Payer: Self-pay

## 2023-02-10 DIAGNOSIS — Z20822 Contact with and (suspected) exposure to covid-19: Secondary | ICD-10-CM | POA: Insufficient documentation

## 2023-02-10 DIAGNOSIS — F332 Major depressive disorder, recurrent severe without psychotic features: Secondary | ICD-10-CM | POA: Diagnosis not present

## 2023-02-10 DIAGNOSIS — F329 Major depressive disorder, single episode, unspecified: Secondary | ICD-10-CM | POA: Insufficient documentation

## 2023-02-10 DIAGNOSIS — R45851 Suicidal ideations: Secondary | ICD-10-CM | POA: Insufficient documentation

## 2023-02-10 DIAGNOSIS — F419 Anxiety disorder, unspecified: Secondary | ICD-10-CM

## 2023-02-10 DIAGNOSIS — F1910 Other psychoactive substance abuse, uncomplicated: Secondary | ICD-10-CM

## 2023-02-10 LAB — COMPREHENSIVE METABOLIC PANEL
ALT: 31 U/L (ref 0–44)
AST: 21 U/L (ref 15–41)
Albumin: 3.8 g/dL (ref 3.5–5.0)
Alkaline Phosphatase: 72 U/L (ref 38–126)
Anion gap: 11 (ref 5–15)
BUN: 26 mg/dL — ABNORMAL HIGH (ref 8–23)
CO2: 25 mmol/L (ref 22–32)
Calcium: 8.5 mg/dL — ABNORMAL LOW (ref 8.9–10.3)
Chloride: 103 mmol/L (ref 98–111)
Creatinine, Ser: 0.87 mg/dL (ref 0.44–1.00)
GFR, Estimated: >60 mL/min (ref >60)
Glucose, Bld: 233 mg/dL — ABNORMAL HIGH (ref 70–99)
Potassium: 3.5 mmol/L (ref 3.5–5.1)
Sodium: 139 mmol/L (ref 135–145)
Total Bilirubin: 0.8 mg/dL (ref 0.0–1.2)
Total Protein: 6.9 g/dL (ref 6.5–8.1)

## 2023-02-10 LAB — URINE DRUG SCREEN, QUALITATIVE (ARMC ONLY)
Amphetamines, Ur Screen: NOT DETECTED
Barbiturates, Ur Screen: NOT DETECTED
Benzodiazepine, Ur Scrn: NOT DETECTED
Cannabinoid 50 Ng, Ur ~~LOC~~: POSITIVE — AB
Cocaine Metabolite,Ur ~~LOC~~: POSITIVE — AB
MDMA (Ecstasy)Ur Screen: NOT DETECTED
Methadone Scn, Ur: NOT DETECTED
Opiate, Ur Screen: NOT DETECTED
Phencyclidine (PCP) Ur S: NOT DETECTED
Tricyclic, Ur Screen: NOT DETECTED

## 2023-02-10 LAB — ETHANOL: Alcohol, Ethyl (B): <10 mg/dL (ref <10)

## 2023-02-10 LAB — CBC
HCT: 48.1 % — ABNORMAL HIGH (ref 36.0–46.0)
Hemoglobin: 15.1 g/dL — ABNORMAL HIGH (ref 12.0–15.0)
MCH: 29.4 pg (ref 26.0–34.0)
MCHC: 31.4 g/dL (ref 30.0–36.0)
MCV: 93.6 fL (ref 80.0–100.0)
Platelets: 283 10*3/uL (ref 150–400)
RBC: 5.14 MIL/uL — ABNORMAL HIGH (ref 3.87–5.11)
RDW: 14.4 % (ref 11.5–15.5)
WBC: 8.6 10*3/uL (ref 4.0–10.5)
nRBC: 0 % (ref 0.0–0.2)

## 2023-02-10 LAB — SALICYLATE LEVEL: Salicylate Lvl: <7 mg/dL — ABNORMAL LOW (ref 7.0–30.0)

## 2023-02-10 LAB — ACETAMINOPHEN LEVEL: Acetaminophen (Tylenol), Serum: <10 ug/mL — ABNORMAL LOW (ref 10–30)

## 2023-02-10 NOTE — ED Provider Notes (Signed)
-----------------------------------------   11:33 PM on 02/10/2023 -----------------------------------------  Assuming care from Dr. Fuller Plan.  In short, Caitlin Rivera is a 68 y.o. female with a chief complaint of AMS and SI.  Refer to the original H&P for additional details.  The current plan of care is to follow up on CT head and psych recommendations.   Clinical Course as of 02/11/23 0017  Sat Feb 11, 2023  0016 CT HEAD WO CONTRAST ( ) I viewed and interpreted the patient's CT head and see no evidence of any acute abnormality such as evidence of neoplasm or intracranial bleeding.  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 [CF]  0017 Patient is medically cleared for psychiatric evaluation.  The patient has been placed in psychiatric observation due to the need to provide a safe environment for the patient while obtaining psychiatric consultation and evaluation, as well as ongoing medical and medication management to treat the patient's condition.  The patient has not been placed under full IVC at this time.   [CF]    Clinical Course User Index [CF] Loleta Rose, MD     Medications - No data to display   ED Discharge Orders     None      Final diagnoses:  Suicidal ideation     Loleta Rose, MD 02/11/23 [phone]

## 2023-02-10 NOTE — ED Notes (Addendum)
NT [place] dressed Pt out in triage, Patient Belongings include: Wallace Cullens pair of socks Pink pair of shoes Green jacket 2x white shirt Red lighter Black tank top [employer] rag, [employer] rag [employer] bra [employer] Black bracelet White underwear Brien Few book bag

## 2023-02-10 NOTE — BH Assessment (Signed)
Patient is unable to participate in assessment at this time. Patient unable to be awaken. TTS will continue follow-up.

## 2023-02-10 NOTE — ED Provider Notes (Signed)
[facility] Provider Note    Event Date/Time   First MD Initiated Contact with Patient 02/10/23 2211     (approximate)   History   Psychiatric Evaluation   HPI  Caitlin Rivera is a 68 y.o. female who comes in with concerns for having a nervous breakdown from thoughts of SI.  Patient does report using crack today.  To me patient has some difficulties expressing what is going on.  Unable to get a reliable history from her.   With a tech I was able to get the patient to sit up and talk to her.  Patient states that she took her medications to help with her sleep and that she is just tired.  She denies any alcohol or drugs to me.  She states that her voice is always like this.  She denies taking any medications to try to hurt herself.  She denies any falls.  Physical Exam   Triage Vital Signs: ED Triage Vitals [02/10/23 2128]  Encounter Vitals Group     BP 126/69     Systolic BP Percentile      Diastolic BP Percentile      Pulse Rate 89     Resp 18     Temp 98 F (36.7 C)     Temp Source Oral     SpO2 98 %     Weight      Height      Head Circumference      Peak Flow      Pain Score      Pain Loc      Pain Education      Exclude from Growth Chart     Most recent vital signs: Vitals:   02/10/23 2128  BP: 126/69  Pulse: 89  Resp: 18  Temp: 98 F (36.7 C)  SpO2: 98%     General: Awake, no distress.  CV:  Good peripheral perfusion.  Resp:  Normal effort.  Abd:  No distention.  Other:  Patient is edentulous.  She is got equal strength in arms and legs.  She is somewhat difficult to understand but patient states that this is baseline.  She is sleepy but is able to be woken up and answer questions.   ED Results / Procedures / Treatments   Labs (all labs ordered are listed, but only abnormal results are displayed) Labs Reviewed  COMPREHENSIVE METABOLIC PANEL  ETHANOL  SALICYLATE LEVEL  ACETAMINOPHEN LEVEL  CBC  URINE DRUG  SCREEN, QUALITATIVE ([facility] ONLY)     RADIOLOGY Pending  PROCEDURES:  Critical Care performed: No  Procedures   MEDICATIONS ORDERED IN ED: Medications - No data to display   IMPRESSION / MDM / ASSESSMENT AND PLAN / ED COURSE  I reviewed the triage vital signs and the nursing notes.   Patient's presentation is most consistent with acute presentation with potential threat to life or bodily function.   Pt is without any acute medical complaints.  However patient is significantly somnolent but is able to be woken up with noxious stimuli and that we are able to set her up in bed and she is able to follow commands she does not appear to have any neurodeficits.  Suspect that this is related to substances and she reports taking her medications but denies any SI attempt.  The only sedating medication I see that she on is tizanidine so not sure what could be making her at this drowsy and less it  could be drugs although crack would not explain this.  Given the change in mental status will get CT imaging to evaluate for acute pathology however I do not feel like his stroke code needs to be called at this time given she is able to wake up and follow commands without neurological deficits.  No exam findings to suggest medical cause of current presentation. Will order psychiatric screening labs and discuss further w/ psychiatric service.  D/d includes but is not limited to psychiatric disease, behavioral/personality disorder, inadequate socioeconomic support, medical.  Based on HPI, exam, unremarkable labs, no concern for acute medical problem at this time. No rigidity, clonus, hyperthermia, focal neurologic deficit, diaphoresis, tachycardia, meningismus, ataxia, gait abnormality or other finding to suggest this visit represents a non-psychiatric problem. Screening labs reviewed.    Given this, pt medically cleared, to be dispositioned per Psych.    The patient has been placed in psychiatric  observation due to the need to provide a safe environment for the patient while obtaining psychiatric consultation and evaluation, as well as ongoing medical and medication management to treat the patient's condition.  The patient has not been placed under full IVC at this time.    The patient is on the cardiac monitor to evaluate for evidence of arrhythmia and/or significant heart rate changes.      FINAL CLINICAL IMPRESSION(S) / ED DIAGNOSES   Final diagnoses:  Suicidal ideation     Rx / DC Orders   ED Discharge Orders     None        Note:  This document was prepared using Dragon voice recognition software and may include unintentional dictation errors.   Concha Se, MD 02/10/23 2300

## 2023-02-10 NOTE — ED Triage Notes (Addendum)
Patient states she's having a nervous breakdown and wants to kill herself. Patient endorses using crack today.

## 2023-02-10 NOTE — BH Assessment (Signed)
Patient is unable to participate in assessment at this time.  Patient unable to be awaken. Psych will continue follow-up.

## 2023-02-11 ENCOUNTER — Inpatient Hospital Stay
Admission: AD | Admit: 2023-02-11 | Discharge: 2023-02-16 | DRG: 885 | Disposition: A | Discharge status: Home / Self Care | Payer: 59 | Source: Intra-hospital | Attending: Psychiatry | Admitting: Psychiatry

## 2023-02-11 ENCOUNTER — Encounter: Payer: Self-pay | Admitting: Psychiatry

## 2023-02-11 DIAGNOSIS — Z79899 Other long term (current) drug therapy: Secondary | ICD-10-CM

## 2023-02-11 DIAGNOSIS — F332 Major depressive disorder, recurrent severe without psychotic features: Principal | ICD-10-CM | POA: Diagnosis present

## 2023-02-11 DIAGNOSIS — Z5941 Food insecurity: Secondary | ICD-10-CM

## 2023-02-11 DIAGNOSIS — Z5948 Other specified lack of adequate food: Secondary | ICD-10-CM

## 2023-02-11 DIAGNOSIS — Z5982 Transportation insecurity: Secondary | ICD-10-CM

## 2023-02-11 DIAGNOSIS — Z7984 Long term (current) use of oral hypoglycemic drugs: Secondary | ICD-10-CM | POA: Diagnosis not present

## 2023-02-11 DIAGNOSIS — Z886 Allergy status to analgesic agent status: Secondary | ICD-10-CM | POA: Diagnosis not present

## 2023-02-11 DIAGNOSIS — J4489 Other specified chronic obstructive pulmonary disease: Secondary | ICD-10-CM | POA: Diagnosis present

## 2023-02-11 DIAGNOSIS — F1490 Cocaine use, unspecified, uncomplicated: Secondary | ICD-10-CM | POA: Diagnosis present

## 2023-02-11 DIAGNOSIS — R443 Hallucinations, unspecified: Secondary | ICD-10-CM | POA: Diagnosis present

## 2023-02-11 DIAGNOSIS — F410 Panic disorder [episodic paroxysmal anxiety] without agoraphobia: Secondary | ICD-10-CM | POA: Diagnosis present

## 2023-02-11 DIAGNOSIS — F32A Depression, unspecified: Principal | ICD-10-CM | POA: Diagnosis present

## 2023-02-11 DIAGNOSIS — R45851 Suicidal ideations: Secondary | ICD-10-CM | POA: Diagnosis present

## 2023-02-11 DIAGNOSIS — Z5900 Homelessness unspecified: Secondary | ICD-10-CM

## 2023-02-11 DIAGNOSIS — Z79811 Long term (current) use of aromatase inhibitors: Secondary | ICD-10-CM

## 2023-02-11 DIAGNOSIS — I10 Essential (primary) hypertension: Secondary | ICD-10-CM | POA: Diagnosis present

## 2023-02-11 DIAGNOSIS — Z86000 Personal history of in-situ neoplasm of breast: Secondary | ICD-10-CM

## 2023-02-11 DIAGNOSIS — F17210 Nicotine dependence, cigarettes, uncomplicated: Secondary | ICD-10-CM | POA: Diagnosis present

## 2023-02-11 DIAGNOSIS — Z1152 Encounter for screening for COVID-19: Secondary | ICD-10-CM | POA: Diagnosis not present

## 2023-02-11 DIAGNOSIS — Z923 Personal history of irradiation: Secondary | ICD-10-CM

## 2023-02-11 DIAGNOSIS — Z7951 Long term (current) use of inhaled steroids: Secondary | ICD-10-CM

## 2023-02-11 DIAGNOSIS — Z885 Allergy status to narcotic agent status: Secondary | ICD-10-CM | POA: Diagnosis not present

## 2023-02-11 LAB — CBG MONITORING, ED: Glucose-Capillary: 143 mg/dL — ABNORMAL HIGH (ref 70–99)

## 2023-02-11 LAB — RESP PANEL BY RT-PCR (RSV, FLU A&B, COVID)  RVPGX2
Influenza A by PCR: NEGATIVE
Influenza B by PCR: NEGATIVE
Resp Syncytial Virus by PCR: NEGATIVE
SARS Coronavirus 2 by RT PCR: NEGATIVE

## 2023-02-11 MED ORDER — FLUTICASONE FUROATE-VILANTEROL 200-25 MCG/ACT IN AEPB
1.0000 | INHALATION_SPRAY | Freq: Every day | RESPIRATORY_TRACT | Status: DC
  Filled 2023-02-11: qty 28

## 2023-02-11 MED ORDER — ANASTROZOLE 1 MG PO TABS
1.0000 mg | ORAL_TABLET | Freq: Every day | ORAL | Status: DC
  Administered 2023-02-12 – 2023-02-16 (×5): 1 mg via ORAL
  Filled 2023-02-11 (×5): qty 1

## 2023-02-11 MED ORDER — GABAPENTIN 300 MG PO CAPS
600.0000 mg | ORAL_CAPSULE | Freq: Two times a day (BID) | ORAL | Status: DC
  Administered 2023-02-11 – 2023-02-16 (×10): 600 mg via ORAL
  Filled 2023-02-11 (×10): qty 2

## 2023-02-11 MED ORDER — ROSUVASTATIN CALCIUM 10 MG PO TABS
20.0000 mg | ORAL_TABLET | Freq: Every day | ORAL | Status: DC
  Administered 2023-02-12 – 2023-02-16 (×5): 20 mg via ORAL
  Filled 2023-02-11 (×5): qty 2

## 2023-02-11 MED ORDER — FLUTICASONE FUROATE-VILANTEROL 200-25 MCG/ACT IN AEPB: 1.0000 | INHALATION_SPRAY | Freq: Every day | RESPIRATORY_TRACT | Status: DC

## 2023-02-11 MED ORDER — GABAPENTIN 600 MG PO TABS: 600.0000 mg | ORAL_TABLET | Freq: Two times a day (BID) | ORAL | Status: DC

## 2023-02-11 MED ORDER — OLANZAPINE 5 MG PO TBDP
5.0000 mg | ORAL_TABLET | Freq: Three times a day (TID) | ORAL | Status: DC | PRN
  Administered 2023-02-11 – 2023-02-12 (×3): 5 mg via ORAL
  Filled 2023-02-11 (×3): qty 1

## 2023-02-11 MED ORDER — METFORMIN HCL 500 MG PO TABS: 500.0000 mg | ORAL_TABLET | Freq: Every day | ORAL | Status: DC

## 2023-02-11 MED ORDER — DULOXETINE HCL 20 MG PO CPEP: 20.0000 mg | ORAL_CAPSULE | Freq: Two times a day (BID) | ORAL | Status: DC

## 2023-02-11 MED ORDER — MAGNESIUM HYDROXIDE 400 MG/5ML PO SUSP: 30.0000 mL | Freq: Every day | ORAL | Status: DC | PRN

## 2023-02-11 MED ORDER — ALUM & MAG HYDROXIDE-SIMETH 200-200-20 MG/5ML PO SUSP: 30.0000 mL | ORAL | Status: DC | PRN

## 2023-02-11 MED ORDER — PRAZOSIN HCL 1 MG PO CAPS
1.0000 mg | ORAL_CAPSULE | Freq: Every day | ORAL | Status: DC
  Administered 2023-02-11 – 2023-02-15 (×4): 1 mg via ORAL
  Filled 2023-02-11 (×5): qty 1

## 2023-02-11 MED ORDER — ANASTROZOLE 1 MG PO TABS: 1.0000 mg | ORAL_TABLET | Freq: Every day | ORAL | Status: DC

## 2023-02-11 MED ORDER — ACETAMINOPHEN 325 MG PO TABS
650.0000 mg | ORAL_TABLET | Freq: Four times a day (QID) | ORAL | Status: DC | PRN
  Administered 2023-02-11 – 2023-02-16 (×3): 650 mg via ORAL
  Filled 2023-02-11 (×3): qty 2

## 2023-02-11 MED ORDER — AMLODIPINE BESYLATE 5 MG PO TABS
5.0000 mg | ORAL_TABLET | Freq: Every day | ORAL | Status: DC
  Administered 2023-02-13 – 2023-02-16 (×4): 5 mg via ORAL
  Filled 2023-02-11 (×5): qty 1

## 2023-02-11 MED ORDER — PRAZOSIN HCL 1 MG PO CAPS: 1.0000 mg | ORAL_CAPSULE | Freq: Every day | ORAL | Status: DC

## 2023-02-11 MED ORDER — AMLODIPINE BESYLATE 5 MG PO TABS: 5.0000 mg | ORAL_TABLET | Freq: Every day | ORAL | Status: DC

## 2023-02-11 MED ORDER — ROSUVASTATIN CALCIUM 20 MG PO TABS: 20.0000 mg | ORAL_TABLET | Freq: Every day | ORAL | Status: DC

## 2023-02-11 MED ORDER — METFORMIN HCL 500 MG PO TABS
500.0000 mg | ORAL_TABLET | Freq: Every day | ORAL | Status: DC
  Administered 2023-02-12 – 2023-02-16 (×5): 500 mg via ORAL
  Filled 2023-02-11 (×5): qty 1

## 2023-02-11 MED ORDER — TIZANIDINE HCL 4 MG PO TABS: 4.0000 mg | ORAL_TABLET | Freq: Three times a day (TID) | ORAL | Status: DC | PRN

## 2023-02-11 MED ORDER — DULOXETINE HCL 20 MG PO CPEP
20.0000 mg | ORAL_CAPSULE | Freq: Two times a day (BID) | ORAL | Status: DC
  Administered 2023-02-12 – 2023-02-16 (×8): 20 mg via ORAL
  Filled 2023-02-11 (×10): qty 1

## 2023-02-11 MED ORDER — TIZANIDINE HCL 2 MG PO TABS
4.0000 mg | ORAL_TABLET | Freq: Three times a day (TID) | ORAL | Status: DC | PRN
  Administered 2023-02-11 – 2023-02-15 (×7): 4 mg via ORAL
  Filled 2023-02-11 (×8): qty 2

## 2023-02-11 NOTE — Consult Note (Cosign Needed Addendum)
[facility] Psychiatric Consult Initial  Patient Name: .Caitlin Rivera  MRN: [id number]  DOB: May 03, 1955  Consult Order details:  Orders (From admission, onward)     Start     Ordered   02/10/23 2213  CONSULT TO CALL ACT TEAM       Ordering Provider: Concha Se, MD  Provider:  (Not yet assigned)  Question:  Reason for Consult?  Answer:  Psych consult   02/10/23 2212   02/10/23 2213  IP CONSULT TO PSYCHIATRY       Ordering Provider: Concha Se, MD  Provider:  (Not yet assigned)  Question Answer Comment  Place call to: [phone]   Reason for Consult Admit      02/10/23 2212             Mode of Visit: Tele-visit Virtual Statement:TELE PSYCHIATRY ATTESTATION & CONSENT As the provider for this telehealth consult, I attest that I verified the patient's identity using two separate identifiers, introduced myself to the patient, provided my credentials, disclosed my location, and performed this encounter via a HIPAA-compliant, real-time, face-to-face, two-way, interactive audio and video platform and with the full consent and agreement of the patient (or guardian as applicable.) Patient physical location: [facility] ED. Telehealth provider physical location: home office in state of [place].   Video start time: 11:55 AM Video end time: 12:15 PM    Psychiatry Consult Evaluation  Service Date: February 11, 2023 LOS:  LOS: 0 days  Chief Complaint "I talk to dead people"  Primary Psychiatric Diagnoses  Suicidal ideations   Assessment  Caitlin Rivera is a 68 y.o. female presenting to [facility] emergency department with bizarre behavior and suicidal ideations.   Patient states "I had a nervous breakdown.  Thinking crazy in my head".  Patient reports that she plans to take all of her medications as a means to commit suicide.  She denies homicidal ideations.  Patient states I talk to dead people but would not elaborate further. Patient noted with limited engagement due to  bizarre behavior.  Patient sitting on bedside, stating I am passing out and then falling backwards.  Patient then continues to verbally engage during the visit.  Patient repeated this behavior multiple times throughout the visit, while eating a sandwich.    She reports a psychiatric history of bipolar disorder and anxiety.  Patient reports that she is prescribed prazosin and Cymbalta.  She reports medication compliance prior to presentation.  Per her medical record patient is also prescribed hydroxyzine.  She denies prior hospitalizations.  Patient does report working with the community organization to find housing.  She was unable to identify if this community organization provided psychiatric treatment but patiently reportedly gets her psychotropic medications from a community provider.  Patient reports homelessness, stating that her uncle will not allow her to stay at his house anymore because of her daughter.  She reports having to sleep at the park and being picked up recently by law enforcement due to being in the park, prior to her presentation today.  Patient reports a strained relationship with her 3 daughters.  Patient reports that her daughters cannot wait to put her in a rest home.  She reports having 9 grandchildren. Patient noted on her bed with her meal tray.  Patient noted with unintelligible whispers throughout the visit.  She appears to be responding to internal stimuli.  She is noted to be disheveled. She reports a good appetite and sleep cycle.  Patient  reports having no social support.  Urine drug screen positive for cannabinoid and cocaine metabolites.   She meets criteria for inpatient psychiatry based on suicidal ideations with intent and plan.  Current outpatient psychotropic medications include Cymbalta 20 mg, hydroxyzine 50 mg, and prazosin 1 mg. Response to the medication regimen is unknown as patient is unable to respond appropriately to this question.   Diagnoses:  Active  Hospital problems: Active Problems:   Suicidal ideation   Plan   ## Psychiatric Medication Recommendations:  home medications: Cymbalta 20 mg daily and Prazosin 1 mg at bedtime restarted upon admission  ## Medical Decision Making Capacity: Not specifically addressed in this encounter  ## Further Work-up:  -- EKG pending   -- Pertinent labwork reviewed earlier this admission includes: CMP, Acetaminophen, Salicylate level, Ethyl Alcohol, Urine drug screen (positive for cocaine metabolite and cannabinoid), head CT.   ## Disposition:-- We recommend inpatient psychiatric hospitalization when medically cleared. Patient is under voluntary admission status at this time; please IVC if attempts to leave hospital.  ## Behavioral / Environmental: - No specific recommendations at this time.     ## Safety and Observation Level:  - Based on my clinical evaluation, I estimate the patient to be at low risk of self harm in the current setting. - At this time, we recommend  routine. This decision is based on my review of the chart including patient's history and current presentation, interview of the patient, mental status examination, and consideration of suicide risk including evaluating suicidal ideation, plan, intent, suicidal or self-harm behaviors, risk factors, and protective factors. This judgment is based on our ability to directly address suicide risk, implement suicide prevention strategies, and develop a safety plan while the patient is in the clinical setting. Please contact our team if there is a concern that risk level has changed.  CSSR Risk Category:C-SSRS RISK CATEGORY: High Risk  Suicide Risk Assessment: Patient has following modifiable risk factors for suicide: active suicidal ideation, which we are addressing by inpatient psychiatry. Patient has following non-modifiable or demographic risk factors for suicide: none reported Patient has the following protective factors against  suicide: Access to outpatient mental health care.   Thank you for this consult request. Recommendations have been communicated to the primary team.  We will recommend inpatient psychiatry at this time.   Mcneil Sober, NP       History of Present Illness  Relevant Aspects of Hospital ED Course:  Admitted on 02/10/2023 for suicidal ideations with intent and plan.   Patient Report:  Thinking crazy in my head  Psych ROS:  Depression: Yes Anxiety: Diagnosed history Mania (lifetime and current): Patient reports a history of bipolar disorder Psychosis: (lifetime and current): Patient reports I talk to dead people  Collateral information:  Contacted daughter Deanna Artis at [phone] on February 11, 2023 without success.  Collaborated with TTS to obtain collateral.  Review of Systems  Psychiatric/Behavioral:  Positive for hallucinations, substance abuse and suicidal ideas. Negative for memory loss.   All other systems reviewed and are negative.    Psychiatric and Social History  Psychiatric History:  Information collected from patient and emergency department team  Prev Dx/Sx: Per patient, bipolar disorder and anxiety Current Psych Provider: Patient unable to identify Home Meds (current): Cymbalta, hydroxyzine, and prazosin Previous Med Trials: Cymbalta, hydroxyzine and prazosin Therapy: Patient unable to identify  Prior Psych Hospitalization: Patient denies Prior Self Harm: Patient unable to identify Prior Violence: Denies  Family Psych History: Denies Family Hx suicide:  Denies  Social History:  Developmental Hx: Unable to determine during assessment Educational Hx: Unable to determine during assessment Occupational Hx: Unable to determine during assessment Legal Hx: Denies Living Situation: Homelessness Spiritual Hx: Unable to determine during assessment Access to weapons/lethal means: No  Substance History Alcohol: Denies Type of alcohol NA Last Drink NA Number of drinks  per day NA History of alcohol withdrawal seizures N/A History of DT's N/A Tobacco: Unknown Illicit drugs: Urine drug screen positive for cannabinoid and cocaine metabolites Prescription drug abuse: Unknown Rehab hx: Unknown  Exam Findings  Physical Exam: No pain reported Vital Signs:  Temp:  [98 F (36.7 C)-98.6 F (37 C)] 98.6 F (37 C) (02/01 0945) Pulse Rate:  [79-89] 79 (02/01 0945) Resp:  [18-20] 20 (02/01 0945) BP: (112-126)/(61-69) 112/61 (02/01 0945) SpO2:  [91 %-98 %] 91 % (02/01 0945) Blood pressure 112/61, pulse 79, temperature 98.6 F (37 C), temperature source Oral, resp. rate 20, SpO2 91%. There is no height or weight on file to calculate BMI.  Physical Exam Vitals and nursing note reviewed.  Neurological:     General: No focal deficit present.     Mental Status: She is oriented to person, place, and time.     Mental Status Exam: General Appearance: Disheveled  Orientation:  Full (Time, Place, and Person)  Memory:  Immediate;   Fair Recent;   Fair Remote;   Fair  Concentration:  Concentration: Poor  Recall:  Fair  Attention  Poor  Eye Contact:  Fair  Speech:  Garbled  Language:  Fair  Volume:  Decreased  Mood: depressed  Affect:  Congruent  Thought Process:  Disorganized  Thought Content:  Illogical  Suicidal Thoughts:  Yes.  with intent/plan  Homicidal Thoughts:  No  Judgement:  Poor  Insight:  Lacking  Psychomotor Activity:  Psychomotor Retardation  Akathisia:  No  Fund of Knowledge:  Fair      Assets:  Desire for Improvement  Cognition:  WNL  ADL's:  Impaired  AIMS (if indicated):       Other History   These have been pulled in through the EMR, reviewed, and updated if appropriate.  Family History:  The patient's family history includes Cancer in her mother.  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   COPD (chronic obstructive pulmonary disease) (HCC)    smoker    Diabetes mellitus without complication (HCC)    Fibroids    Hypertension    Personal history of radiation therapy    Smoker     Surgical History: Past Surgical History:  Procedure Laterality Date   BREAST BIOPSY Bilateral 06/03/2019   BREAST BIOPSY Left 06/14/2019   BREAST EXCISIONAL BIOPSY Right 07/2019   BREAST LUMPECTOMY Left 07/2019   BREAST LUMPECTOMY WITH RADIOACTIVE SEED LOCALIZATION Bilateral 07/31/2019   Procedure: BILATERAL BREAST LUMPECTOMY WITH RADIOACTIVE SEED LOCALIZATION;  Surgeon: Griselda Miner, MD;  Location: [facility];  Service: General;  Laterality: Bilateral;   TUBAL LIGATION     TUBAL LIGATION       Medications:   Current Facility-Administered Medications:    amLODipine (NORVASC) tablet 5 mg, 5 mg, Oral, Daily, Delton Prairie, MD   anastrozole (ARIMIDEX) tablet 1 mg, 1 mg, Oral, Daily, Delton Prairie, MD   DULoxetine (CYMBALTA) DR capsule 20 mg, 20 mg, Oral, BID, Delton Prairie, MD   fluticasone furoate-vilanterol (BREO ELLIPTA) 200-25 MCG/ACT 1 puff, 1  puff, Inhalation, Daily, Delton Prairie, MD   gabapentin (NEURONTIN) tablet 600 mg, 600 mg, Oral, BID, Delton Prairie, MD   Melene Muller ON 02/12/2023] metFORMIN (GLUCOPHAGE) tablet 500 mg, 500 mg, Oral, Q breakfast, Delton Prairie, MD   prazosin (MINIPRESS) capsule 1 mg, 1 mg, Oral, QHS, Delton Prairie, MD   rosuvastatin (CRESTOR) tablet 20 mg, 20 mg, Oral, Daily, Delton Prairie, MD   tiZANidine (ZANAFLEX) tablet 4 mg, 4 mg, Oral, Q8H PRN, Delton Prairie, MD  Current Outpatient Medications:    acetaminophen (TYLENOL 8 HOUR) 650 MG CR tablet, Take 1 tablet (650 mg total) by mouth every 8 (eight) hours as needed for pain or fever., Disp: 30 tablet, Rfl: 0   albuterol (VENTOLIN HFA) 108 (90 Base) MCG/ACT inhaler, Inhale 1-2 puffs into the lungs every 6 (six) hours as needed for wheezing or shortness of breath., Disp: 6.7 g, Rfl: 0   amLODipine (NORVASC) 5 MG tablet, Take 1 tablet (5 mg total) by mouth daily., Disp: 30  tablet, Rfl: 0   anastrozole (ARIMIDEX) 1 MG tablet, Take 1 tablet (1 mg total) by mouth daily., Disp: 30 tablet, Rfl: 0   DULoxetine (CYMBALTA) 20 MG capsule, Take 1 capsule (20 mg total) by mouth 2 (two) times daily., Disp: 60 capsule, Rfl: 0   gabapentin (NEURONTIN) 600 MG tablet, Take 1 tablet (600 mg total) by mouth 2 (two) times daily., Disp: 60 tablet, Rfl: 0   guaiFENesin-dextromethorphan (ROBITUSSIN DM) 100-10 MG/5ML syrup, Take 5 mLs by mouth every 4 (four) hours as needed for up to 14 days for cough., Disp: 118 mL, Rfl: 0   hydrOXYzine (VISTARIL) 50 MG capsule, Take 1 capsule (50 mg total) by mouth at bedtime as needed., Disp: 30 capsule, Rfl: 0   metFORMIN (GLUCOPHAGE) 500 MG tablet, Take 1 tablet (500 mg total) by mouth daily with breakfast., Disp: 30 tablet, Rfl: 0   OVER THE COUNTER MEDICATION, Take 1-2 tablets by mouth 2 (two) times daily as needed (for pain). OTC Pain Reliever PM, Disp: , Rfl:    prazosin (MINIPRESS) 1 MG capsule, Take 1 capsule (1 mg total) by mouth at bedtime., Disp: 30 capsule, Rfl: 0   rosuvastatin (CRESTOR) 20 MG tablet, Take 1 tablet (20 mg total) by mouth daily., Disp: 30 tablet, Rfl: 0   SYMBICORT 160-4.5 MCG/ACT inhaler, Inhale 2 puffs into the lungs in the morning and at bedtime., Disp: 6 g, Rfl: 0   tiZANidine (ZANAFLEX) 4 MG tablet, Take 1 tablet (4 mg total) by mouth every 8 (eight) hours as needed for up to 14 days., Disp: 30 tablet, Rfl: 0  Allergies: Allergies  Allergen Reactions   Morphine And Codeine Itching   Ibuprofen Itching    Mcneil Sober, NP

## 2023-02-11 NOTE — ED Notes (Signed)
vol/moved to bhu/reassess at a later time/psych will follow-up.

## 2023-02-11 NOTE — ED Notes (Signed)
Pt woke up coughing choked up on phlegm stuck in her throat. Once pt quit coughing pt was provided water to drink. Pt is back to sleeping.

## 2023-02-11 NOTE — ED Notes (Signed)
vol/pending consult.. 

## 2023-02-11 NOTE — ED Notes (Addendum)
Checked patient's blood sugar due to diagnosis of DM and she will sit up and talk, laugh and then just lay down and start snoring, she told nurse " I have these spells at times, "  will continue to monitor and let MD be aware of her symptoms.

## 2023-02-11 NOTE — ED Notes (Signed)
 Pt provided with dinner tray.

## 2023-02-11 NOTE — ED Notes (Signed)
pt recieved snack and drink 

## 2023-02-11 NOTE — ED Notes (Signed)
 Hospital meal provided, pt tolerated w/o complaints.  Waste discarded appropriately.

## 2023-02-11 NOTE — ED Provider Notes (Signed)
Emergency Medicine Observation Re-evaluation Note  Caitlin Rivera is a 68 y.o. female, seen on rounds today.  Pt initially presented to the ED for complaints of Psychiatric Evaluation Currently, the patient is resting.  Physical Exam  BP 112/61 (BP Location: Left Arm)   Pulse 79   Temp 98.6 F (37 C) (Oral)   Resp 20   SpO2 91%   General: No distress   ED Course / MDM  EKG:   I have reviewed the labs performed to date as well as medications administered while in observation.  Recent changes in the last 24 hours include .  Plan  Current plan is for placement.    Delton Prairie, MD 02/11/23 [phone]
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
## 2023-02-11 NOTE — ED Notes (Signed)
NP is talking with the Patient at this time, tele-psych. Patient is remaining calm at this time.

## 2023-02-11 NOTE — ED Notes (Signed)
 Attempted to call report, no answer

## 2023-02-11 NOTE — ED Notes (Signed)
PT VOL/Patient is unable to participate in assessment at this time. Patient unable to be awaken. Psych will continue follow-up.

## 2023-02-12 ENCOUNTER — Encounter: Payer: Self-pay | Admitting: Psychiatry

## 2023-02-12 DIAGNOSIS — F332 Major depressive disorder, recurrent severe without psychotic features: Secondary | ICD-10-CM | POA: Diagnosis not present

## 2023-02-12 MED ORDER — HYDROXYZINE HCL 50 MG PO TABS
50.0000 mg | ORAL_TABLET | Freq: Every day | ORAL | Status: DC
  Administered 2023-02-12 – 2023-02-15 (×4): 50 mg via ORAL
  Filled 2023-02-12 (×4): qty 1

## 2023-02-12 NOTE — Group Note (Signed)
Date:  02/12/2023 Time:  11:13 PM  Group Topic/Focus:  Wrap-Up Group:   The focus of this group is to help patients review their daily goal of treatment and discuss progress on daily workbooks.    Participation Level:  Minimal  Participation Quality:  Appropriate  Affect:  Appropriate  Cognitive:  Appropriate  Insight: Good  Engagement in Group:  Engaged  Modes of Intervention:  Discussion  Additional Comments:    Maeola Harman 02/12/2023, 11:13 PM

## 2023-02-12 NOTE — Plan of Care (Signed)
D: Pt alert and oriented. Pt denies experiencing any anxiety/depression at this time. Pt reports experiencing chronic back pain at this time, prn medication given. Pt denies experiencing any SI/HI, or AVH at this time.   A: Scheduled medications administered to pt, per MD orders. Support and encouragement provided. Frequent verbal contact made. Routine safety checks conducted q15 minutes.   R: No adverse drug reactions noted. Pt verbally contracts for safety at this time. Pt compliant with medications and treatment plan. Pt interacts poorly with others on the unit. Pt remains safe at this time. Plan of care ongoing.  Pt did not receive amlodipine d/t low BP. Pt refused breo ellipta. Pt stated that she is unable to inhale/suck in the medication. MD notified and aware of the previous stated.   Pt provided cell phone to retreive numbers from with staff present.  Problem: Activity: Goal: Interest or engagement in leisure activities will improve Outcome: Not Progressing   Problem: Role Relationship: Goal: Will demonstrate positive changes in social behaviors and relationships Outcome: Not Progressing

## 2023-02-12 NOTE — BHH Suicide Risk Assessment (Signed)
[facility] Admission Suicide Risk Assessment   Nursing information obtained from:    Demographic factors:  Age 68 or older Current Mental Status:  NA Loss Factors:  Loss of significant relationship, Financial problems / change in socioeconomic status Historical Factors:  Impulsivity Risk Reduction Factors:  NA  Total Time spent with patient: 30 minutes Principal Problem: MDD (major depressive disorder), recurrent severe, without psychosis (HCC) Diagnosis:  Principal Problem:   MDD (major depressive disorder), recurrent severe, without psychosis (HCC) Active Problems:   Depression  Subjective Data: 68 year old female admitted to the unit voluntarily for concerns having a nervious breakdown and SI without a plan. Reports using crack cocaine x 5 days and homelessness. Upon admission, pt is irritable and agitated. Angry affect. Uncooperative with care. Pt is loud, demanding and verbally aggressive. During, the admission process pt became upset concerning personal belongings and medications. Pt screaming and hollering, banging on dayroom window and slamming doors.   Continued Clinical Symptoms:  Alcohol Use Disorder Identification Test Final Score (AUDIT): 0 The "Alcohol Use Disorders Identification Test", Guidelines for Use in Primary Care, Second Edition.  World Science writer [facility]). Score between 0-7:  no or low risk or alcohol related problems. Score between 8-15:  moderate risk of alcohol related problems. Score between 16-19:  high risk of alcohol related problems. Score 20 or above:  warrants further diagnostic evaluation for alcohol dependence and treatment.   CLINICAL FACTORS:   Depression:   Hopelessness Impulsivity   Musculoskeletal: Strength & Muscle Tone: decreased Gait & Station: unsteady Patient leans: N/A  Psychiatric Specialty Exam:  Presentation  General Appearance:  Disheveled  Eye Contact: Fleeting  Speech: Slow  Speech  Volume: Decreased  Handedness: Right   Mood and Affect  Mood: Labile  Affect: Depressed; Labile   Thought Process  Thought Processes: Irrevelant  Descriptions of Associations:Circumstantial  Orientation:Partial  Thought Content:Illogical  History of Schizophrenia/Schizoaffective disorder:No data recorded Duration of Psychotic Symptoms:No data recorded Hallucinations:Hallucinations: Other (comment)  Ideas of Reference:None  Suicidal Thoughts:Suicidal Thoughts: Yes, Active SI Active Intent and/or Plan: Without Plan; Without Intent  Homicidal Thoughts:Homicidal Thoughts: No   Sensorium  Memory: Immediate Fair; Recent Fair; Remote Poor  Judgment: Impaired  Insight: Shallow   Executive Functions  Concentration: Poor  Attention Span: Poor  Recall: [employer] of Knowledge: Fair  Language: Fair   Psychomotor Activity  Psychomotor Activity: Psychomotor Activity: Normal   Assets  Assets: Communication Skills; Desire for Improvement; Resilience   Sleep  Sleep: Sleep: Poor    Physical Exam: Physical Exam ROS Blood pressure (!) 115/53, pulse 67, temperature 97.9 F (36.6 C), resp. rate 16, height 5\' 6"  (1.676 m), weight 104.6 kg, SpO2 (!) 88%. Body mass index is 37.2 kg/m.   COGNITIVE FEATURES THAT CONTRIBUTE TO RISK:  Thought constriction (tunnel vision)    SUICIDE RISK:   Moderate:  Frequent suicidal ideation with limited intensity, and duration, some specificity in terms of plans, no associated intent, good self-control, limited dysphoria/symptomatology, some risk factors present, and identifiable protective factors, including available and accessible social support.  PLAN OF CARE: Patient is currently admitted to inpatient psychiatric hospital with every 15 minute safety checks.  Multidisciplinary treatment team is of her.  Recommended to participate in medication management, milieu/group therapy.  I certify that inpatient  services furnished can reasonably be expected to improve the patient's condition.   Verner Chol, MD 02/12/2023, 1:42 PM
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
## 2023-02-12 NOTE — Progress Notes (Signed)
Admission Note: 68 year old female admitted to the unit voluntarily for concerns  having a nervious breakdown and SI without a plan. Reports using crack cocaine x 5 days and homelessness. Upon admission, pt is irritable and agitated. Angry affect. Uncooperative with care. Pt is loud, demanding and verbally aggressive.  During, the admission process pt became upset concerning personal belongings and medications. Pt screaming and hollering, banging on dayroom window and slamming doors. Po medications given as scheduled per MD order. PRN given for agitation. Tol well. Dx MDD, HTN, COPD, Asthma, DM, Breast CA. UDS +cocaine and THC. Pt brought in a large bag of her own medications Metformin 500 mg, Anastrozole 1 mg, Symbicort inhaler, Duloxetine 20 mg, Norvasc 5 mg, Gabapentin 600 mg, vistaril 50 mg, Prazosin 1 mg, Cough syrup one bottle, Zanaflex 4 mg. Pt medications sent to pharmacy. Pt searched with HCT and no contraband found. . Ecchymosis lower abd due to insulin injections per pt. Skin warm, dry and intact. POC and unit policies explained. Consents obtained. Food and fluids offered and accepted. Denies SI/HI/AVH. Q 15 min checks maintained for safety. Pt resting quietly in bed at this time.

## 2023-02-12 NOTE — Plan of Care (Signed)
Patient is new to the unit haven't time to progress.   Problem: Education: Goal: Knowledge of General Education information will improve Description: Including pain rating scale, medication(s)/side effects and non-pharmacologic comfort measures Outcome: Not Progressing   Problem: Health Behavior/Discharge Planning: Goal: Ability to manage health-related needs will improve Outcome: Not Progressing   Problem: Clinical Measurements: Goal: Ability to maintain clinical measurements within normal limits will improve Outcome: Not Progressing Goal: Will remain free from infection Outcome: Not Progressing Goal: Diagnostic test results will improve Outcome: Not Progressing Goal: Respiratory complications will improve Outcome: Not Progressing Goal: Cardiovascular complication will be avoided Outcome: Not Progressing   Problem: Nutrition: Goal: Adequate nutrition will be maintained Outcome: Not Progressing   Problem: Coping: Goal: Level of anxiety will decrease Outcome: Not Progressing   Problem: Pain Managment: Goal: General experience of comfort will improve and/or be controlled Outcome: Not Progressing   Problem: Safety: Goal: Ability to remain free from injury will improve Outcome: Not Progressing   Problem: Skin Integrity: Goal: Risk for impaired skin integrity will decrease Outcome: Not Progressing

## 2023-02-12 NOTE — H&P (Signed)
Psychiatric Admission Assessment Adult  Patient Identification: Caitlin Rivera MRN:  [id number] Date of Evaluation:  02/12/2023 Chief Complaint:  Depression [F32.A]   History of Present Illness: 68 year old female admitted to the unit voluntarily for concerns having a nervious breakdown and SI without a plan. Reports using crack cocaine x 5 days and homelessness. Upon admission, pt is irritable and agitated. Angry affect. Uncooperative with care. Pt is loud, demanding and verbally aggressive. During, the admission process pt became upset concerning personal belongings and medications. Pt screaming and hollering, banging on dayroom window and slamming doors.  Patient received as needed medications last night. Today on interview patient is noted to be sitting in the dayroom.  She followed the verbal commands and walked to her room to talk to this provider.  She is able to tell her name and date of birth.  She is able to tell the month as February and the year is 2025.  She reports that she was about to lose it and that is why she came to the emergency room.  She reports that she has no place to go or live and she has no family support and she has been feeling depressed.  She reports feeling hopeless and worthless and reports anhedonia.  She reports poor sleep but fair energy motivation and appetite.  She reports feeling anxious and having panic attacks.  She reports hearing voices of dead people like her mom talking to her but denies visual hallucinations.  She denies any history of abuse and denies nightmares or flashbacks.  She denies racing thoughts and is not displaying any overt delusions.  She did acknowledge that she smoked crack and has been homeless due to conflict with her family members.  She refused to do any contact information about her family members to get collateral information.  Total Time spent with patient: 1 hour Sleep  Sleep:Sleep: Poor  Past Psychiatric History:  Psychiatric  History:  Information collected from patient  Prev Dx/Sx: Depression Current Psych Provider: None reported Home Meds (current): Unable to recall Previous Med Trials: Unable to recall Therapy: None reported  Prior Psych Hospitalization: None reported Prior Self Harm: None reported Prior Violence: None reported  Family Psych History: Multiple family members with mental health history and substance use history both maternal and paternal side Family Hx suicide: None reported  Social History:  Developmental Hx: Normal development Educational Hx: 10th grade Occupational Hx: Currently not working gets Facilities manager Hx: None reported Living Situation: Reports to be homeless and is not in communication with any of her family members including her children but reports having 3 children Spiritual Hx: None reported Access to weapons/lethal means: None reported  Substance History Alcohol: Consistently denies Type of alcohol none reported Last Drink none reported Number of drinks per day none reported History of alcohol withdrawal seizures none reported History of DT's none reported Tobacco: 1 pack/day for many years Illicit drugs: Use of crack for many years, last use was 3 days ago of unknown quantity,  Prescription drug abuse: None reported Rehab hx: None reported Is the patient at risk to self? Yes.    Has the patient been a risk to self in the past 6 months? No.  Has the patient been a risk to self within the distant past? No.  Is the patient a risk to others? No.  Has the patient been a risk to others in the past 6 months? No.  Has the patient been a risk to others  within the distant past? No.   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   COPD (chronic obstructive pulmonary disease) (HCC)    smoker   Diabetes mellitus without complication (HCC)    Fibroids    Hypertension    Personal history of radiation therapy    Smoker     Past Surgical History:  Procedure Laterality Date   BREAST BIOPSY Bilateral 06/03/2019   BREAST BIOPSY Left 06/14/2019   BREAST EXCISIONAL BIOPSY Right 07/2019   BREAST LUMPECTOMY Left 07/2019   BREAST LUMPECTOMY WITH RADIOACTIVE SEED LOCALIZATION Bilateral 07/31/2019   Procedure: BILATERAL BREAST LUMPECTOMY WITH RADIOACTIVE SEED LOCALIZATION;  Surgeon: Griselda Miner, MD;  Location: [facility];  Service: General;  Laterality: Bilateral;   TUBAL LIGATION     TUBAL LIGATION     Family History:  Family History  Problem Relation Age of Onset   Cancer Mother     Social History:  Social History   Substance and Sexual Activity  Alcohol Use No     Social History   Substance and Sexual Activity  Drug Use Yes   Types: "Crack" cocaine   Comment: crack-last smoked 2 wks ago-07-24-19, states she uses once a month      Allergies:   Allergies  Allergen Reactions   Morphine And Codeine Itching   Ibuprofen Itching   Lab Results:  Results for orders placed or performed during the hospital encounter of 02/10/23 (from the past 48 hours)  Urine Drug Screen, Qualitative     Status: Abnormal   Collection Time: 02/10/23 10:00 PM  Result Value Ref Range   Tricyclic, Ur Screen NONE DETECTED NONE DETECTED   Amphetamines, Ur Screen NONE DETECTED NONE DETECTED   MDMA (Ecstasy)Ur Screen NONE DETECTED NONE DETECTED   Cocaine Metabolite,Ur [place] POSITIVE (A) NONE DETECTED   Opiate, Ur Screen NONE DETECTED NONE DETECTED   Phencyclidine (PCP) Ur S NONE DETECTED NONE DETECTED   Cannabinoid 50 Ng, Ur [place] POSITIVE (A) NONE  DETECTED   Barbiturates, Ur Screen NONE DETECTED NONE DETECTED   Benzodiazepine, Ur Scrn NONE DETECTED NONE DETECTED   Methadone Scn, Ur NONE DETECTED NONE DETECTED    Comment: (NOTE) Tricyclics + metabolites, urine    Cutoff 1000 ng/mL Amphetamines + metabolites, urine  Cutoff 1000 ng/mL MDMA (Ecstasy), urine              Cutoff 500 ng/mL Cocaine Metabolite, urine          Cutoff 300 ng/mL Opiate + metabolites, urine        Cutoff 300 ng/mL Phencyclidine (PCP), urine         Cutoff 25 ng/mL Cannabinoid, urine                 Cutoff 50 ng/mL Barbiturates + metabolites, urine  Cutoff 200 ng/mL Benzodiazepine, urine              Cutoff 200 ng/mL Methadone, urine                   Cutoff  300 ng/mL  The urine drug screen provides only a preliminary, unconfirmed analytical test result and should not be used for non-medical purposes. Clinical consideration and professional judgment should be applied to any positive drug screen result due to possible interfering substances. A more specific alternate chemical method must be used in order to obtain a confirmed analytical result. Gas chromatography / mass spectrometry (GC/MS) is the preferred confirm atory method. Performed at [facility], [address] [place], [place] [address]   Comprehensive metabolic panel     Status: Abnormal   Collection Time: 02/10/23 10:11 PM  Result Value Ref Range   Sodium 139 135 - 145 mmol/L   Potassium 3.5 3.5 - 5.1 mmol/L   Chloride 103 98 - 111 mmol/L   CO2 25 22 - 32 mmol/L   Glucose, Bld 233 (H) 70 - 99 mg/dL    Comment: Glucose reference range applies only to samples taken after fasting for at least 8 hours.   BUN 26 (H) 8 - 23 mg/dL   Creatinine, Ser 6.29 0.44 - 1.00 mg/dL   Calcium 8.5 (L) 8.9 - 10.3 mg/dL   Total Protein 6.9 6.5 - 8.1 g/dL   Albumin 3.8 3.5 - 5.0 g/dL   AST 21 15 - 41 U/L   ALT 31 0 - 44 U/L   Alkaline Phosphatase 72 38 - 126 U/L   Total Bilirubin 0.8 0.0 - 1.2  mg/dL   GFR, Estimated >52 >84 mL/min    Comment: (NOTE) Calculated using the CKD-EPI Creatinine Equation (2021)    Anion gap 11 5 - 15    Comment: Performed at [facility], [address] [place], [place] [address]  Ethanol     Status: None   Collection Time: 02/10/23 10:11 PM  Result Value Ref Range   Alcohol, Ethyl (B) <10 <10 mg/dL    Comment: (NOTE) Lowest detectable limit for serum alcohol is 10 mg/dL.  For medical purposes only. Performed at [facility], [address] [place], [place] [address]   Salicylate level     Status: Abnormal   Collection Time: 02/10/23 10:11 PM  Result Value Ref Range   Salicylate Lvl <7.0 (L) 7.0 - 30.0 mg/dL    Comment: Performed at [facility], [address] [place], [place] [address]  Acetaminophen level     Status: Abnormal   Collection Time: 02/10/23 10:11 PM  Result Value Ref Range   Acetaminophen (Tylenol), Serum <10 (L) 10 - 30 ug/mL    Comment: (NOTE) Therapeutic concentrations vary significantly. A range of 10-30 ug/mL  may be an effective concentration for many patients. However, some  are best treated at concentrations outside of this range. Acetaminophen concentrations >150 ug/mL at 4 hours after ingestion  and >50 ug/mL at 12 hours after ingestion are often associated with  toxic reactions.  Performed at [facility], [address] [place], [place] [address]   cbc     Status: Abnormal   Collection Time: 02/10/23 10:11 PM  Result Value Ref Range   WBC 8.6 4.0 - 10.5 K/uL   RBC 5.14 (H) 3.87 - 5.11 MIL/uL   Hemoglobin 15.1 (H) 12.0 - 15.0 g/dL   HCT 74.2 (H) 59.5 - 63.8 %   MCV 93.6 80.0 - 100.0 fL   MCH 29.4 26.0 - 34.0 pg   MCHC 31.4 30.0 - 36.0 g/dL   RDW 75.6 43.3 - 29.5 %   Platelets 283 150 - 400 K/uL  nRBC 0.0 0.0 - 0.2 %    Comment: Performed at [facility], [address] [place], [place] [address]  CBG monitoring, ED     Status: Abnormal    Collection Time: 02/11/23 12:30 PM  Result Value Ref Range   Glucose-Capillary 143 (H) 70 - 99 mg/dL    Comment: Glucose reference range applies only to samples taken after fasting for at least 8 hours.  Resp panel by RT-PCR (RSV, Flu A&B, Covid) Anterior Nasal Swab     Status: None   Collection Time: 02/11/23  2:13 PM   Specimen: Anterior Nasal Swab  Result Value Ref Range   SARS Coronavirus 2 by RT PCR NEGATIVE NEGATIVE    Comment: (NOTE) SARS-CoV-2 target nucleic acids are NOT DETECTED.  The SARS-CoV-2 RNA is generally detectable in upper respiratory specimens during the acute phase of infection. The lowest concentration of SARS-CoV-2 viral copies this assay can detect is 138 copies/mL. A negative result does not preclude SARS-Cov-2 infection and should not be used as the sole basis for treatment or other patient management decisions. A negative result may occur with  improper specimen collection/handling, submission of specimen other than nasopharyngeal swab, presence of viral mutation(s) within the areas targeted by this assay, and inadequate number of viral copies(<138 copies/mL). A negative result must be combined with clinical observations, patient history, and epidemiological information. The expected result is Negative.  Fact Sheet for Patients:  BloggerCourse.com  Fact Sheet for Healthcare Providers:  SeriousBroker.it  This test is no t yet approved or cleared by the [place] FDA and  has been authorized for detection and/or diagnosis of SARS-CoV-2 by FDA under an Emergency Use Authorization (EUA). This EUA will remain  in effect (meaning this test can be used) for the duration of the COVID-19 declaration under Section 564(b)(1) of the Act, 21 U.S.C.section 360bbb-3(b)(1), unless the authorization is terminated  or revoked sooner.       Influenza A by PCR NEGATIVE NEGATIVE   Influenza B by PCR NEGATIVE NEGATIVE     Comment: (NOTE) The Xpert Xpress SARS-CoV-2/FLU/RSV plus assay is intended as an aid in the diagnosis of influenza from Nasopharyngeal swab specimens and should not be used as a sole basis for treatment. Nasal washings and aspirates are unacceptable for Xpert Xpress SARS-CoV-2/FLU/RSV testing.  Fact Sheet for Patients: BloggerCourse.com  Fact Sheet for Healthcare Providers: SeriousBroker.it  This test is not yet approved or cleared by the [place] FDA and has been authorized for detection and/or diagnosis of SARS-CoV-2 by FDA under an Emergency Use Authorization (EUA). This EUA will remain in effect (meaning this test can be used) for the duration of the COVID-19 declaration under Section 564(b)(1) of the Act, 21 U.S.C. section 360bbb-3(b)(1), unless the authorization is terminated or revoked.     Resp Syncytial Virus by PCR NEGATIVE NEGATIVE    Comment: (NOTE) Fact Sheet for Patients: BloggerCourse.com  Fact Sheet for Healthcare Providers: SeriousBroker.it  This test is not yet approved or cleared by the [place] FDA and has been authorized for detection and/or diagnosis of SARS-CoV-2 by FDA under an Emergency Use Authorization (EUA). This EUA will remain in effect (meaning this test can be used) for the duration of the COVID-19 declaration under Section 564(b)(1) of the Act, 21 U.S.C. section 360bbb-3(b)(1), unless the authorization is terminated or revoked.  Performed at [facility], [address], [place], [place] [address]     Blood Alcohol level:  Lab Results  Component Value Date  ETH <10 02/10/2023    Metabolic Disorder Labs:  Lab Results  Component Value Date   HGBA1C 6.6 (H) 02/03/2023   MPG 142.72 02/03/2023   No results found for: "PROLACTIN" No results found for: "CHOL", "TRIG", "HDL", "CHOLHDL", "VLDL", "LDLCALC"  Current  Medications: Current Facility-Administered Medications  Medication Dose Route Frequency Provider Last Rate Last Admin   acetaminophen (TYLENOL) tablet 650 mg  650 mg Oral Q6H PRN Penn, Cranston Neighbor, NP   650 mg at 02/11/23 2238   alum & mag hydroxide-simeth (MAALOX/MYLANTA) 200-200-20 MG/5ML suspension 30 mL  30 mL Oral Q4H PRN Penn, Cicely, NP       amLODipine (NORVASC) tablet 5 mg  5 mg Oral Daily Penn, Cicely, NP       anastrozole (ARIMIDEX) tablet 1 mg  1 mg Oral Daily Penn, Cicely, NP   1 mg at 02/12/23 0901   DULoxetine (CYMBALTA) DR capsule 20 mg  20 mg Oral BID Penn, Cranston Neighbor, NP   20 mg at 02/12/23 0901   fluticasone furoate-vilanterol (BREO ELLIPTA) 200-25 MCG/ACT 1 puff  1 puff Inhalation Daily Penn, Cicely, NP       gabapentin (NEURONTIN) capsule 600 mg  600 mg Oral BID Penn, Cranston Neighbor, NP   600 mg at 02/12/23 0901   magnesium hydroxide (MILK OF MAGNESIA) suspension 30 mL  30 mL Oral Daily PRN Penn, Cranston Neighbor, NP       metFORMIN (GLUCOPHAGE) tablet 500 mg  500 mg Oral Q breakfast Penn, Cicely, NP   500 mg at 02/12/23 0900   OLANZapine zydis (ZYPREXA) disintegrating tablet 5 mg  5 mg Oral TID PRN Mcneil Sober, NP   5 mg at 02/12/23 1610   prazosin (MINIPRESS) capsule 1 mg  1 mg Oral QHS Penn, Cicely, NP   1 mg at 02/11/23 2237   rosuvastatin (CRESTOR) tablet 20 mg  20 mg Oral Daily Penn, Cranston Neighbor, NP   20 mg at 02/12/23 0901   tiZANidine (ZANAFLEX) tablet 4 mg  4 mg Oral Q8H PRN Mcneil Sober, NP   4 mg at 02/12/23 0900   PTA Medications: Medications Prior to Admission  Medication Sig Dispense Refill Last Dose/Taking   acetaminophen (TYLENOL 8 HOUR) 650 MG CR tablet Take 1 tablet (650 mg total) by mouth every 8 (eight) hours as needed for pain or fever. 30 tablet 0    albuterol (VENTOLIN HFA) 108 (90 Base) MCG/ACT inhaler Inhale 1-2 puffs into the lungs every 6 (six) hours as needed for wheezing or shortness of breath. 6.7 g 0    amLODipine (NORVASC) 5 MG tablet Take 1 tablet (5 mg total) by mouth  daily. 30 tablet 0    anastrozole (ARIMIDEX) 1 MG tablet Take 1 tablet (1 mg total) by mouth daily. 30 tablet 0    DULoxetine (CYMBALTA) 20 MG capsule Take 1 capsule (20 mg total) by mouth 2 (two) times daily. 60 capsule 0    gabapentin (NEURONTIN) 600 MG tablet Take 1 tablet (600 mg total) by mouth 2 (two) times daily. 60 tablet 0    guaiFENesin-dextromethorphan (ROBITUSSIN DM) 100-10 MG/5ML syrup Take 5 mLs by mouth every 4 (four) hours as needed for up to 14 days for cough. 118 mL 0    hydrOXYzine (VISTARIL) 50 MG capsule Take 1 capsule (50 mg total) by mouth at bedtime as needed. 30 capsule 0    metFORMIN (GLUCOPHAGE) 500 MG tablet Take 1 tablet (500 mg total) by mouth daily with breakfast. 30 tablet 0    OVER THE  COUNTER MEDICATION Take 1-2 tablets by mouth 2 (two) times daily as needed (for pain). OTC Pain Reliever PM      prazosin (MINIPRESS) 1 MG capsule Take 1 capsule (1 mg total) by mouth at bedtime. 30 capsule 0    rosuvastatin (CRESTOR) 20 MG tablet Take 1 tablet (20 mg total) by mouth daily. 30 tablet 0    SYMBICORT 160-4.5 MCG/ACT inhaler Inhale 2 puffs into the lungs in the morning and at bedtime. 6 g 0    tiZANidine (ZANAFLEX) 4 MG tablet Take 1 tablet (4 mg total) by mouth every 8 (eight) hours as needed for up to 14 days. 30 tablet 0     Psychiatric Specialty Exam:  Presentation  General Appearance:  Disheveled  Eye Contact: Fleeting  Speech: Slow  Speech Volume: Decreased    Mood and Affect  Mood: Labile  Affect: Depressed; Labile   Thought Process  Thought Processes: Irrevelant  Descriptions of Associations:Circumstantial  Orientation:Partial  Thought Content:Illogical  Hallucinations:Hallucinations: Other (comment) Auditory hallucinations of her dead familymembers Ideas of Reference:None  Suicidal Thoughts:Suicidal Thoughts: Yes, Active SI Active Intent and/or Plan: Without Plan; Without Intent  Homicidal Thoughts:Homicidal Thoughts:  No   Sensorium  Memory: Immediate Fair; Recent Fair; Remote Poor  Judgment: Impaired  Insight: Shallow   Executive Functions  Concentration: Poor  Attention Span: Poor  Recall: [employer] of Knowledge: Fair  Language: Fair   Psychomotor Activity  Psychomotor Activity: Psychomotor Activity: Normal   Assets  Assets: Communication Skills; Desire for Improvement; Resilience    Musculoskeletal: Strength & Muscle Tone: decreased Gait & Station: unsteady  Physical Exam: Physical Exam HENT:     Head: Normocephalic.     Nose: Nose normal.     Mouth/Throat:     Mouth: Mucous membranes are moist.  Eyes:     Pupils: Pupils are equal, round, and reactive to light.  Cardiovascular:     Rate and Rhythm: Normal rate.     Pulses: Normal pulses.  Pulmonary:     Breath sounds: Normal breath sounds.  Abdominal:     General: Bowel sounds are normal.  Skin:    General: Skin is warm.  Neurological:     General: No focal deficit present.     Mental Status: She is alert.    Review of Systems  Constitutional: Negative.   HENT: Negative.    Eyes: Negative.   Respiratory: Negative.    Cardiovascular: Negative.   Gastrointestinal: Negative.   Skin: Negative.   Neurological: Negative.    Blood pressure (!) 115/53, pulse 67, temperature 97.9 F (36.6 C), resp. rate 16, height 5\' 6"  (1.676 m), weight 104.6 kg, SpO2 (!) 88%. Body mass index is 37.2 kg/m.  Principal Diagnosis: MDD (major depressive disorder), recurrent severe, without psychosis (HCC) Diagnosis:  Principal Problem:   MDD (major depressive disorder), recurrent severe, without psychosis (HCC) Active Problems:   Depression   Clinical Decision Making: 68 year old female with history of mood disorder, cocaine use currently admitted for suicidal statements and aggressive behaviors in the emergency room.  Patient is homeless, no family contacts, not following up with any outpatient psychiatrist,  reports feeling depressed with suicidal thoughts.  On the unit she is noted to be impulsive and aggressive towards the staff, destroying property on the unit.  She needs inpatient hospitalization for further stabilization.  Treatment Plan Summary:  Safety and Monitoring:             -- Involuntary admission to inpatient psychiatric unit  for safety, stabilization and treatment             -- Daily contact with patient to assess and evaluate symptoms and progress in treatment             -- Patient's case to be discussed in multi-disciplinary team meeting             -- Observation Level: q15 minute checks             -- Vital signs:  q12 hours             -- Precautions: suicide, elopement, and assault   2. Psychiatric Diagnoses and Treatment:              Continue Cymbalta 20 mg twice daily and prazosin 1 mg daily If patient remains agitated-will consider a mood stabilizer     -- The risks/benefits/side-effects/alternatives to this medication were discussed in detail with the patient and time was given for questions. The patient consents to medication trial.                -- Metabolic profile and EKG monitoring obtained while on an atypical antipsychotic (BMI: Lipid Panel: HbgA1c: QTc:)              -- Encouraged patient to participate in unit milieu and in scheduled group therapies                            3. Medical Issues Being Addressed:    No urgent medical needs noted at this time 4. Discharge Planning:              -- Social work and case management to assist with discharge planning and identification of hospital follow-up needs prior to discharge             -- Estimated LOS: 5-7 days             -- Discharge Concerns: Need to establish a safety plan; Medication compliance and effectiveness             -- Discharge Goals: Return home with outpatient referrals follow ups  Physician Treatment Plan for Primary Diagnosis: MDD (major depressive disorder), recurrent severe,  without psychosis (HCC) Long Term Goal(s): Improvement in symptoms so as ready for discharge  Short Term Goals: Ability to identify changes in lifestyle to reduce recurrence of condition will improve, Ability to verbalize feelings will improve, Ability to disclose and discuss suicidal ideas, Ability to maintain clinical measurements within normal limits will improve, Compliance with prescribed medications will improve, and Ability to identify triggers associated with substance abuse/mental health issues will improve  Physician Treatment Plan for Secondary Diagnosis: Principal Problem:   MDD (major depressive disorder), recurrent severe, without psychosis (HCC) Active Problems:   Depression  Long Term Goal(s): Improvement in symptoms so as ready for discharge  Short Term Goals: Ability to identify changes in lifestyle to reduce recurrence of condition will improve, Ability to verbalize feelings will improve, Ability to disclose and discuss suicidal ideas, Ability to demonstrate self-control will improve, Ability to identify and develop effective coping behaviors will improve, Ability to maintain clinical measurements within normal limits will improve, Compliance with prescribed medications will improve, and Ability to identify triggers associated with substance abuse/mental health issues will improve  I certify that inpatient services furnished can reasonably be expected to improve the patient's condition.    Verner Chol, MD 2/2/20251:45 PM

## 2023-02-12 NOTE — Tx Team (Signed)
Initial Treatment Plan 02/12/2023 12:28 AM [name] [name] WUJ:[id number]    PATIENT STRESSORS: Financial difficulties   Marital or family conflict   Substance abuse   Traumatic event     PATIENT STRENGTHS: Ability for insight  Average or above average intelligence  Motivation for treatment/growth    PATIENT IDENTIFIED PROBLEMS: Substance abuse  anxiety  depression                 DISCHARGE CRITERIA:  Improved stabilization in mood, thinking, and/or behavior Verbal commitment to aftercare and medication compliance  PRELIMINARY DISCHARGE PLAN: Attend aftercare/continuing care group Return to previous living arrangement  PATIENT/FAMILY INVOLVEMENT: This treatment plan has been presented to and reviewed with the patient, Caitlin Rivera.  The patient and family have been given the opportunity to ask questions and make suggestions.  Foster Simpson, RN 02/12/2023, 12:28 AM

## 2023-02-13 DIAGNOSIS — F332 Major depressive disorder, recurrent severe without psychotic features: Secondary | ICD-10-CM | POA: Diagnosis not present

## 2023-02-13 MED ORDER — ALBUTEROL SULFATE HFA 108 (90 BASE) MCG/ACT IN AERS
1.0000 | INHALATION_SPRAY | Freq: Four times a day (QID) | RESPIRATORY_TRACT | Status: DC | PRN
  Administered 2023-02-13 – 2023-02-14 (×2): 2 via RESPIRATORY_TRACT
  Filled 2023-02-13: qty 6.7

## 2023-02-13 NOTE — Progress Notes (Signed)
Patient admitted voluntarily on 02.01.25 to the [facility] Psych floor for complaints of having a "nervous breakdown", SI without a plan, and admission of cocaine use.  Patient denies SI/HI/AVH. She presents to treatment team irritable and upset. "I only need help with finding a home, nothing else. None of this other stuff matters." Support and encouragement provided. Patient later became less irritable and was seen positively interacting with peers during groups. No physical complaints voiced or noted.  Q15 minute unit checks in place.

## 2023-02-13 NOTE — Plan of Care (Signed)
   Problem: Education: Goal: Utilization of techniques to improve thought processes will improve Outcome: Progressing Goal: Knowledge of the prescribed therapeutic regimen will improve Outcome: Progressing

## 2023-02-13 NOTE — Progress Notes (Signed)
Pt given rescue inhaler. Sats at 97 currently. Were as high as 99. Pt is more calm now.

## 2023-02-13 NOTE — Group Note (Unsigned)
Date:  02/14/2023 Time:  12:05 AM  Group Topic/Focus:  Wrap-Up Group:   The focus of this group is to help patients review their daily goal of treatment and discuss progress on daily workbooks.    Participation Level:  Active  Participation Quality:  Appropriate  Affect:  Appropriate  Cognitive:  Appropriate  Insight: Appropriate  Engagement in Group:  Engaged  Modes of Intervention:  Discussion  Additional Comments:    Maeola Harman 02/14/2023, 12:05 AM

## 2023-02-13 NOTE — Group Note (Signed)
Recreation Therapy Group Note   Group Topic:Coping Skills  Group Date: 02/13/2023 Start Time: 1100 End Time: 1145 Facilitators: Rosina Lowenstein, LRT, CTRS Location:  Dayroom  Group Description: Mind Map.  Patient was provided a blank template of a diagram with 32 blank boxes in a tiered system, branching from the center (similar to a bubble chart). LRT directed patients to label the middle of the diagram "Coping Skills". LRT and patients then came up with 8 different coping skills as examples. Pt were directed to record their coping skills in the 2nd tier boxes closest to the center.  Patients would then share their coping skills with the group as LRT wrote them out. LRT gave a handout of 99 different coping skills at the end of group.   Goal Area(s) Addressed: Patients will be able to define "coping skills". Patient will identify new coping skills.  Patient will increase communication.   Affect/Mood: Appropriate and Euthymic   Participation Level: Active   Participation Quality: Independent   Behavior: Nonchalant   Speech/Thought Process: Coherent   Insight: Limited   Judgement: Fair    Modes of Intervention: Clarification, Education, Guided Discussion, Open Conversation, and Worksheet   Patient Response to Interventions:  Receptive   Education Outcome:  Acknowledges education   Clinical Observations/Individualized Feedback: Caitlin Rivera was mostly active in their participation of session activities and group discussion. Pt identified "puzzles, TV and play games" as coping skills. Pt minimally interacted with LRT and peers while in group.    Plan: Continue to engage patient in RT group sessions 2-3x/week.   Rosina Lowenstein, LRT, CTRS 02/13/2023 12:48 PM

## 2023-02-13 NOTE — Group Note (Signed)
Recreation Therapy Group Note   Group Topic:General Recreation  Group Date: 02/13/2023 Start Time: 1500 End Time: 1600 Facilitators: Rosina Lowenstein, LRT, CTRS Location: Courtyard  Group Description: Outdoor Recreation. Patients had the option to play corn hole, ring toss, bowling or listening to music while outside in the courtyard getting fresh air and sunlight. LRT and patients discussed things that they enjoy doing in their free time outside of the hospital. LRT encouraged patients to drink water after being active and getting their heart rate up.   Goal Area(s) Addressed: Patient will identify leisure interests.  Patient will practice healthy decision making. Patient will engage in recreation activity.   Affect/Mood: Flat   Participation Level: Non-verbal   Participation Quality: Minimal Cues   Behavior: Calm   Speech/Thought Process: N/A   Insight: Limited   Judgement: Limited   Modes of Intervention: Activity   Patient Response to Interventions:  Disengaged   Education Outcome:  In group clarification offered    Clinical Observations/Individualized Feedback: Caitlin Rivera was minimally active in their participation of session activities and group discussion. Pt was present in the courtyard, however, she sat with her head down and leaning against her walker the whole time. Pt did not interact with LRT or peers while outside. Pt would nod her head yes or no to questions LRT asked.    Plan: Continue to engage patient in RT group sessions 2-3x/week.   Rosina Lowenstein, LRT, CTRS 02/13/2023 5:14 PM

## 2023-02-13 NOTE — Progress Notes (Signed)
   02/13/23 1930  Psych Admission Type (Psych Patients Only)  Admission Status Voluntary  Psychosocial Assessment  Patient Complaints Anxiety  Eye Contact Fair  Facial Expression Anxious;Animated  Affect Anxious  Speech Logical/coherent  Interaction Assertive  Motor Activity Slow (walker)  Appearance/Hygiene In scrubs  Behavior Characteristics Cooperative;Anxious  Mood Labile;Pleasant  Thought Process  Coherency WDL  Content Blaming others  Delusions None reported or observed  Perception WDL  Hallucination None reported or observed  Judgment Limited  Confusion None  Danger to Self  Current suicidal ideation? Denies  Danger to Others  Danger to Others None reported or observed   Progress note   D: Pt seen in her room in the bed. Pt denies SI, HI, AVH. Pt rates pain  0/10. Pt endorses anxiety and denies depression. "I'm trying to figure out how to get out of here." Pt endorsed some insomnia. "Since my husband died, I'm used to sleeping with the tv on to fall asleep. I don't have a tv in my room here so I told them I needed something else to go to sleep." Endorses some leg cramps at night as well. No other concerns noted at this time.  A: Pt provided support and encouragement. Pt given scheduled medication as prescribed. PRNs as appropriate. Q15 min checks for safety.   R: Pt safe on the unit. Will continue to monitor.

## 2023-02-13 NOTE — Progress Notes (Signed)
Patient was irritable on approach, she was augmentative with the current writer about medications. Patient just keeps reporting that her medication is not right, MD called at night medication pass time to get patient home medication Vistaril. She denies SI, Hi & AVH.   Patient denies anxiety and depression. She seemed to rest well through out the night.

## 2023-02-13 NOTE — Plan of Care (Signed)
  Problem: Education: Goal: Utilization of techniques to improve thought processes will improve Outcome: Not Progressing Goal: Knowledge of the prescribed therapeutic regimen will improve Outcome: Not Progressing   Problem: Activity: Goal: Interest or engagement in leisure activities will improve Outcome: Not Progressing Goal: Imbalance in normal sleep/wake cycle will improve Outcome: Not Progressing   Problem: Coping: Goal: Coping ability will improve Outcome: Not Progressing Goal: Will verbalize feelings Outcome: Not Progressing   Problem: Health Behavior/Discharge Planning: Goal: Ability to make decisions will improve Outcome: Not Progressing Goal: Compliance with therapeutic regimen will improve Outcome: Not Progressing   Problem: Role Relationship: Goal: Will demonstrate positive changes in social behaviors and relationships Outcome: Not Progressing   Problem: Safety: Goal: Ability to disclose and discuss suicidal ideas will improve Outcome: Not Progressing Goal: Ability to identify and utilize support systems that promote safety will improve Outcome: Not Progressing   Problem: Self-Concept: Goal: Will verbalize positive feelings about self Outcome: Not Progressing Goal: Level of anxiety will decrease Outcome: Not Progressing   Problem: Education: Goal: Knowledge of General Education information will improve Description: Including pain rating scale, medication(s)/side effects and non-pharmacologic comfort measures Outcome: Not Progressing   Problem: Health Behavior/Discharge Planning: Goal: Ability to manage health-related needs will improve Outcome: Not Progressing   Problem: Clinical Measurements: Goal: Ability to maintain clinical measurements within normal limits will improve Outcome: Not Progressing Goal: Will remain free from infection Outcome: Not Progressing Goal: Diagnostic test results will improve Outcome: Not Progressing Goal: Respiratory  complications will improve Outcome: Not Progressing Goal: Cardiovascular complication will be avoided Outcome: Not Progressing   Problem: Activity: Goal: Risk for activity intolerance will decrease Outcome: Not Progressing   Problem: Nutrition: Goal: Adequate nutrition will be maintained Outcome: Not Progressing   Problem: Coping: Goal: Level of anxiety will decrease Outcome: Not Progressing   Problem: Elimination: Goal: Will not experience complications related to bowel motility Outcome: Not Progressing Goal: Will not experience complications related to urinary retention Outcome: Not Progressing   Problem: Pain Managment: Goal: General experience of comfort will improve and/or be controlled Outcome: Not Progressing   Problem: Safety: Goal: Ability to remain free from injury will improve Outcome: Not Progressing   Problem: Skin Integrity: Goal: Risk for impaired skin integrity will decrease Outcome: Not Progressing   Problem: Education: Goal: Ability to state activities that reduce stress will improve Outcome: Not Progressing   Problem: Coping: Goal: Ability to identify and develop effective coping behavior will improve Outcome: Not Progressing   Problem: Self-Concept: Goal: Ability to identify factors that promote anxiety will improve Outcome: Not Progressing Goal: Level of anxiety will decrease Outcome: Not Progressing Goal: Ability to modify response to factors that promote anxiety will improve Outcome: Not Progressing

## 2023-02-13 NOTE — Progress Notes (Signed)
Psychiatric Admission Assessment Adult  Patient Identification: Caitlin Rivera MRN:  [id number] Date of Evaluation:  02/13/2023 Chief Complaint:  Depression [F32.A]  Reason for admission   68 year old female admitted to the unit voluntarily for concerns having a nervious breakdown and SI without a plan. Reports using crack cocaine x 5 days and homelessness  Chart review from last 24 hours   The patient was seen and his chart was reviewed and the case was discussed with treatment team.  Patient also attended the treatment team today she reports depression and staff reports significant emotional lability and frustration at the fact that she does not have a place to stay.  Yesterday, the psychiatry team made the following recommendations:  Continue with home medications that include: Amlodipine 5 mg a day Duloxetine 20 mg twice a day Gabapentin 600 mg twice a day Hydroxyzine 50 mg at bedtime Minipress 1 mg at bedtime Rosuvastatin 20 mg a day Metformin 500 mg daily Arimidex 1 mg daily  Information obtained during interview   The patient was seen individually and in treatment team.  She claims that she is sleeping poorly and that her depression is 7/10 and anxiety as a 9/10.  She denies any suicidal ideations.  She claims that she is very frustrated because she is homeless and nobody wants to help her.  She is externalizing her situation claiming that no one including her friends, family or the system wants to help her.  She has no place to stay.  She minimizes the use of cocaine and initially also minimized the need to go to a rehab facility.  She has not yet given any permission to provide collateral information.  She has some vague hallucinations but denies any active SI/HI/AVH.  The plan is to continue her current medications and titrate up as tolerated.  Obtain additional collateral information.  Placement is needed.  Total Time spent with patient: 1 hour Sleep  Sleep:Sleep:  Fair  Past Psychiatric History:  Psychiatric History:  Information collected from patient  Prev Dx/Sx: Depression Current Psych Provider: None reported Home Meds (current): Unable to recall Previous Med Trials: Unable to recall Therapy: None reported  Prior Psych Hospitalization: None reported Prior Self Harm: None reported Prior Violence: None reported  Family Psych History: Multiple family members with mental health history and substance use history both maternal and paternal side Family Hx suicide: None reported  Social History:  Developmental Hx: Normal development Educational Hx: 10th grade Occupational Hx: Currently not working gets Facilities manager Hx: None reported Living Situation: Reports to be homeless and is not in communication with any of her family members including her children but reports having 3 children Spiritual Hx: None reported Access to weapons/lethal means: None reported  Substance History Alcohol: Consistently denies Type of alcohol none reported Last Drink none reported Number of drinks per day none reported History of alcohol withdrawal seizures none reported History of DT's none reported Tobacco: 1 pack/day for many years Illicit drugs: Use of crack for many years, last use was 3 days ago of unknown quantity,  Prescription drug abuse: None reported Rehab hx: None reported Is the patient at risk to self? Yes.    Has the patient been a risk to self in the past 6 months? No.  Has the patient been a risk to self within the distant past? No.  Is the patient a risk to others? No.  Has the patient been a risk to others in the past 6 months? No.  Has the patient been a risk to others within the distant past? No.   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   COPD (chronic obstructive pulmonary disease) (HCC)    smoker   Diabetes mellitus without complication (HCC)    Fibroids    Hypertension    Personal history of radiation therapy    Smoker     Past Surgical History:  Procedure Laterality Date   BREAST BIOPSY Bilateral 06/03/2019   BREAST BIOPSY Left 06/14/2019   BREAST EXCISIONAL BIOPSY Right 07/2019   BREAST LUMPECTOMY Left 07/2019   BREAST LUMPECTOMY WITH RADIOACTIVE SEED LOCALIZATION Bilateral 07/31/2019   Procedure: BILATERAL BREAST LUMPECTOMY WITH RADIOACTIVE SEED LOCALIZATION;  Surgeon: Griselda Miner, MD;  Location: [facility];  Service: General;  Laterality: Bilateral;   TUBAL LIGATION     TUBAL LIGATION     Family History:  Family History  Problem Relation Age of Onset   Cancer Mother     Social History:  Social History   Substance and Sexual Activity  Alcohol Use No     Social History   Substance and Sexual Activity  Drug Use Yes   Types: "Crack" cocaine   Comment: crack-last smoked 2 wks ago-07-24-19, states she uses once a month      Allergies:   Allergies  Allergen Reactions   Morphine And Codeine Itching   Ibuprofen Itching   Lab Results:  Results for orders placed or performed during the hospital encounter of 02/10/23 (from the past 48 hours)  CBG monitoring, ED     Status: Abnormal   Collection Time: 02/11/23 12:30 PM  Result Value Ref Range   Glucose-Capillary 143 (H) 70 - 99 mg/dL    Comment: Glucose reference range applies only to samples taken after fasting for at least 8 hours.  Resp panel by RT-PCR (RSV, Flu A&B, Covid) Anterior Nasal Swab     Status: None   Collection Time: 02/11/23  2:13 PM   Specimen: Anterior Nasal Swab  Result Value Ref Range   SARS  Coronavirus 2 by RT PCR NEGATIVE NEGATIVE    Comment: (NOTE) SARS-CoV-2 target nucleic acids are NOT DETECTED.  The SARS-CoV-2 RNA is generally detectable in upper respiratory specimens during the acute phase of infection. The lowest concentration of SARS-CoV-2 viral copies this assay can detect is 138 copies/mL. A negative result does not preclude SARS-Cov-2 infection and should not be used as the sole basis for treatment or other patient management decisions. A negative result may occur with  improper specimen collection/handling, submission of specimen other than nasopharyngeal swab, presence of viral mutation(s) within the areas targeted by this assay, and inadequate number of viral copies(<138 copies/mL). A negative result must be combined with clinical observations, patient history, and epidemiological information. The expected result is Negative.  Fact Sheet for Patients:  BloggerCourse.com  Fact Sheet for Healthcare Providers:  SeriousBroker.it  This test is no t yet approved or cleared by the [place] FDA and  has been authorized for detection and/or diagnosis of SARS-CoV-2 by FDA  under an Emergency Use Authorization (EUA). This EUA will remain  in effect (meaning this test can be used) for the duration of the COVID-19 declaration under Section 564(b)(1) of the Act, 21 U.S.C.section 360bbb-3(b)(1), unless the authorization is terminated  or revoked sooner.       Influenza A by PCR NEGATIVE NEGATIVE   Influenza B by PCR NEGATIVE NEGATIVE    Comment: (NOTE) The Xpert Xpress SARS-CoV-2/FLU/RSV plus assay is intended as an aid in the diagnosis of influenza from Nasopharyngeal swab specimens and should not be used as a sole basis for treatment. Nasal washings and aspirates are unacceptable for Xpert Xpress SARS-CoV-2/FLU/RSV testing.  Fact Sheet for Patients: BloggerCourse.com  Fact Sheet  for Healthcare Providers: SeriousBroker.it  This test is not yet approved or cleared by the [place] FDA and has been authorized for detection and/or diagnosis of SARS-CoV-2 by FDA under an Emergency Use Authorization (EUA). This EUA will remain in effect (meaning this test can be used) for the duration of the COVID-19 declaration under Section 564(b)(1) of the Act, 21 U.S.C. section 360bbb-3(b)(1), unless the authorization is terminated or revoked.     Resp Syncytial Virus by PCR NEGATIVE NEGATIVE    Comment: (NOTE) Fact Sheet for Patients: BloggerCourse.com  Fact Sheet for Healthcare Providers: SeriousBroker.it  This test is not yet approved or cleared by the [place] FDA and has been authorized for detection and/or diagnosis of SARS-CoV-2 by FDA under an Emergency Use Authorization (EUA). This EUA will remain in effect (meaning this test can be used) for the duration of the COVID-19 declaration under Section 564(b)(1) of the Act, 21 U.S.C. section 360bbb-3(b)(1), unless the authorization is terminated or revoked.  Performed at [facility], [address] [place], [place] [address]     Blood Alcohol level:  Lab Results  Component Value Date   [facility] <10 02/10/2023    Metabolic Disorder Labs:  Lab Results  Component Value Date   HGBA1C 6.6 (H) 02/03/2023   MPG 142.72 02/03/2023   No results found for: "PROLACTIN" No results found for: "CHOL", "TRIG", "HDL", "CHOLHDL", "VLDL", "LDLCALC"  Current Medications: Current Facility-Administered Medications  Medication Dose Route Frequency Provider Last Rate Last Admin   acetaminophen (TYLENOL) tablet 650 mg  650 mg Oral Q6H PRN Penn, Cranston Neighbor, NP   650 mg at 02/11/23 2238   alum & mag hydroxide-simeth (MAALOX/MYLANTA) 200-200-20 MG/5ML suspension 30 mL  30 mL Oral Q4H PRN Penn, Cranston Neighbor, NP       amLODipine (NORVASC) tablet 5 mg  5  mg Oral Daily Penn, Cicely, NP   5 mg at 02/13/23 1052   anastrozole (ARIMIDEX) tablet 1 mg  1 mg Oral Daily Penn, Cicely, NP   1 mg at 02/13/23 1052   DULoxetine (CYMBALTA) DR capsule 20 mg  20 mg Oral BID Penn, Cranston Neighbor, NP   20 mg at 02/13/23 1051   fluticasone furoate-vilanterol (BREO ELLIPTA) 200-25 MCG/ACT 1 puff  1 puff Inhalation Daily Penn, Cicely, NP       gabapentin (NEURONTIN) capsule 600 mg  600 mg Oral BID Penn, Cicely, NP   600 mg at 02/13/23 1051   hydrOXYzine (ATARAX) tablet 50 mg  50 mg Oral QHS Massengill, Harrold Donath, MD   50 mg at 02/12/23 2213   magnesium hydroxide (MILK OF MAGNESIA) suspension 30 mL  30 mL Oral Daily PRN Penn, Cranston Neighbor, NP       metFORMIN (GLUCOPHAGE) tablet 500 mg  500 mg Oral Q breakfast Mcneil Sober, NP  500 mg at 02/13/23 1051   OLANZapine zydis (ZYPREXA) disintegrating tablet 5 mg  5 mg Oral TID PRN Mcneil Sober, NP   5 mg at 02/12/23 2213   prazosin (MINIPRESS) capsule 1 mg  1 mg Oral QHS Penn, Cicely, NP   1 mg at 02/11/23 2237   rosuvastatin (CRESTOR) tablet 20 mg  20 mg Oral Daily Penn, Cranston Neighbor, NP   20 mg at 02/13/23 1051   tiZANidine (ZANAFLEX) tablet 4 mg  4 mg Oral Q8H PRN Mcneil Sober, NP   4 mg at 02/12/23 2213   PTA Medications: Medications Prior to Admission  Medication Sig Dispense Refill Last Dose/Taking   acetaminophen (TYLENOL 8 HOUR) 650 MG CR tablet Take 1 tablet (650 mg total) by mouth every 8 (eight) hours as needed for pain or fever. 30 tablet 0    albuterol (VENTOLIN HFA) 108 (90 Base) MCG/ACT inhaler Inhale 1-2 puffs into the lungs every 6 (six) hours as needed for wheezing or shortness of breath. 6.7 g 0    amLODipine (NORVASC) 5 MG tablet Take 1 tablet (5 mg total) by mouth daily. 30 tablet 0    anastrozole (ARIMIDEX) 1 MG tablet Take 1 tablet (1 mg total) by mouth daily. 30 tablet 0    DULoxetine (CYMBALTA) 20 MG capsule Take 1 capsule (20 mg total) by mouth 2 (two) times daily. 60 capsule 0    gabapentin (NEURONTIN) 600 MG tablet  Take 1 tablet (600 mg total) by mouth 2 (two) times daily. 60 tablet 0    guaiFENesin-dextromethorphan (ROBITUSSIN DM) 100-10 MG/5ML syrup Take 5 mLs by mouth every 4 (four) hours as needed for up to 14 days for cough. 118 mL 0    hydrOXYzine (VISTARIL) 50 MG capsule Take 1 capsule (50 mg total) by mouth at bedtime as needed. 30 capsule 0    metFORMIN (GLUCOPHAGE) 500 MG tablet Take 1 tablet (500 mg total) by mouth daily with breakfast. 30 tablet 0    OVER THE COUNTER MEDICATION Take 1-2 tablets by mouth 2 (two) times daily as needed (for pain). OTC Pain Reliever PM      prazosin (MINIPRESS) 1 MG capsule Take 1 capsule (1 mg total) by mouth at bedtime. 30 capsule 0    rosuvastatin (CRESTOR) 20 MG tablet Take 1 tablet (20 mg total) by mouth daily. 30 tablet 0    SYMBICORT 160-4.5 MCG/ACT inhaler Inhale 2 puffs into the lungs in the morning and at bedtime. 6 g 0    tiZANidine (ZANAFLEX) 4 MG tablet Take 1 tablet (4 mg total) by mouth every 8 (eight) hours as needed for up to 14 days. 30 tablet 0     Psychiatric Specialty Exam:  Presentation  General Appearance:  Casual  Eye Contact: Fair  Speech: Clear and Coherent  Speech Volume: Decreased    Mood and Affect  Mood: Dysphoric  Affect: Labile   Thought Process  Thought Processes: Irrevelant  Descriptions of Associations:Intact  Orientation:Full (Time, Place and Person)  Thought Content:Perseveration; Rumination  Hallucinations:Hallucinations: None Auditory hallucinations of her dead familymembers Ideas of Reference:None  Suicidal Thoughts:Suicidal Thoughts: No SI Active Intent and/or Plan: Without Plan; Without Intent  Homicidal Thoughts:Homicidal Thoughts: No   Sensorium  Memory: Immediate Fair; Remote Fair; Recent Fair  Judgment: Poor  Insight: Poor   Executive Functions  Concentration: Poor  Attention Span: Fair  Recall: [employer] of Knowledge: Fair  Language: Fair   Psychomotor  Activity  Psychomotor Activity: Psychomotor Activity: Normal  Assets  Assets: Manufacturing systems engineer; Desire for Improvement    Musculoskeletal: Strength & Muscle Tone: decreased Gait & Station: unsteady  Physical Exam: Physical Exam HENT:     Head: Normocephalic.     Nose: Nose normal.     Mouth/Throat:     Mouth: Mucous membranes are moist.  Eyes:     Pupils: Pupils are equal, round, and reactive to light.  Cardiovascular:     Rate and Rhythm: Normal rate.     Pulses: Normal pulses.  Pulmonary:     Breath sounds: Normal breath sounds.  Abdominal:     General: Bowel sounds are normal.  Skin:    General: Skin is warm.  Neurological:     General: No focal deficit present.     Mental Status: She is alert.    Review of Systems  Constitutional: Negative.   HENT: Negative.    Eyes: Negative.   Respiratory: Negative.    Cardiovascular: Negative.   Gastrointestinal: Negative.   Skin: Negative.   Neurological: Negative.    Blood pressure 132/61, pulse (!) 54, temperature 98.1 F (36.7 C), resp. rate 16, height 5\' 6"  (1.676 m), weight 104.6 kg, SpO2 95%. Body mass index is 37.2 kg/m.  Principal Diagnosis: MDD (major depressive disorder), recurrent severe, without psychosis (HCC) Diagnosis:  Principal Problem:   MDD (major depressive disorder), recurrent severe, without psychosis (HCC) Active Problems:   Depression   Clinical Decision Making: 68 year old female with history of mood disorder, cocaine use currently admitted for suicidal statements and aggressive behaviors in the emergency room.  Patient is homeless, no family contacts, not following up with any outpatient psychiatrist, reports feeling depressed with suicidal thoughts.  On the unit she is noted to be impulsive and aggressive towards the staff, destroying property on the unit.  She needs inpatient hospitalization for further stabilization.  Treatment Plan Summary:  Safety and Monitoring:              -- Involuntary admission to inpatient psychiatric unit for safety, stabilization and treatment             -- Daily contact with patient to assess and evaluate symptoms and progress in treatment             -- Patient's case to be discussed in multi-disciplinary team meeting             -- Observation Level: q15 minute checks             -- Vital signs:  q12 hours             -- Precautions: suicide, elopement, and assault   2. Psychiatric Diagnoses and Treatment:              Continue Cymbalta 20 mg twice daily and prazosin 1 mg daily If patient remains agitated-will consider a mood stabilizer     -- The risks/benefits/side-effects/alternatives to this medication were discussed in detail with the patient and time was given for questions. The patient consents to medication trial.                -- Metabolic profile and EKG monitoring obtained while on an atypical antipsychotic (BMI: Lipid Panel: HbgA1c: QTc:)              -- Encouraged patient to participate in unit milieu and in scheduled group therapies  3. Medical Issues Being Addressed:    No urgent medical needs noted at this time 4. Discharge Planning:              -- Social work and case management to assist with discharge planning and identification of hospital follow-up needs prior to discharge             -- Estimated LOS: 5-7 days             -- Discharge Concerns: Need to establish a safety plan; Medication compliance and effectiveness             -- Discharge Goals: Return home with outpatient referrals follow ups.  Consider inpatient rehab.  Physician Treatment Plan for Primary Diagnosis: MDD (major depressive disorder), recurrent severe, without psychosis (HCC) Long Term Goal(s): Improvement in symptoms so as ready for discharge  Short Term Goals: Ability to identify changes in lifestyle to reduce recurrence of condition will improve, Ability to verbalize feelings will improve, Ability to  disclose and discuss suicidal ideas, Ability to maintain clinical measurements within normal limits will improve, Compliance with prescribed medications will improve, and Ability to identify triggers associated with substance abuse/mental health issues will improve  Physician Treatment Plan for Secondary Diagnosis: Principal Problem:   MDD (major depressive disorder), recurrent severe, without psychosis (HCC) Active Problems:   Depression  Long Term Goal(s): Improvement in symptoms so as ready for discharge  Short Term Goals: Ability to identify changes in lifestyle to reduce recurrence of condition will improve, Ability to verbalize feelings will improve, Ability to disclose and discuss suicidal ideas, Ability to demonstrate self-control will improve, Ability to identify and develop effective coping behaviors will improve, Ability to maintain clinical measurements within normal limits will improve, Compliance with prescribed medications will improve, and Ability to identify triggers associated with substance abuse/mental health issues will improve  I certify that inpatient services furnished can reasonably be expected to improve the patient's condition.    Rex Kras, MD 2/3/202511:33 AMPatient ID: Creig Hines, female   DOB: 1955/11/14, 68 y.o.   MRN: [id number]

## 2023-02-13 NOTE — Progress Notes (Signed)
Pt at nurse's station saying that she is having trouble breathing. States that she told someone that she can't use her Breo inhaler because she can't "pull the medicine up." Contacted provider and asked for a rescue albuterol inhaler. Oxygen saturation is at 96. Pt given a cool cloth on her head to help with calming her down. Orders placed.

## 2023-02-13 NOTE — Group Note (Signed)
Date:  02/13/2023 Time:  6:25 PM  Group Topic/Focus:  Spirituality:   The focus of this group is to discuss how one's spirituality can aide in recovery.    Participation Level:  Active  Participation Quality:  Appropriate, Attentive, Sharing, and Supportive  Affect:  Appropriate  Cognitive:  Alert, Appropriate, and Oriented  Insight: Appropriate and Good  Engagement in Group:  Engaged and Supportive  Modes of Intervention:  Socialization  Additional Comments:     Alexis Frock 02/13/2023, 6:25 PM

## 2023-02-13 NOTE — BH IP Treatment Plan (Signed)
Interdisciplinary Treatment and Diagnostic Plan Update  02/13/2023 Time of Session: 10:00 AM  Caitlin Rivera MRN: [id number]  Principal Diagnosis: MDD (major depressive disorder), recurrent severe, without psychosis (HCC)  Secondary Diagnoses: Principal Problem:   MDD (major depressive disorder), recurrent severe, without psychosis (HCC) Active Problems:   Depression   Current Medications:  Current Facility-Administered Medications  Medication Dose Route Frequency Provider Last Rate Last Admin   acetaminophen (TYLENOL) tablet 650 mg  650 mg Oral Q6H PRN Penn, Cranston Neighbor, NP   650 mg at 02/11/23 2238   alum & mag hydroxide-simeth (MAALOX/MYLANTA) 200-200-20 MG/5ML suspension 30 mL  30 mL Oral Q4H PRN Penn, Cranston Neighbor, NP       amLODipine (NORVASC) tablet 5 mg  5 mg Oral Daily Penn, Cicely, NP   5 mg at 02/13/23 1052   anastrozole (ARIMIDEX) tablet 1 mg  1 mg Oral Daily Penn, Cicely, NP   1 mg at 02/13/23 1052   DULoxetine (CYMBALTA) DR capsule 20 mg  20 mg Oral BID Penn, Cranston Neighbor, NP   20 mg at 02/13/23 1051   fluticasone furoate-vilanterol (BREO ELLIPTA) 200-25 MCG/ACT 1 puff  1 puff Inhalation Daily Penn, Cicely, NP       gabapentin (NEURONTIN) capsule 600 mg  600 mg Oral BID Penn, Cranston Neighbor, NP   600 mg at 02/13/23 1051   hydrOXYzine (ATARAX) tablet 50 mg  50 mg Oral QHS Massengill, Harrold Donath, MD   50 mg at 02/12/23 2213   magnesium hydroxide (MILK OF MAGNESIA) suspension 30 mL  30 mL Oral Daily PRN Mcneil Sober, NP       metFORMIN (GLUCOPHAGE) tablet 500 mg  500 mg Oral Q breakfast Penn, Cicely, NP   500 mg at 02/13/23 1051   OLANZapine zydis (ZYPREXA) disintegrating tablet 5 mg  5 mg Oral TID PRN Mcneil Sober, NP   5 mg at 02/12/23 2213   prazosin (MINIPRESS) capsule 1 mg  1 mg Oral QHS Penn, Cicely, NP   1 mg at 02/11/23 2237   rosuvastatin (CRESTOR) tablet 20 mg  20 mg Oral Daily Penn, Cranston Neighbor, NP   20 mg at 02/13/23 1051   tiZANidine (ZANAFLEX) tablet 4 mg  4 mg Oral Q8H PRN Mcneil Sober, NP    4 mg at 02/12/23 2213   PTA Medications: Medications Prior to Admission  Medication Sig Dispense Refill Last Dose/Taking   acetaminophen (TYLENOL 8 HOUR) 650 MG CR tablet Take 1 tablet (650 mg total) by mouth every 8 (eight) hours as needed for pain or fever. 30 tablet 0    albuterol (VENTOLIN HFA) 108 (90 Base) MCG/ACT inhaler Inhale 1-2 puffs into the lungs every 6 (six) hours as needed for wheezing or shortness of breath. 6.7 g 0    amLODipine (NORVASC) 5 MG tablet Take 1 tablet (5 mg total) by mouth daily. 30 tablet 0    anastrozole (ARIMIDEX) 1 MG tablet Take 1 tablet (1 mg total) by mouth daily. 30 tablet 0    DULoxetine (CYMBALTA) 20 MG capsule Take 1 capsule (20 mg total) by mouth 2 (two) times daily. 60 capsule 0    gabapentin (NEURONTIN) 600 MG tablet Take 1 tablet (600 mg total) by mouth 2 (two) times daily. 60 tablet 0    guaiFENesin-dextromethorphan (ROBITUSSIN DM) 100-10 MG/5ML syrup Take 5 mLs by mouth every 4 (four) hours as needed for up to 14 days for cough. 118 mL 0    hydrOXYzine (VISTARIL) 50 MG capsule Take 1 capsule (50 mg total) by mouth  at bedtime as needed. 30 capsule 0    metFORMIN (GLUCOPHAGE) 500 MG tablet Take 1 tablet (500 mg total) by mouth daily with breakfast. 30 tablet 0    OVER THE COUNTER MEDICATION Take 1-2 tablets by mouth 2 (two) times daily as needed (for pain). OTC Pain Reliever PM      prazosin (MINIPRESS) 1 MG capsule Take 1 capsule (1 mg total) by mouth at bedtime. 30 capsule 0    rosuvastatin (CRESTOR) 20 MG tablet Take 1 tablet (20 mg total) by mouth daily. 30 tablet 0    SYMBICORT 160-4.5 MCG/ACT inhaler Inhale 2 puffs into the lungs in the morning and at bedtime. 6 g 0    tiZANidine (ZANAFLEX) 4 MG tablet Take 1 tablet (4 mg total) by mouth every 8 (eight) hours as needed for up to 14 days. 30 tablet 0     Patient Stressors: Financial difficulties   Marital or family conflict   Substance abuse   Traumatic event    Patient Strengths: Ability  for insight  Average or above average intelligence  Motivation for treatment/growth   Treatment Modalities: Medication Management, Group therapy, Case management,  1 to 1 session with clinician, Psychoeducation, Recreational therapy.   Physician Treatment Plan for Primary Diagnosis: MDD (major depressive disorder), recurrent severe, without psychosis (HCC) Long Term Goal(s): Improvement in symptoms so as ready for discharge   Short Term Goals: Ability to identify changes in lifestyle to reduce recurrence of condition will improve Ability to verbalize feelings will improve Ability to disclose and discuss suicidal ideas Ability to demonstrate self-control will improve Ability to identify and develop effective coping behaviors will improve Ability to maintain clinical measurements within normal limits will improve Compliance with prescribed medications will improve Ability to identify triggers associated with substance abuse/mental health issues will improve  Medication Management: Evaluate patient's response, side effects, and tolerance of medication regimen.  Therapeutic Interventions: 1 to 1 sessions, Unit Group sessions and Medication administration.  Evaluation of Outcomes: Progressing  Physician Treatment Plan for Secondary Diagnosis: Principal Problem:   MDD (major depressive disorder), recurrent severe, without psychosis (HCC) Active Problems:   Depression  Long Term Goal(s): Improvement in symptoms so as ready for discharge   Short Term Goals: Ability to identify changes in lifestyle to reduce recurrence of condition will improve Ability to verbalize feelings will improve Ability to disclose and discuss suicidal ideas Ability to demonstrate self-control will improve Ability to identify and develop effective coping behaviors will improve Ability to maintain clinical measurements within normal limits will improve Compliance with prescribed medications will improve Ability  to identify triggers associated with substance abuse/mental health issues will improve     Medication Management: Evaluate patient's response, side effects, and tolerance of medication regimen.  Therapeutic Interventions: 1 to 1 sessions, Unit Group sessions and Medication administration.  Evaluation of Outcomes: Progressing   RN Treatment Plan for Primary Diagnosis: MDD (major depressive disorder), recurrent severe, without psychosis (HCC) Long Term Goal(s): Knowledge of disease and therapeutic regimen to maintain health will improve  Short Term Goals: Ability to remain free from injury will improve, Ability to verbalize frustration and anger appropriately will improve, Ability to demonstrate self-control, Ability to participate in decision making will improve, Ability to verbalize feelings will improve, Ability to disclose and discuss suicidal ideas, Ability to identify and develop effective coping behaviors will improve, and Compliance with prescribed medications will improve  Medication Management: RN will administer medications as ordered by provider, will assess and evaluate patient's  response and provide education to patient for prescribed medication. RN will report any adverse and/or side effects to prescribing provider.  Therapeutic Interventions: 1 on 1 counseling sessions, Psychoeducation, Medication administration, Evaluate responses to treatment, Monitor vital signs and CBGs as ordered, Perform/monitor CIWA, COWS, AIMS and Fall Risk screenings as ordered, Perform wound care treatments as ordered.  Evaluation of Outcomes: Progressing   LCSW Treatment Plan for Primary Diagnosis: MDD (major depressive disorder), recurrent severe, without psychosis (HCC) Long Term Goal(s): Safe transition to appropriate next level of care at discharge, Engage patient in therapeutic group addressing interpersonal concerns.  Short Term Goals: Engage patient in aftercare planning with referrals and  resources, Increase social support, Increase ability to appropriately verbalize feelings, Increase emotional regulation, Facilitate acceptance of mental health diagnosis and concerns, Facilitate patient progression through stages of change regarding substance use diagnoses and concerns, Identify triggers associated with mental health/substance abuse issues, and Increase skills for wellness and recovery  Therapeutic Interventions: Assess for all discharge needs, 1 to 1 time with Social worker, Explore available resources and support systems, Assess for adequacy in community support network, Educate family and significant other(s) on suicide prevention, Complete Psychosocial Assessment, Interpersonal group therapy.  Evaluation of Outcomes: Progressing   Progress in Treatment: Attending groups: Yes. Participating in groups: Yes. Taking medication as prescribed: Yes. Toleration medication: Yes. Family/Significant other contact made: No, will contact:  pt declined  Patient understands diagnosis: Yes. Discussing patient identified problems/goals with staff: Yes. Medical problems stabilized or resolved: Yes. Denies suicidal/homicidal ideation: No. Issues/concerns per patient self-inventory: No. Other: None    New problem(s) identified: No, Describe:  None identified   New Short Term/Long Term Goal(s): elimination of symptoms of psychosis, medication management for mood stabilization; elimination of SI thoughts; development of comprehensive mental wellness plan.   Patient Goals:  " I don't want to accomplish nothing but yall getting me somewhere to stay"   Discharge Plan or Barriers: CSW will assist with discharge planning   Reason for Continuation of Hospitalization: Depression Medication stabilization Suicidal ideation  Estimated Length of Stay: 1 to 7 days   Last 3 [place] Suicide Severity Risk Score: Flowsheet Row ED from 02/10/2023 in [facility] Emergency Department at [facility] ED to Hosp-Admission (Discharged) from 02/02/2023 in [facility] from 02/25/2020 in [facility] Emergency Department at [facility]  C-SSRS RISK CATEGORY High Risk No Risk No Risk       Last PHQ 2/9 Scores:     No data to display          Scribe for Treatment Team: Elza Rafter, Theresia Majors 02/13/2023 11:42 AM

## 2023-02-14 DIAGNOSIS — F332 Major depressive disorder, recurrent severe without psychotic features: Secondary | ICD-10-CM | POA: Diagnosis not present

## 2023-02-14 MED ORDER — MOMETASONE FURO-FORMOTEROL FUM 100-5 MCG/ACT IN AERO
2.0000 | INHALATION_SPRAY | Freq: Two times a day (BID) | RESPIRATORY_TRACT | Status: DC
  Administered 2023-02-14 – 2023-02-16 (×4): 2 via RESPIRATORY_TRACT
  Filled 2023-02-14: qty 8.8

## 2023-02-14 NOTE — Group Note (Signed)
 Recreation Therapy Group Note   Group Topic:Other  Group Date: 02/14/2023 Start Time: 1430 End Time: 1545 Facilitators: Celestia Jeoffrey BRAVO, LRT, CTRS Location: Courtyard  Group Description: Outdoor Recreation. Patients had the option to play corn hole, ring toss, bowling or listening to music while outside in the courtyard getting fresh air and sunlight. LRT and patients discussed things that they enjoy doing in their free time outside of the hospital. LRT encouraged patients to drink water after being active and getting their heart rate up.   Goal Area(s) Addressed: Patient will identify leisure interests.  Patient will practice healthy decision making. Patient will engage in recreation activity.   Affect/Mood: Flat   Participation Level: Minimal    Clinical Observations/Individualized Feedback: Caitlin Rivera was present in the courtyard at the start of group. Pt did not interact with peers or LRT while outside. Pt went inside early and did not return out.   Plan: Continue to engage patient in RT group sessions 2-3x/week.   Jeoffrey BRAVO Celestia, LRT, CTRS 02/14/2023 5:36 PM

## 2023-02-14 NOTE — Progress Notes (Signed)
 [facility] MD Progress Note  02/14/2023  Caitlin Rivera  MRN:  [id number]  68 year old female admitted to the unit voluntarily for concerns having a nervious breakdown and SI without a plan. 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  Patient said that she is here because she had a nervous breakdown.  Patient also reports that she has been using cocaine.  We discussed abstaining from drug use.  Patient said that she feels somewhat better today.  She denies thoughts of harming herself or others which is an improvement from yesterday.  Patient was encouraged to attend group and work on coping strategies and a safe discharge plan.  Patient denies manic or psychotic symptoms   Principal Problem: MDD (major depressive disorder), recurrent severe, without psychosis (HCC) Diagnosis: Principal Problem:   MDD (major depressive disorder), recurrent severe, without psychosis (HCC) Active Problems:   Depression   Past Psychiatric History: Prev Dx/Sx: Depression Current Psych Provider: None reported Home Meds (current): Unable to recall Previous Med Trials: Unable to recall Therapy: None reported   Prior Psych Hospitalization: None reported Prior Self Harm: None reported Prior Violence: None reported  Past Medical History:  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   COPD (chronic obstructive pulmonary disease) (HCC)    smoker   Diabetes mellitus without complication (HCC)    Fibroids    Hypertension    Personal history of radiation therapy    Smoker     Past Surgical History:  Procedure Laterality Date   BREAST BIOPSY Bilateral 06/03/2019   BREAST BIOPSY Left 06/14/2019   BREAST EXCISIONAL BIOPSY Right 07/2019   BREAST LUMPECTOMY Left 07/2019   BREAST LUMPECTOMY WITH RADIOACTIVE SEED LOCALIZATION Bilateral 07/31/2019    Procedure: BILATERAL BREAST LUMPECTOMY WITH RADIOACTIVE SEED LOCALIZATION;  Surgeon: Curvin Deward MOULD, MD;  Location:  SURGERY CENTER;  Service: General;  Laterality: Bilateral;   TUBAL LIGATION     TUBAL LIGATION     Family History:  Family History  Problem Relation Age of Onset   Cancer Mother    Family Psychiatric  History: Patient reports ,multiple family members with mental health history and substance use history both maternal and paternal side  Social History:  Social History   Substance and Sexual Activity  Alcohol Use No     Social History   Substance and Sexual Activity  Drug Use Yes   Types: Crack cocaine   Comment: crack-last smoked 2 wks ago-07-24-19, states she uses once a month    Social History   Socioeconomic History   Marital status: Widowed    Spouse name: Not on file   Number of children: Not on file   Years of education: Not on file   Highest education level: Not on file  Occupational History   Not on file  Tobacco Use   Smoking status: Every Day    Current packs/day: 0.50    Types: Cigarettes   Smokeless tobacco: Never  Substance and Sexual Activity   Alcohol use: No   Drug use: Yes    Types: Crack cocaine    Comment: crack-last smoked 2 wks ago-07-24-19, states she uses once a month   Sexual activity: Not on file  Other Topics Concern   Not on file  Social History Narrative   Not on file   Social Drivers  of Health   Financial Resource Strain: Not on file  Food Insecurity: Patient Declined (02/11/2023)   Hunger Vital Sign    Worried About Running Out of Food in the Last Year: Patient declined    Ran Out of Food in the Last Year: Patient declined  Recent Concern: Food Insecurity - Food Insecurity Present (02/04/2023)   Hunger Vital Sign    Worried About Running Out of Food in the Last Year: Sometimes true    Ran Out of Food in the Last Year: Sometimes true  Transportation Needs: Unmet Transportation Needs (02/11/2023)   PRAPARE -  Administrator, Civil Service (Medical): Yes    Lack of Transportation (Non-Medical): Yes  Physical Activity: Not on file  Stress: Not on file  Social Connections: Moderately Isolated (02/11/2023)   Social Connection and Isolation Panel [NHANES]    Frequency of Communication with Friends and Family: Twice a week    Frequency of Social Gatherings with Friends and Family: Twice a week    Attends Religious Services: 1 to 4 times per year    Active Member of [employer] or Organizations: No    Attends Banker Meetings: Never    Marital Status: Widowed                   Sleep: Fair  Appetite:  Fair  Current Medications: Current Facility-Administered Medications  Medication Dose Route Frequency Provider Last Rate Last Admin   acetaminophen  (TYLENOL ) tablet 650 mg  650 mg Oral Q6H PRN Penn, Reymundo, NP   650 mg at 02/11/23 2238   albuterol  (VENTOLIN  HFA) 108 (90 Base) MCG/ACT inhaler 1-2 puff  1-2 puff Inhalation Q6H PRN Onuoha, Chinwendu V, NP   2 puff at 02/14/23 1106   alum & mag hydroxide-simeth (MAALOX/MYLANTA) 200-200-20 MG/5ML suspension 30 mL  30 mL Oral Q4H PRN Penn, Reymundo, NP       amLODipine  (NORVASC ) tablet 5 mg  5 mg Oral Daily Penn, Cicely, NP   5 mg at 02/14/23 1100   anastrozole  (ARIMIDEX ) tablet 1 mg  1 mg Oral Daily Penn, Cicely, NP   1 mg at 02/14/23 1100   DULoxetine  (CYMBALTA ) DR capsule 20 mg  20 mg Oral BID Penn, Reymundo, NP   20 mg at 02/14/23 1100   fluticasone  furoate-vilanterol (BREO ELLIPTA ) 200-25 MCG/ACT 1 puff  1 puff Inhalation Daily Penn, Cicely, NP       gabapentin  (NEURONTIN ) capsule 600 mg  600 mg Oral BID Penn, Reymundo, NP   600 mg at 02/14/23 1100   hydrOXYzine  (ATARAX ) tablet 50 mg  50 mg Oral QHS Massengill, Rankin, MD   50 mg at 02/13/23 2112   magnesium  hydroxide (MILK OF MAGNESIA) suspension 30 mL  30 mL Oral Daily PRN Penn, Reymundo, NP       metFORMIN  (GLUCOPHAGE ) tablet 500 mg  500 mg Oral Q breakfast Penn, Cicely, NP   500 mg  at 02/14/23 1100   OLANZapine  zydis (ZYPREXA ) disintegrating tablet 5 mg  5 mg Oral TID PRN Sammy Reymundo, NP   5 mg at 02/12/23 2213   prazosin  (MINIPRESS ) capsule 1 mg  1 mg Oral QHS Penn, Cicely, NP   1 mg at 02/13/23 2112   rosuvastatin  (CRESTOR ) tablet 20 mg  20 mg Oral Daily Penn, Cicely, NP   20 mg at 02/14/23 1100   tiZANidine  (ZANAFLEX ) tablet 4 mg  4 mg Oral Q8H PRN Penn, Reymundo, NP   4 mg at 02/14/23 1105  Lab Results: No results found for this or any previous visit (from the past 48 hours).  Blood Alcohol level:  Lab Results  Component Value Date   ETH <10 02/10/2023    Metabolic Disorder Labs: Lab Results  Component Value Date   HGBA1C 6.6 (H) 02/03/2023   MPG 142.72 02/03/2023     Musculoskeletal: Strength & Muscle Tone: within normal limits Gait & Station: normal Patient leans: N/A  Psychiatric Specialty Exam:  Presentation  General Appearance:  Disheveled   Eye Contact: Good   Speech: Spontaneous   Speech Volume: Normal       Mood and Affect  Mood:  Fine   Affect: Stable and mood congruent     Thought Process  Thought Processes: Goal-directed   Descriptions of Associations: Intact   Orientation: Oriented to time, place, and person   Thought Content: Improved   Hallucinations: Denies  Ideas of Reference:None   Suicidal Thoughts: Denies   Homicidal Thoughts: Denies   Sensorium  Memory: Immediate Fair; Recent Fair; Remote Poor   Judgment: Improving   Insight: Shallow     Executive Functions  Concentration: Fair   Attention Span: Fair   Recall: [employer] of Knowledge: Fair   Language: Fair     Psychomotor Activity  Psychomotor Activity: Psychomotor Activity: Normal     Assets  Assets: Communication Skills; Desire for Improvement; Resilience       Physical Exam: Physical Exam HENT:     Head: Normocephalic.     Nose: Nose normal.     Mouth/Throat:     Mouth: Mucous membranes are moist.   Eyes:     Pupils: Pupils are equal, round, and reactive to light.  Cardiovascular:     Rate and Rhythm: Normal rate.     Pulses: Normal pulses.  Pulmonary:     Breath sounds: Normal breath sounds.  Abdominal:     General: Bowel sounds are normal.  Skin:    General: Skin is warm.  Neurological:     General: No focal deficit present.     Mental Status: She is alert.      Review of Systems  Constitutional: Negative.   HENT: Negative.    Eyes: Negative.   Respiratory: Negative.    Cardiovascular: Negative.   Gastrointestinal: Negative.   Skin: Negative.   Neurological: Negative.    Blood pressure (!) 126/58, pulse 64, temperature 98.3 F (36.8 C), resp. rate 14, height 5' 6 (1.676 m), weight 104.6 kg, SpO2 (!) 89%. Body mass index is 37.2 kg/m.   Treatment Plan Summary: Daily contact with patient to assess and evaluate symptoms and progress in treatment and Medication management  Safety and Monitoring:             -- Involuntary admission to inpatient psychiatric unit for safety, stabilization and treatment             -- Daily contact with patient to assess and evaluate symptoms and progress in treatment             -- Patient's case to be discussed in multi-disciplinary team meeting             -- Observation Level: q15 minute checks             -- Vital signs:  q12 hours             -- Precautions: suicide, elopement, and assault   2. Psychiatric Diagnoses and Treatment:              -  Continue Cymbalta  20 mg twice daily and prazosin  1 mg daily   - Encouraged patient to participate in unit milieu and in scheduled group therapies                          3. Medical Issues Being Addressed:    No urgent medical needs noted at this time 4. Discharge Planning:              -- Social work and case management to assist with discharge planning and identification of hospital follow-up needs prior to discharge             -- Estimated LOS: 5-7 days             -- Discharge  Concerns: Need to establish a safety plan; Medication compliance and effectiveness             -- Discharge Goals: Return home with outpatient referrals follow ups.  Consider inpatient rehab.   Wenceslao Harries, MD

## 2023-02-14 NOTE — Group Note (Signed)
 Date:  02/14/2023 Time:  9:44 PM  Group Topic/Focus:  Wrap-Up Group:   The focus of this group is to help patients review their daily goal of treatment and discuss progress on daily workbooks.    Participation Level:  Active  Participation Quality:  Appropriate  Affect:  Appropriate  Cognitive:  Appropriate  Insight: Appropriate  Engagement in Group:  Engaged  Modes of Intervention:  Discussion  Additional Comments:    Caitlin Rivera CHRISTELLA Bunker 02/14/2023, 9:44 PM

## 2023-02-14 NOTE — Progress Notes (Signed)
   02/14/23 1800  Psych Admission Type (Psych Patients Only)  Admission Status Voluntary  Psychosocial Assessment  Patient Complaints None  Eye Contact Fair  Facial Expression Animated  Affect Flat  Speech Logical/coherent  Interaction Forwards little  Motor Activity Slow (walker)  Appearance/Hygiene Disheveled  Behavior Characteristics Cooperative  Mood Pleasant  Thought Process  Coherency WDL  Content Blaming others;Blaming self  Delusions None reported or observed  Perception WDL  Hallucination None reported or observed  Judgment Limited  Confusion None  Danger to Self  Current suicidal ideation? Denies  Danger to Others  Danger to Others None reported or observed

## 2023-02-14 NOTE — Group Note (Signed)
 Recreation Therapy Group Note   Group Topic:Health and Wellness  Group Date: 02/14/2023 Start Time: 1100 End Time: 1130 Facilitators: Celestia Jeoffrey BRAVO, LRT, CTRS Location:  Dayroom  Group Description: Seated Exercise. LRT discussed the mental and physical benefits of exercise. LRT and group discussed how physical activity can be used as a coping skill. Pt's and LRT followed along to an exercise video on the TV screen that provided a visual representation and audio description of every exercise performed. Pt's encouraged to listen to their bodies and stop at any time if they experience feelings of discomfort or pain. Pts were encouraged to drink water and stay hydrated.   Goal Area(s) Addressed: Patient will learn benefits of physical activity. Patient will identify exercise as a coping skill.  Patient will follow multistep directions. Patient will try a new leisure interest.    Affect/Mood: N/A   Participation Level: Non-verbal    Clinical Observations/Individualized Feedback: Caitlin Rivera was present in the dayroom at the time of group, however, pt was asleep with her head on the table duration of session.   Plan: Continue to engage patient in RT group sessions 2-3x/week.   Jeoffrey BRAVO Celestia, LRT, CTRS 02/14/2023 1:06 PM

## 2023-02-14 NOTE — Progress Notes (Signed)
   02/14/23 9147  15 Minute Checks  Location Bedroom  Visual Appearance Calm  Behavior Sleeping  Sleep (Behavioral Health Patients Only)  Calculate sleep? (Click Yes once per 24 hr at 0600 safety check) Yes  Documented sleep last 24 hours 9.25

## 2023-02-14 NOTE — Progress Notes (Addendum)
   02/14/23 2015  Psych Admission Type (Psych Patients Only)  Admission Status Voluntary  Psychosocial Assessment  Patient Complaints Other (Comment) (wants to be discharged)  Eye Contact Fair  Facial Expression Animated  Affect Silly  Speech Logical/coherent;Loud  Interaction Assertive  Motor Activity Other (Comment) (uses walker; educated on need to slow her pace when using it)  Appearance/Hygiene In scrubs  Behavior Characteristics Cooperative;Anxious  Mood Euphoric  Thought Process  Coherency Circumstantial  Content Blaming others  Delusions None reported or observed  Perception WDL  Hallucination None reported or observed  Judgment Limited  Confusion None  Danger to Self  Current suicidal ideation? Denies  Danger to Others  Danger to Others None reported or observed   Progress note   D: Pt seen at nurse's station. Pt denies SI, HI, AVH. Pt rates pain  8/10 as acute pain in her right shoulder that she said began around the time she was in the hospital with the flu in January. Pt rates anxiety  0/10 and depression  0/10. Pt seen walking very fast with her walker. I'm just feeling good today. Pt educated on the need to be safe on the unit. Pt asked if she still needed the walker. Y'all gave it to me so I'm gonna use it. Pt then asked to slow down and observe care when walking in the hallways. Pt verbalized understanding. Pt denied any issues with her breathing. New rescue inhaler for her COPD was ordered by provider this evening. Pt agreeable to use it.  No other concerns noted at this time.  A: Pt provided support and encouragement. Pt given scheduled medication as prescribed. PRNs as appropriate. Q15 min checks for safety.   R: Pt safe on the unit. Will continue to monitor.

## 2023-02-14 NOTE — Plan of Care (Signed)
  Problem: Activity: Goal: Imbalance in normal sleep/wake cycle will improve Outcome: Progressing   Problem: Coping: Goal: Coping ability will improve Outcome: Progressing Goal: Will verbalize feelings Outcome: Progressing   Problem: Self-Concept: Goal: Will verbalize positive feelings about self Outcome: Progressing Goal: Level of anxiety will decrease Outcome: Progressing   Problem: Clinical Measurements: Goal: Will remain free from infection Outcome: Progressing   Problem: Nutrition: Goal: Adequate nutrition will be maintained Outcome: Progressing

## 2023-02-14 NOTE — Plan of Care (Signed)
  Problem: Coping: Goal: Level of anxiety will decrease Outcome: Progressing   Problem: Elimination: Goal: Will not experience complications related to urinary retention Outcome: Progressing   

## 2023-02-15 DIAGNOSIS — F332 Major depressive disorder, recurrent severe without psychotic features: Secondary | ICD-10-CM | POA: Diagnosis not present

## 2023-02-15 NOTE — Progress Notes (Signed)
 [facility] MD Progress Note  02/15/2023  Caitlin Rivera  MRN:  [id number]  68 year old female admitted to the unit voluntarily for concerns having a nervious breakdown and SI without a plan. 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  Patient reports she is doing better   She denies thoughts of harming herself or others which is an improvement from yesterday. Patient reports she doesn't want to go to a rehab program. She shared she might have a place to go to tomorrow. Patient was encouraged to attend group and work on coping strategies and a safe discharge plan.  Patient denies manic or psychotic symptoms. She has been attending groups. No new acute issues overnight.   Principal Problem: MDD (major depressive disorder), recurrent severe, without psychosis (HCC) Diagnosis: Principal Problem:   MDD (major depressive disorder), recurrent severe, without psychosis (HCC) Active Problems:   Depression   Past Psychiatric History: Prev Dx/Sx: Depression Current Psych Provider: None reported Home Meds (current): Unable to recall Previous Med Trials: Unable to recall Therapy: None reported   Prior Psych Hospitalization: None reported Prior Self Harm: None reported Prior Violence: None reported  Past Medical History:  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   COPD (chronic obstructive pulmonary disease) (HCC)    smoker   Diabetes mellitus without complication (HCC)    Fibroids    Hypertension    Personal history of radiation therapy    Smoker     Past Surgical History:  Procedure Laterality Date   BREAST BIOPSY Bilateral 06/03/2019   BREAST BIOPSY Left 06/14/2019   BREAST EXCISIONAL BIOPSY Right 07/2019   BREAST LUMPECTOMY Left 07/2019   BREAST LUMPECTOMY WITH RADIOACTIVE SEED LOCALIZATION Bilateral 07/31/2019   Procedure:  BILATERAL BREAST LUMPECTOMY WITH RADIOACTIVE SEED LOCALIZATION;  Surgeon: Curvin Deward MOULD, MD;  Location: [facility];  Service: General;  Laterality: Bilateral;   TUBAL LIGATION     TUBAL LIGATION     Family History:  Family History  Problem Relation Age of Onset   Cancer Mother    Family Psychiatric  History: Patient reports ,multiple family members with mental health history and substance use history both maternal and paternal side  Social History:  Social History   Substance and Sexual Activity  Alcohol Use No     Social History   Substance and Sexual Activity  Drug Use Yes   Types: Crack cocaine   Comment: crack-last smoked 2 wks ago-07-24-19, states she uses once a month    Social History   Socioeconomic History   Marital status: Widowed    Spouse name: Not on file   Number of children: Not on file   Years of education: Not on file   Highest education level: Not on file  Occupational History   Not on file  Tobacco Use   Smoking status: Every Day    Current packs/day: 0.50    Types: Cigarettes   Smokeless tobacco: Never  Substance and Sexual Activity   Alcohol use: No   Drug use: Yes    Types: Crack cocaine    Comment: crack-last smoked 2 wks ago-07-24-19, states she uses once a month   Sexual activity: Not on file  Other Topics Concern   Not on file  Social History Narrative   Not on file   Social Drivers  of Health   Financial Resource Strain: Not on file  Food Insecurity: Patient Declined (02/11/2023)   Hunger Vital Sign    Worried About Running Out of Food in the Last Year: Patient declined    Ran Out of Food in the Last Year: Patient declined  Recent Concern: Food Insecurity - Food Insecurity Present (02/04/2023)   Hunger Vital Sign    Worried About Running Out of Food in the Last Year: Sometimes true    Ran Out of Food in the Last Year: Sometimes true  Transportation Needs: Unmet Transportation Needs (02/11/2023)   PRAPARE -  Administrator, Civil Service (Medical): Yes    Lack of Transportation (Non-Medical): Yes  Physical Activity: Not on file  Stress: Not on file  Social Connections: Moderately Isolated (02/11/2023)   Social Connection and Isolation Panel [NHANES]    Frequency of Communication with Friends and Family: Twice a week    Frequency of Social Gatherings with Friends and Family: Twice a week    Attends Religious Services: 1 to 4 times per year    Active Member of [employer] or Organizations: No    Attends Banker Meetings: Never    Marital Status: Widowed                   Sleep: Fair  Appetite:  Fair  Current Medications: Current Facility-Administered Medications  Medication Dose Route Frequency Provider Last Rate Last Admin   acetaminophen  (TYLENOL ) tablet 650 mg  650 mg Oral Q6H PRN Penn, Reymundo, NP   650 mg at 02/14/23 2118   albuterol  (VENTOLIN  HFA) 108 (90 Base) MCG/ACT inhaler 1-2 puff  1-2 puff Inhalation Q6H PRN Onuoha, Chinwendu V, NP   2 puff at 02/14/23 1106   alum & mag hydroxide-simeth (MAALOX/MYLANTA) 200-200-20 MG/5ML suspension 30 mL  30 mL Oral Q4H PRN Penn, Reymundo, NP       amLODipine  (NORVASC ) tablet 5 mg  5 mg Oral Daily Penn, Cicely, NP   5 mg at 02/15/23 9142   anastrozole  (ARIMIDEX ) tablet 1 mg  1 mg Oral Daily Penn, Reymundo, NP   1 mg at 02/15/23 9143   DULoxetine  (CYMBALTA ) DR capsule 20 mg  20 mg Oral BID Sammy Reymundo, NP   20 mg at 02/15/23 9143   gabapentin  (NEURONTIN ) capsule 600 mg  600 mg Oral BID Sammy Reymundo, NP   600 mg at 02/15/23 9143   hydrOXYzine  (ATARAX ) tablet 50 mg  50 mg Oral QHS Massengill, Rankin, MD   50 mg at 02/14/23 2118   magnesium  hydroxide (MILK OF MAGNESIA) suspension 30 mL  30 mL Oral Daily PRN Sammy Reymundo, NP       metFORMIN  (GLUCOPHAGE ) tablet 500 mg  500 mg Oral Q breakfast Penn, Cicely, NP   500 mg at 02/15/23 9142   mometasone -formoterol  (DULERA ) 100-5 MCG/ACT inhaler 2 puff  2 puff Inhalation BID [facility],  Tejas Seawood, MD   2 puff at 02/15/23 9141   OLANZapine  zydis (ZYPREXA ) disintegrating tablet 5 mg  5 mg Oral TID PRN Sammy Reymundo, NP   5 mg at 02/12/23 2213   prazosin  (MINIPRESS ) capsule 1 mg  1 mg Oral QHS Penn, Cicely, NP   1 mg at 02/14/23 2117   rosuvastatin  (CRESTOR ) tablet 20 mg  20 mg Oral Daily Penn, Cicely, NP   20 mg at 02/15/23 0856   tiZANidine  (ZANAFLEX ) tablet 4 mg  4 mg Oral Q8H PRN Sammy Reymundo, NP   4 mg at 02/14/23  2118    Lab Results: No results found for this or any previous visit (from the past 48 hours).  Blood Alcohol level:  Lab Results  Component Value Date   ETH <10 02/10/2023    Metabolic Disorder Labs: Lab Results  Component Value Date   HGBA1C 6.6 (H) 02/03/2023   MPG 142.72 02/03/2023     Musculoskeletal: Strength & Muscle Tone: within normal limits Gait & Station: normal Patient leans: N/A  Psychiatric Specialty Exam:  Presentation  General Appearance:  Improved   Eye Contact: Good   Speech: Spontaneous   Speech Volume: Normal       Mood and Affect  Mood:  Fine   Affect: Stable and mood congruent     Thought Process  Thought Processes: Goal-directed   Descriptions of Associations: Intact   Orientation: Oriented to time, place, and person   Thought Content: Improved   Hallucinations: Denies  Ideas of Reference:None   Suicidal Thoughts: Denies   Homicidal Thoughts: Denies   Sensorium  Memory: Fair   Judgment: Improving   Insight: Improving     Executive Functions  Concentration: Fair   Attention Span: Fair   Recall: [employer] of Knowledge: Fair   Language: Fair     Psychomotor Activity  Psychomotor Activity: Psychomotor Activity: Normal     Assets  Assets: Communication Skills; Desire for Improvement; Resilience       Physical Exam: Physical Exam HENT:     Head: Normocephalic.     Nose: Nose normal.     Mouth/Throat:     Mouth: Mucous membranes are moist.  Eyes:      Pupils: Pupils are equal, round, and reactive to light.  Cardiovascular:     Rate and Rhythm: Normal rate.     Pulses: Normal pulses.  Pulmonary:     Breath sounds: Normal breath sounds.  Abdominal:     General: Bowel sounds are normal.  Skin:    General: Skin is warm.  Neurological:     General: No focal deficit present.     Mental Status: She is alert.      Review of Systems  Constitutional: Negative.   HENT: Negative.    Eyes: Negative.   Respiratory: Negative.    Cardiovascular: Negative.   Gastrointestinal: Negative.   Skin: Negative.   Neurological: Negative.    Blood pressure (!) 119/58, pulse 71, temperature 98.1 F (36.7 C), resp. rate 14, height 5' 6 (1.676 m), weight 104.6 kg, SpO2 94%. Body mass index is 37.2 kg/m.   Treatment Plan Summary: Daily contact with patient to assess and evaluate symptoms and progress in treatment and Medication management  Safety and Monitoring:             -- Involuntary admission to inpatient psychiatric unit for safety, stabilization and treatment             -- Daily contact with patient to assess and evaluate symptoms and progress in treatment             -- Patient's case to be discussed in multi-disciplinary team meeting             -- Observation Level: q15 minute checks             -- Vital signs:  q12 hours             -- Precautions: suicide, elopement, and assault   2. Psychiatric Diagnoses and Treatment:              -  Continue Cymbalta  20 mg twice daily and prazosin  1 mg daily   - Encouraged patient to participate in unit milieu and in scheduled group therapies                          3. Medical Issues Being Addressed:    No urgent medical needs noted at this time 4. Discharge Planning:              -- Social work and case management to assist with discharge planning and identification of hospital follow-up needs prior to discharge             -- Estimated LOS: 1-2  days                         -- Discharge  Goals: Return home with outpatient referrals follow ups.    Wenceslao Harries, MD

## 2023-02-15 NOTE — Plan of Care (Signed)
  Problem: Activity: Goal: Interest or engagement in leisure activities will improve Outcome: Progressing Goal: Imbalance in normal sleep/wake cycle will improve Outcome: Progressing   Problem: Coping: Goal: Coping ability will improve Outcome: Progressing   Problem: Health Behavior/Discharge Planning: Goal: Compliance with therapeutic regimen will improve Outcome: Progressing   Problem: Self-Concept: Goal: Will verbalize positive feelings about self Outcome: Progressing Goal: Level of anxiety will decrease Outcome: Progressing

## 2023-02-15 NOTE — Plan of Care (Signed)
 Cooperative, med compliant, denies SI/HI/AVH, depression 2/10, and anxiety 1/10. 15 min checks performed. Care, comfort and safety maintained/ongoing.  Problem: Education: Goal: Utilization of techniques to improve thought processes will improve Outcome: Progressing Goal: Knowledge of the prescribed therapeutic regimen will improve Outcome: Progressing   Problem: Activity: Goal: Interest or engagement in leisure activities will improve Outcome: Progressing Goal: Imbalance in normal sleep/wake cycle will improve Outcome: Progressing   Problem: Coping: Goal: Coping ability will improve Outcome: Progressing Goal: Will verbalize feelings Outcome: Progressing   Problem: Health Behavior/Discharge Planning: Goal: Ability to make decisions will improve Outcome: Progressing Goal: Compliance with therapeutic regimen will improve Outcome: Progressing   Problem: Role Relationship: Goal: Will demonstrate positive changes in social behaviors and relationships Outcome: Progressing   Problem: Safety: Goal: Ability to disclose and discuss suicidal ideas will improve Outcome: Progressing Goal: Ability to identify and utilize support systems that promote safety will improve Outcome: Progressing   Problem: Self-Concept: Goal: Will verbalize positive feelings about self Outcome: Progressing Goal: Level of anxiety will decrease Outcome: Progressing   Problem: Education: Goal: Knowledge of General Education information will improve Description: Including pain rating scale, medication(s)/side effects and non-pharmacologic comfort measures Outcome: Progressing   Problem: Health Behavior/Discharge Planning: Goal: Ability to manage health-related needs will improve Outcome: Progressing   Problem: Clinical Measurements: Goal: Ability to maintain clinical measurements within normal limits will improve Outcome: Progressing Goal: Will remain free from infection Outcome: Progressing Goal:  Diagnostic test results will improve Outcome: Progressing Goal: Respiratory complications will improve Outcome: Progressing Goal: Cardiovascular complication will be avoided Outcome: Progressing   Problem: Activity: Goal: Risk for activity intolerance will decrease Outcome: Progressing   Problem: Nutrition: Goal: Adequate nutrition will be maintained Outcome: Progressing   Problem: Coping: Goal: Level of anxiety will decrease Outcome: Progressing   Problem: Elimination: Goal: Will not experience complications related to bowel motility Outcome: Progressing Goal: Will not experience complications related to urinary retention Outcome: Progressing   Problem: Pain Managment: Goal: General experience of comfort will improve and/or be controlled Outcome: Progressing   Problem: Safety: Goal: Ability to remain free from injury will improve Outcome: Progressing   Problem: Skin Integrity: Goal: Risk for impaired skin integrity will decrease Outcome: Progressing   Problem: Education: Goal: Ability to state activities that reduce stress will improve Outcome: Progressing   Problem: Coping: Goal: Ability to identify and develop effective coping behavior will improve Outcome: Progressing   Problem: Self-Concept: Goal: Ability to identify factors that promote anxiety will improve Outcome: Progressing Goal: Level of anxiety will decrease Outcome: Progressing Goal: Ability to modify response to factors that promote anxiety will improve Outcome: Progressing

## 2023-02-15 NOTE — Progress Notes (Signed)
   02/15/23 0600  15 Minute Checks  Location Bedroom  Visual Appearance Calm  Behavior Sleeping  Sleep (Behavioral Health Patients Only)  Calculate sleep? (Click Yes once per 24 hr at 0600 safety check) Yes  Documented sleep last 24 hours 9.25

## 2023-02-15 NOTE — Group Note (Signed)
 Date:  02/15/2023 Time:  9:00 PM  Group Topic/Focus:  Self Care:   The focus of this group is to help patients understand the importance of self-care in order to improve or restore emotional, physical, spiritual, interpersonal, and financial health.    Participation Level:  Active  Participation Quality:  Appropriate  Affect:  Appropriate  Cognitive:  Appropriate  Insight: Appropriate  Engagement in Group:  Engaged  Modes of Intervention:  Education  Additional Comments:    Caitlin Rivera 02/15/2023, 9:00 PM

## 2023-02-15 NOTE — Progress Notes (Signed)
   02/15/23 1400  Psych Admission Type (Psych Patients Only)  Admission Status Voluntary  Psychosocial Assessment  Patient Complaints None  Eye Contact Fair  Facial Expression Animated  Affect Preoccupied  Speech Logical/coherent  Interaction Assertive  Motor Activity Slow  Appearance/Hygiene In scrubs  Behavior Characteristics Cooperative  Mood Elated  Thought Process  Coherency Circumstantial  Content Preoccupation  Delusions None reported or observed  Perception WDL  Hallucination None reported or observed  Judgment Limited  Confusion None  Danger to Self  Current suicidal ideation? Denies  Danger to Others  Danger to Others None reported or observed

## 2023-02-16 DIAGNOSIS — F332 Major depressive disorder, recurrent severe without psychotic features: Secondary | ICD-10-CM | POA: Diagnosis not present

## 2023-02-16 MED ORDER — METFORMIN HCL 500 MG PO TABS: 500.0000 mg | ORAL_TABLET | Freq: Every day | ORAL | 0 refills | Status: DC

## 2023-02-16 MED ORDER — PRAZOSIN HCL 1 MG PO CAPS: 1.0000 mg | ORAL_CAPSULE | Freq: Every day | ORAL | 0 refills | Status: DC

## 2023-02-16 MED ORDER — ANASTROZOLE 1 MG PO TABS: 1.0000 mg | ORAL_TABLET | Freq: Every day | ORAL | 0 refills | Status: AC

## 2023-02-16 MED ORDER — ROSUVASTATIN CALCIUM 20 MG PO TABS: 20.0000 mg | ORAL_TABLET | Freq: Every day | ORAL | 0 refills | Status: DC

## 2023-02-16 MED ORDER — TIZANIDINE HCL 4 MG PO TABS: 4.0000 mg | ORAL_TABLET | Freq: Three times a day (TID) | ORAL | 0 refills | Status: AC | PRN

## 2023-02-16 MED ORDER — HYDROXYZINE HCL 50 MG PO TABS: 50.0000 mg | ORAL_TABLET | Freq: Every day | ORAL | 0 refills | Status: DC

## 2023-02-16 MED ORDER — AMLODIPINE BESYLATE 5 MG PO TABS: 5.0000 mg | ORAL_TABLET | Freq: Every day | ORAL | 0 refills | Status: DC

## 2023-02-16 MED ORDER — ALBUTEROL SULFATE HFA 108 (90 BASE) MCG/ACT IN AERS: 1.0000 | INHALATION_SPRAY | Freq: Four times a day (QID) | RESPIRATORY_TRACT | 0 refills | Status: AC | PRN

## 2023-02-16 MED ORDER — GABAPENTIN 300 MG PO CAPS: 600.0000 mg | ORAL_CAPSULE | Freq: Two times a day (BID) | ORAL | 0 refills | Status: DC

## 2023-02-16 MED ORDER — DULOXETINE HCL 20 MG PO CPEP: 20.0000 mg | ORAL_CAPSULE | Freq: Two times a day (BID) | ORAL | 0 refills | Status: DC

## 2023-02-16 NOTE — Care Management Important Message (Signed)
 Important Message  Patient Details  Name: Caitlin Rivera MRN: [id number] Date of Birth: 02-09-1955   Important Message Given:  Yes - Medicare IM     Claudio Culver, LCSWA 02/16/2023, 11:39 AM

## 2023-02-16 NOTE — Progress Notes (Signed)
   02/16/23 0600  15 Minute Checks  Location Bedroom  Visual Appearance Calm  Behavior Sleeping  Sleep (Behavioral Health Patients Only)  Calculate sleep? (Click Yes once per 24 hr at 0600 safety check) Yes  Documented sleep last 24 hours 8.75

## 2023-02-16 NOTE — Progress Notes (Signed)
  [facility] Adult Case Management Discharge Plan :  Will you be returning to the same living situation after discharge:  No.Pt will be living with a friend  At discharge, do you have transportation home?: Yes,  pt's friend will come pick her up  Do you have the ability to pay for your medications: Yes,  [employer] MEDICARE / DREMA DUAL COMPLETE  Release of information consent forms completed and in the chart;  Patient's signature needed at discharge.  Patient to Follow up at:  Follow-up Information     [facility]. Go to.   Why: Although you declined a scheduled follow-up appointment, I am putting walk-in hours for [facility] per your request.  Walk-In Crisis Hours: Monday-Friday, 8am-5pm If you come in after 3pm, you will meet briefly with a clinician, but will most likely be asked to come back tomorrow, because our first time assessment takes about 2 hours. Contact information: [address] [place] [address] [phone]         [facility]. Go to.   Why: Although you declined scheduled follow-up per your request I have also referred you to [facility]. Please reach out to them if you would like to be scheduled.  [address], [place], [place] [address] [phone]                Next level of care provider has access to [facility] Link:no  Safety Planning and Suicide Prevention discussed: Yes,  although pt declined SPE, SPE brochure gone over with pt      Has patient been referred to the Quitline?: Patient refused referral for treatment  Patient has been referred for addiction treatment: Patient refused referral for treatment.  [address], LCSWA 02/16/2023, 12:02 PM

## 2023-02-16 NOTE — Plan of Care (Signed)
  Problem: Education: Goal: Utilization of techniques to improve thought processes will improve Outcome: Progressing Goal: Knowledge of the prescribed therapeutic regimen will improve Outcome: Progressing   Problem: Activity: Goal: Interest or engagement in leisure activities will improve Outcome: Progressing Goal: Imbalance in normal sleep/wake cycle will improve Outcome: Progressing   Problem: Coping: Goal: Coping ability will improve Outcome: Progressing Goal: Will verbalize feelings Outcome: Progressing   Problem: Health Behavior/Discharge Planning: Goal: Ability to make decisions will improve Outcome: Progressing Goal: Compliance with therapeutic regimen will improve Outcome: Progressing   Problem: Role Relationship: Goal: Will demonstrate positive changes in social behaviors and relationships Outcome: Progressing   Problem: Safety: Goal: Ability to disclose and discuss suicidal ideas will improve Outcome: Progressing Goal: Ability to identify and utilize support systems that promote safety will improve Outcome: Progressing   Problem: Self-Concept: Goal: Will verbalize positive feelings about self Outcome: Progressing Goal: Level of anxiety will decrease Outcome: Progressing   Problem: Education: Goal: Knowledge of General Education information will improve Description: Including pain rating scale, medication(s)/side effects and non-pharmacologic comfort measures Outcome: Progressing   Problem: Health Behavior/Discharge Planning: Goal: Ability to manage health-related needs will improve Outcome: Progressing   Problem: Clinical Measurements: Goal: Ability to maintain clinical measurements within normal limits will improve Outcome: Progressing Goal: Will remain free from infection Outcome: Progressing Goal: Diagnostic test results will improve Outcome: Progressing Goal: Respiratory complications will improve Outcome: Progressing Goal: Cardiovascular  complication will be avoided Outcome: Progressing   Problem: Activity: Goal: Risk for activity intolerance will decrease Outcome: Progressing   Problem: Nutrition: Goal: Adequate nutrition will be maintained Outcome: Progressing   Problem: Coping: Goal: Level of anxiety will decrease Outcome: Progressing   Problem: Elimination: Goal: Will not experience complications related to bowel motility Outcome: Progressing Goal: Will not experience complications related to urinary retention Outcome: Progressing   Problem: Pain Managment: Goal: General experience of comfort will improve and/or be controlled Outcome: Progressing   Problem: Safety: Goal: Ability to remain free from injury will improve Outcome: Progressing   Problem: Skin Integrity: Goal: Risk for impaired skin integrity will decrease Outcome: Progressing   Problem: Education: Goal: Ability to state activities that reduce stress will improve Outcome: Progressing   Problem: Coping: Goal: Ability to identify and develop effective coping behavior will improve Outcome: Progressing   Problem: Self-Concept: Goal: Ability to identify factors that promote anxiety will improve Outcome: Progressing Goal: Level of anxiety will decrease Outcome: Progressing Goal: Ability to modify response to factors that promote anxiety will improve Outcome: Progressing

## 2023-02-16 NOTE — Discharge Summary (Signed)
 Physician Discharge Summary Note  Patient:  Caitlin Rivera is an 68 y.o., female MRN:  [id number] DOB:  [birth year] Patient phone:  [phone] (home)  Patient address:   [address] [place] [address],    Date of Admission:  02/11/2023 Date of Discharge: 02/16/2023  Reason for Admission:  68 year old female admitted to the unit voluntarily for concerns having a nervious breakdown and SI without a plan. Reports using crack cocaine x 5 days and homelessness.   Principal Problem: MDD (major depressive disorder), recurrent severe, without psychosis (HCC) Discharge Diagnoses: Principal Problem:   MDD (major depressive disorder), recurrent severe, without psychosis (HCC) Active Problems:   Depression   Past Psychiatric History: Prev Dx/Sx: Depression Current Psych Provider: None reported Home Meds (current): Unable to recall Previous Med Trials: Unable to recall Therapy: None reported   Prior Psych Hospitalization: None reported Prior Self Harm: None reported Prior Violence: None reported  Past Medical History:  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   COPD (chronic obstructive pulmonary disease) (HCC)    smoker   Diabetes mellitus without complication (HCC)    Fibroids    Hypertension    Personal history of radiation therapy    Smoker     Past Surgical History:  Procedure Laterality Date   BREAST BIOPSY Bilateral 06/03/2019   BREAST BIOPSY Left 06/14/2019   BREAST EXCISIONAL BIOPSY Right 07/2019   BREAST LUMPECTOMY Left 07/2019   BREAST LUMPECTOMY WITH RADIOACTIVE SEED LOCALIZATION Bilateral 07/31/2019   Procedure: BILATERAL BREAST LUMPECTOMY WITH RADIOACTIVE SEED LOCALIZATION;  Surgeon: Curvin Deward MOULD, MD;  Location: [facility];  Service: General;  Laterality: Bilateral;   TUBAL LIGATION     TUBAL LIGATION     Family History:  Family History  Problem Relation Age of Onset   Cancer Mother     Family Psychiatric  History: Multiple family members with mental health history and substance use history both maternal and paternal side Family Hx suicide: None reported  Social History:  Social History   Substance and Sexual Activity  Alcohol Use No     Social History   Substance and Sexual Activity  Drug Use Yes   Types: Crack cocaine   Comment: crack-last smoked 2 wks ago-07-24-19, states she uses once a month    Social History   Socioeconomic History   Marital status: Widowed    Spouse name: Not on file   Number of children: Not on file   Years of education: Not on file   Highest education level: Not on file  Occupational History   Not on file  Tobacco Use   Smoking status: Every Day    Current packs/day: 0.50    Types: Cigarettes   Smokeless tobacco: Never  Substance and Sexual Activity   Alcohol use: No   Drug use: Yes    Types: Crack cocaine    Comment: crack-last smoked 2 wks ago-07-24-19, states she uses once a month   Sexual activity: Not on file  Other Topics Concern   Not on file  Social History Narrative   Not on file   Social Drivers of Health   Financial Resource Strain: Not on file  Food Insecurity: Patient Declined (02/11/2023)   Hunger Vital Sign    Worried About Running Out of Food in the Last Year: Patient declined    Ran Out of Food in the Last Year: Patient declined  Recent Concern: Food Insecurity - Food Insecurity Present (02/04/2023)   Hunger Vital Sign    Worried About Running Out of Food in the Last Year: Sometimes true    Ran Out of Food in the Last Year: Sometimes true  Transportation Needs: Unmet Transportation Needs (02/11/2023)   PRAPARE - Administrator, Civil Service (Medical): Yes    Lack of Transportation (Non-Medical): Yes  Physical Activity: Not on file  Stress: Not on file  Social Connections: Moderately Isolated (02/11/2023)   Social Connection and Isolation Panel [NHANES]    Frequency of Communication  with Friends and Family: Twice a week    Frequency of Social Gatherings with Friends and Family: Twice a week    Attends Religious Services: 1 to 4 times per year    Active Member of [employer] or Organizations: No    Attends Banker Meetings: Never    Marital Status: Widowed    Hospital Course:  The patient was admitted to Inpatient psychiatric treatment for stabilization of depression and suicide thoughts.  Patient was placed on suicidal precautions. The patient was evaluated and treated by the multidisciplinary treatment team including physicians, nurses, social workers and therapists. All medications were presented to the patient and the Patient gave consent to all the medications that they were given, as well as was explained the risks, benefits, side effects and alternatives of all medication therapies. The patient was integrated into the general milieu on the ward and encouraged to attend to her ADLs and participate in all groups and activities. During hospital course the Patient attended coping skill groups, music therapy and activity therapy groups. Patient was counseled on cognitive techniques/skills by multiple staff members and given support care by the staff.   Patient's medication regimen was evaluated and titrated to therapeutic levels to better Patient's overall daily functioning. Specifically, the patient was started on duloxetine , gabapentin , hydroxyzine , prazosin , and her home medicines for hypertension and diabetes.  She tolerated the medication well with no significant side effects.  Patient was encouraged to abstain from use of cocaine.  Patient was offered to consider inpatient rehab program which she declined.  During the hospitalization, the patient demonstrated a stabilization of mood with decreased racing thoughts, decreased impulsivity, improved sleep and decreased irritability. At the time of discharge, the patient denied any suicidal ideation/homicidal ideation and  was not overtly depressed, manic or psychotic. The Patient was interacting well in groups and on the unit with their peers. Patient was able to identify a safety plan to include speaking with family, contacting outpatient provider or calling 911 if hallucinations/delusions returned or worsened or thoughts of self-harm or suicide return. Patient was counselled on outpatient follow-up that was arranged prior to discharge.  Musculoskeletal: Strength & Muscle Tone: within normal limits Gait & Station: normal Patient leans: N/A   Psychiatric Specialty Exam:   Presentation  General Appearance:  Improved   Eye Contact: Good   Speech: Spontaneous   Speech Volume: Normal       Mood and Affect  Mood:  Fine   Affect: Stable and mood congruent     Thought Process  Thought Processes: Goal-directed   Descriptions of Associations: Intact   Orientation: Oriented to time, place, and person   Thought Content: Improved   Hallucinations: Denies   Ideas of Reference:None   Suicidal Thoughts: Denies   Homicidal Thoughts: Denies    Sensorium  Memory: Fair   Judgment: Improving   Insight: Improving     Executive  Functions  Concentration: Fair   Attention Span: Fair   Recall: [employer] of Knowledge: Fair   Language: Fair     Psychomotor Activity  Psychomotor Activity: Psychomotor Activity: Normal     Assets  Assets: Manufacturing Systems Engineer; Desire for Improvement; Resilience       Physical Exam: Physical Exam HENT:     Head: Normocephalic.     Nose: Nose normal.     Mouth/Throat:     Mouth: Mucous membranes are moist.  Eyes:     Pupils: Pupils are equal, round, and reactive to light.  Cardiovascular:     Rate and Rhythm: Normal rate.     Pulses: Normal pulses.  Pulmonary:     Breath sounds: Normal breath sounds.  Abdominal:     General: Bowel sounds are normal.  Skin:    General: Skin is warm.  Neurological:     General: No focal  deficit present.     Mental Status: She is alert.      Review of Systems  Constitutional: Negative.   HENT: Negative.    Eyes: Negative.   Respiratory: Negative.    Cardiovascular: Negative.   Gastrointestinal: Negative.   Skin: Negative.   Neurological: Negative.     Blood pressure (!) 114/59, pulse 65, temperature 98.1 F (36.7 C), resp. rate 17, height 5' 6 (1.676 m), weight 104.6 kg, SpO2 95%. Body mass index is 37.2 kg/m.   Social History   Tobacco Use  Smoking Status Every Day   Current packs/day: 0.50   Types: Cigarettes  Smokeless Tobacco Never   Tobacco Cessation:  N/A, patient does not currently use tobacco products   Blood Alcohol level:  Lab Results  Component Value Date   ETH <10 02/10/2023    Metabolic Disorder Labs:  Lab Results  Component Value Date   HGBA1C 6.6 (H) 02/03/2023   MPG 142.72 02/03/2023   See Psychiatric Specialty Exam and Suicide Risk Assessment completed by Attending Physician prior to discharge.  Discharge destination:  Home  Recommended Plan for Multiple Antipsychotic Therapies: NA   Allergies as of 02/16/2023       Reactions   Morphine  And Codeine Itching   Ibuprofen Itching        Medication List     STOP taking these medications    acetaminophen  650 MG CR tablet Commonly known as: Tylenol  8 Hour   gabapentin  600 MG tablet Commonly known as: NEURONTIN  Replaced by: gabapentin  300 MG capsule   guaiFENesin -dextromethorphan  100-10 MG/5ML syrup Commonly known as: ROBITUSSIN DM   hydrOXYzine  50 MG capsule Commonly known as: VISTARIL    OVER THE COUNTER MEDICATION       TAKE these medications      Indication  albuterol  108 (90 Base) MCG/ACT inhaler Commonly known as: VENTOLIN  HFA Inhale 1-2 puffs into the lungs every 6 (six) hours as needed for wheezing or shortness of breath.    amLODipine  5 MG tablet Commonly known as: NORVASC  Take 1 tablet (5 mg total) by mouth daily.    anastrozole  1 MG  tablet Commonly known as: ARIMIDEX  Take 1 tablet (1 mg total) by mouth daily.    DULoxetine  20 MG capsule Commonly known as: CYMBALTA  Take 1 capsule (20 mg total) by mouth 2 (two) times daily.    gabapentin  300 MG capsule Commonly known as: NEURONTIN  Take 2 capsules (600 mg total) by mouth 2 (two) times daily. Replaces: gabapentin  600 MG tablet    hydrOXYzine  50 MG tablet Commonly known as:  ATARAX  Take 1 tablet (50 mg total) by mouth at bedtime.    metFORMIN  500 MG tablet Commonly known as: GLUCOPHAGE  Take 1 tablet (500 mg total) by mouth daily with breakfast. Start taking on: February 17, 2023    prazosin  1 MG capsule Commonly known as: MINIPRESS  Take 1 capsule (1 mg total) by mouth at bedtime.    rosuvastatin  20 MG tablet Commonly known as: CRESTOR  Take 1 tablet (20 mg total) by mouth daily.    Symbicort  160-4.5 MCG/ACT inhaler Generic drug: budesonide-formoterol  Inhale 2 puffs into the lungs in the morning and at bedtime.    tiZANidine  4 MG tablet Commonly known as: ZANAFLEX  Take 1 tablet (4 mg total) by mouth every 8 (eight) hours as needed for muscle spasms. What changed: reasons to take this         PATIENTS CONDITION AT DISCHARGE: Stable TOBACCO CESSATION SCREENING  Patient was screened and counselled on smoking cessation at time of discharge.  Pt is not a smoker or smokes less than a quarter pack per day and have verbalized that they do not require assistance with tobacco cessation at this time.   Patient was screened and counselled on tobacco cessation at time of discharge. Patient verbalized a desire for medical assistance with tobacco cessation. Patient has been provided with Prescription medication for tobacco cessation and these medications are listed in Patient's discharge medication list.  PRESCRIPTION ARE LOCATED: On Chart  DISCHARGE INSTRUCTIONS: Diet: Cardiac healthy and diabetic Activity: As tolerated Take medications as prescribed and not to  make any changes without first consulting with the outpatient provider. Patient was advised to avoid any illicit drugs or alcohol due to negative impact on physical and mental health.  Patient should keep all follow up appointments.  TIME SPENT ON DISCHARGE: Over 35 minutes were spent on this patient's discharge including a face-to-face encounter, patient counseling, and preparation of discharge materials.     Signed: Wenceslao Harries, MD 02/16/2023, 11:02 AM

## 2023-02-16 NOTE — Group Note (Signed)
 Recreation Therapy Group Note   Group Topic:Relaxation  Group Date: 02/16/2023 Start Time: 1100 End Time: 1135 Facilitators: Celestia Jeoffrey BRAVO, LRT, CTRS Location:  Dayroom  Group Description: Chair Yoga. LRT and patients discussed the benefits of yoga and how it differs from strength exercises. LRT educated patients on the mental and physical benefits of yoga and deep breathing and how it can be used as a associate professor. LRT and patients followed along to a guided yoga session on the television that focused on all parts of the body, as well as deep breathing. Pt encouraged to stop movement at any time if they feel discomfort or pain.   Goal Area(s) Addressed: Patient will practice using relaxation technique. Patient will identify a new coping skill.  Patient will follow multistep directions to reduce anxiety and stress.   Affect/Mood: Appropriate and Flat   Participation Level: Moderate   Participation Quality: Independent   Behavior: Cooperative   Speech/Thought Process: Coherent   Insight: Fair   Judgement: Fair    Modes of Intervention: Activity, Education, and Exploration   Patient Response to Interventions:  Receptive   Education Outcome:  In group clarification offered    Clinical Observations/Individualized Feedback: Caitlin Rivera was mostly active in their participation of session activities and group discussion. Pt completed most of the exercises as shown. Pt minimally interacted with LRT and peers while in group.    Plan: Continue to engage patient in RT group sessions 2-3x/week.   [address], LRT, CTRS 02/16/2023 1:52 PM

## 2023-02-16 NOTE — BHH Suicide Risk Assessment (Signed)
 [facility] Discharge Suicide Risk Assessment   Principal Problem: MDD (major depressive disorder), recurrent severe, without psychosis (HCC) Discharge Diagnoses: Principal Problem:   MDD (major depressive disorder), recurrent severe, without psychosis (HCC) Active Problems:   Depression  Musculoskeletal: Strength & Muscle Tone: within normal limits Gait & Station: normal Patient leans: N/A   Psychiatric Specialty Exam:   Presentation  General Appearance:  Improved   Eye Contact: Good   Speech: Spontaneous   Speech Volume: Normal       Mood and Affect  Mood:  Fine   Affect: Stable and mood congruent     Thought Process  Thought Processes: Goal-directed   Descriptions of Associations: Intact   Orientation: Oriented to time, place, and person   Thought Content: Improved   Hallucinations: Denies   Ideas of Reference:None   Suicidal Thoughts: Denies   Homicidal Thoughts: Denies    Sensorium  Memory: Fair   Judgment: Improving   Insight: Improving     Executive Functions  Concentration: Fair   Attention Span: Fair   Recall: [employer] of Knowledge: Fair   Language: Fair     Psychomotor Activity  Psychomotor Activity: Psychomotor Activity: Normal     Assets  Assets: Communication Skills; Desire for Improvement; Resilience       Physical Exam: Physical Exam HENT:     Head: Normocephalic.     Nose: Nose normal.     Mouth/Throat:     Mouth: Mucous membranes are moist.  Eyes:     Pupils: Pupils are equal, round, and reactive to light.  Cardiovascular:     Rate and Rhythm: Normal rate.     Pulses: Normal pulses.  Pulmonary:     Breath sounds: Normal breath sounds.  Abdominal:     General: Bowel sounds are normal.  Skin:    General: Skin is warm.  Neurological:     General: No focal deficit present.     Mental Status: She is alert.      Review of Systems  Constitutional: Negative.   HENT: Negative.    Eyes:  Negative.   Respiratory: Negative.    Cardiovascular: Negative.   Gastrointestinal: Negative.   Skin: Negative.   Neurological: Negative.        Blood pressure (!) 114/59, pulse 65, temperature 98.1 F (36.7 C), resp. rate 17, height 5' 6 (1.676 m), weight 104.6 kg, SpO2 95%. Body mass index is 37.2 kg/m.    Demographic Factors:  Age 66 or older, Living alone, and Unemployed  Loss Factors: Decrease in vocational status  Historical Factors: Impulsivity  Risk Reduction Factors:   Positive therapeutic relationship and Positive coping skills or problem solving skills  Continued Clinical Symptoms:  Previous Psychiatric Diagnoses and Treatments  Cognitive Features That Contribute To Risk:  Thought constriction (tunnel vision)    Suicide Risk:  Minimal: No identifiable suicidal ideation.      Plan Of Care/Follow-up recommendations:  Per Discharge Summary   Wenceslao Harries, MD 02/16/2023, 10:59 AM

## 2023-02-16 NOTE — Progress Notes (Signed)
Discharge Note:  Patient denies SI/HI/AVH at this time. Discharge instructions, AVS, prescriptions, and transition record gone over with patient. Patient agrees to comply with medication management, follow-up visit, and outpatient therapy. Patient belongings returned to patient. Patient questions and concerns addressed and answered. Patient ambulatory off unit. Patient discharged to home with parents.

## 2023-02-16 NOTE — Progress Notes (Signed)
   02/16/23 0900  Psych Admission Type (Psych Patients Only)  Admission Status Voluntary  Psychosocial Assessment  Patient Complaints None  Eye Contact Fair  Facial Expression Other (Comment) (WNL)  Affect Appropriate to circumstance  Speech Logical/coherent  Interaction Minimal  Motor Activity Slow  Appearance/Hygiene In scrubs  Behavior Characteristics Cooperative  Mood Pleasant  Aggressive Behavior  Effect No apparent injury  Thought Process  Coherency WDL  Content WDL  Delusions None reported or observed  Perception WDL  Hallucination None reported or observed  Judgment WDL  Confusion WDL  Danger to Self  Current suicidal ideation? Denies  Agreement Not to Harm Self Yes  Description of Agreement verbal  Danger to Others  Danger to Others None reported or observed

## 2023-02-27 ENCOUNTER — Ambulatory Visit: Payer: Self-pay | Admitting: Family Medicine

## 2023-04-21 ENCOUNTER — Encounter: Payer: Self-pay | Admitting: Emergency Medicine

## 2023-04-21 ENCOUNTER — Other Ambulatory Visit: Payer: Self-pay

## 2023-04-21 ENCOUNTER — Emergency Department
Admission: EM | Admit: 2023-04-21 | Discharge: 2023-04-21 | Disposition: A | Discharge status: Home / Self Care | Attending: Emergency Medicine | Admitting: Emergency Medicine

## 2023-04-21 DIAGNOSIS — H6121 Impacted cerumen, right ear: Secondary | ICD-10-CM | POA: Diagnosis not present

## 2023-04-21 DIAGNOSIS — H6091 Unspecified otitis externa, right ear: Secondary | ICD-10-CM | POA: Diagnosis not present

## 2023-04-21 DIAGNOSIS — L538 Other specified erythematous conditions: Secondary | ICD-10-CM

## 2023-04-21 DIAGNOSIS — H9201 Otalgia, right ear: Secondary | ICD-10-CM | POA: Diagnosis present

## 2023-04-21 MED ORDER — CARBAMIDE PEROXIDE 6.5 % OT SOLN
5.0000 [drp] | OTIC | Status: AC
  Administered 2023-04-21: 5 [drp] via OTIC
  Filled 2023-04-21: qty 15

## 2023-04-21 MED ORDER — CEPHALEXIN 500 MG PO CAPS: 500.0000 mg | ORAL_CAPSULE | Freq: Four times a day (QID) | ORAL | 0 refills | Status: DC

## 2023-04-21 NOTE — ED Notes (Signed)
 Patient states that a couple days ago, she woke up with ear swelling. Patient states that she hasn't been able to hear out of the ear since. Pt states that the sensation that she feels is deeper inside the ear. Pt drowsy on presentation, stating that she took her medication this morning and it makes her sleepy. Pt with no signs of acute distress at this time.

## 2023-04-21 NOTE — ED Triage Notes (Signed)
 Pt via POV from home. Pt c/o R hearing loss for the past 2 days, reports her ear is swollen but denies pain. States a bug may have bit it. Pt is A&Ox4 and NAD, ambulatory to triage.

## 2023-04-21 NOTE — Discharge Instructions (Signed)
 I am suspicious that you may have a very slight amount of skin infection around your outer ear.  Will start her on antibiotic called cephalexin.  Please return to the emergency room right away if you develop any severe swelling, loss of hearing, severe headache, fevers chills spreading redness over the face or other concerns arise.  Please go to the following website to schedule new (and existing) patient appointments:   [email or web address]   The following is a list of primary care offices in the area who are accepting new patients at this time.  Please reach out to one of them directly and let them know you would like to schedule an appointment to follow up on an Emergency Department visit, and/or to establish a new primary care provider (PCP).  There are likely other primary care clinics in the are who are accepting new patients, but this is an excellent place to start:  [facility] Lead physician: Dr Shirlee Latch [address] [place], [place] [address] [phone]  [facility] Lead Physician: Dr Alba Cory [address], [place], [place] [address] [phone]  [facility]  Lead Physician: Dr Olevia Perches [address] [place], [place] [address] [phone]  [facility] Lead Physician: Dr Sofie Hartigan [address], [place], [place] [address] [phone]  [facility] Primary Care & Sports Medicine at [facility] Lead Physician: Dr Bari Edward [address] [place], [place], [place] [address] [phone]

## 2023-04-21 NOTE — ED Provider Notes (Signed)
 [facility] Provider Note    Event Date/Time   First MD Initiated Contact with Patient 04/21/23 [phone]     (approximate)   History   Hearing Problem   HPI  Caitlin Rivera is a 68 y.o. female who is being variance in sensation of a fullness in the dulling of hearing in her right ear for the last 2 days.   Patient reports she feels like of feeling of slight swollen this and a little bit of discomfort in her ear, she specifically points at the tragus.  She also reports it feels like a diminished but not loss of hearing.  No headache.  Otherwise feels well.  No fevers or chills  Physical Exam   Triage Vital Signs: ED Triage Vitals  Encounter Vitals Group     BP 04/21/23 0943 (!) 104/58     Systolic BP Percentile --      Diastolic BP Percentile --      Pulse Rate 04/21/23 0940 77     Resp 04/21/23 0940 18     Temp 04/21/23 0940 98.3 F (36.8 C)     Temp Source 04/21/23 0940 Oral     SpO2 04/21/23 0940 94 %     Weight 04/21/23 0939 260 lb (117.9 kg)     Height 04/21/23 0939 5\' 6"  (1.676 m)     Head Circumference --      Peak Flow --      Pain Score 04/21/23 0938 0     Pain Loc --      Pain Education --      Exclude from Growth Chart --     Most recent vital signs: Vitals:   04/21/23 0940 04/21/23 0943  BP:  (!) 104/58  Pulse: 77   Resp: 18   Temp: 98.3 F (36.8 C)   SpO2: 94%      General: Awake, no distress.   We have right external auditory canal has cerumen impaction.  She is able to discriminate sound but reports it has a increased dullness in the right ear compared to the left.  The left auditory canal has some mild amount of cerumen but patent.  On the inspection of the right ear externally is normal except she reports a tenderness and there may be some very slight erythema over the tragus where she identifies tenderness    CV:  Good peripheral perfusion.  Resp:  Normal effort.  Abd:  No distention.  Other:  Sits up  without difficulty.  Moves all extremities well with normal strength.  She is very pleasant.  Extraocular movements are normal, face is symmetric.  There is no edema noted of the face.  There is no tenderness over the mastoid process or erythema or edema surrounding the right temporal region.  There is no right temporal tenderness   ED Results / Procedures / Treatments   Labs (all labs ordered are listed, but only abnormal results are displayed) Labs Reviewed - No data to display   EKG     RADIOLOGY     PROCEDURES:  Critical Care performed: No  Procedures   MEDICATIONS ORDERED IN ED: Medications  carbamide peroxide (DEBROX) 6.5 % OTIC (EAR) solution 5 drop (5 drops Right EAR Given 04/21/23 1007)     IMPRESSION / MDM / ASSESSMENT AND PLAN / ED COURSE  I reviewed the triage vital signs and the nursing notes.  Differential diagnosis includes, but is not limited to, cerumen impaction, possible mild inflammation of the tragus or potential very early or mild cellulitic change in the region, likely mechanical in nature and that she has cerumen impaction causing dullness of hearing.  She does not have dense sensory loss no associated neurologic symptoms or deficits.  Discussed with the patient will trial Debrox, irrigation and attempt to carefully remove cerumen.  Given the mild inflammation of the tragus, will start on cephalexin and then patient to monitor..  Patient's presentation is most consistent with acute, uncomplicated illness.   Patient advised currently does not have a primary care physician.  Discussed and also placed referral.  Also provided PCP options and phone  Patient comfortable with return precautions.  Feels well.  After irrigation the right ear canal is widely patent tympanic membrane is normal and the patient reports that her hearing has been restored.  There is very mild erythema of the tragus question if that is from her  frequently touching it up very early cellulitic change possible       FINAL CLINICAL IMPRESSION(S) / ED DIAGNOSES   Final diagnoses:  Impacted cerumen of right ear  Erythema of external ear     Rx / DC Orders   ED Discharge Orders          Ordered    Ambulatory Referral to Primary Care (Establish Care)        04/21/23 1141    cephALEXin (KEFLEX) 500 MG capsule  4 times daily        04/21/23 1141             Note:  This document was prepared using Dragon voice recognition software and may include unintentional dictation errors.   Sharyn Creamer, MD 04/21/23 1143

## 2023-05-10 DIAGNOSIS — I10 Essential (primary) hypertension: Secondary | ICD-10-CM | POA: Diagnosis not present

## 2023-05-10 DIAGNOSIS — F17210 Nicotine dependence, cigarettes, uncomplicated: Secondary | ICD-10-CM | POA: Diagnosis not present

## 2023-05-10 DIAGNOSIS — Z6838 Body mass index (BMI) 38.0-38.9, adult: Secondary | ICD-10-CM | POA: Diagnosis not present

## 2023-05-10 DIAGNOSIS — J449 Chronic obstructive pulmonary disease, unspecified: Secondary | ICD-10-CM | POA: Diagnosis not present

## 2023-05-10 DIAGNOSIS — Z853 Personal history of malignant neoplasm of breast: Secondary | ICD-10-CM | POA: Diagnosis not present

## 2023-05-10 DIAGNOSIS — E1169 Type 2 diabetes mellitus with other specified complication: Secondary | ICD-10-CM | POA: Diagnosis not present

## 2023-05-10 DIAGNOSIS — F1420 Cocaine dependence, uncomplicated: Secondary | ICD-10-CM | POA: Diagnosis not present

## 2023-05-10 DIAGNOSIS — E785 Hyperlipidemia, unspecified: Secondary | ICD-10-CM | POA: Diagnosis not present

## 2023-05-10 DIAGNOSIS — Z008 Encounter for other general examination: Secondary | ICD-10-CM | POA: Diagnosis not present

## 2023-05-10 DIAGNOSIS — F331 Major depressive disorder, recurrent, moderate: Secondary | ICD-10-CM | POA: Diagnosis not present

## 2023-06-15 ENCOUNTER — Emergency Department
Admission: EM | Admit: 2023-06-15 | Discharge: 2023-06-17 | Disposition: A | Discharge status: Other Acute Inpatient Hospital | Attending: Emergency Medicine | Admitting: Emergency Medicine

## 2023-06-15 ENCOUNTER — Other Ambulatory Visit: Payer: Self-pay

## 2023-06-15 DIAGNOSIS — F32A Depression, unspecified: Secondary | ICD-10-CM | POA: Insufficient documentation

## 2023-06-15 DIAGNOSIS — F39 Unspecified mood [affective] disorder: Secondary | ICD-10-CM

## 2023-06-15 DIAGNOSIS — F29 Unspecified psychosis not due to a substance or known physiological condition: Secondary | ICD-10-CM | POA: Diagnosis not present

## 2023-06-15 DIAGNOSIS — Z5900 Homelessness unspecified: Secondary | ICD-10-CM

## 2023-06-15 DIAGNOSIS — R45851 Suicidal ideations: Secondary | ICD-10-CM | POA: Diagnosis not present

## 2023-06-15 DIAGNOSIS — Z79899 Other long term (current) drug therapy: Secondary | ICD-10-CM | POA: Insufficient documentation

## 2023-06-15 DIAGNOSIS — F1590 Other stimulant use, unspecified, uncomplicated: Secondary | ICD-10-CM | POA: Diagnosis not present

## 2023-06-15 DIAGNOSIS — I10 Essential (primary) hypertension: Secondary | ICD-10-CM | POA: Diagnosis not present

## 2023-06-15 DIAGNOSIS — T699XXA Effect of reduced temperature, unspecified, initial encounter: Secondary | ICD-10-CM | POA: Diagnosis not present

## 2023-06-15 DIAGNOSIS — F1410 Cocaine abuse, uncomplicated: Secondary | ICD-10-CM

## 2023-06-15 DIAGNOSIS — R457 State of emotional shock and stress, unspecified: Secondary | ICD-10-CM | POA: Diagnosis not present

## 2023-06-15 DIAGNOSIS — R001 Bradycardia, unspecified: Secondary | ICD-10-CM | POA: Diagnosis not present

## 2023-06-15 LAB — COMPREHENSIVE METABOLIC PANEL WITH GFR
ALT: 17 U/L (ref 0–44)
AST: 18 U/L (ref 15–41)
Albumin: 3.9 g/dL (ref 3.5–5.0)
Alkaline Phosphatase: 72 U/L (ref 38–126)
Anion gap: 8 (ref 5–15)
BUN: 26 mg/dL — ABNORMAL HIGH (ref 8–23)
CO2: 25 mmol/L (ref 22–32)
Calcium: 8.8 mg/dL — ABNORMAL LOW (ref 8.9–10.3)
Chloride: 106 mmol/L (ref 98–111)
Creatinine, Ser: 1.15 mg/dL — ABNORMAL HIGH (ref 0.44–1.00)
GFR, Estimated: 53 mL/min — ABNORMAL LOW (ref >60)
Glucose, Bld: 110 mg/dL — ABNORMAL HIGH (ref 70–99)
Potassium: 4.2 mmol/L (ref 3.5–5.1)
Sodium: 139 mmol/L (ref 135–145)
Total Bilirubin: 0.7 mg/dL (ref 0.0–1.2)
Total Protein: 6.9 g/dL (ref 6.5–8.1)

## 2023-06-15 LAB — CBC
HCT: 46.3 % — ABNORMAL HIGH (ref 36.0–46.0)
Hemoglobin: 14.4 g/dL (ref 12.0–15.0)
MCH: 28.8 pg (ref 26.0–34.0)
MCHC: 31.1 g/dL (ref 30.0–36.0)
MCV: 92.6 fL (ref 80.0–100.0)
Platelets: 290 10*3/uL (ref 150–400)
RBC: 5 MIL/uL (ref 3.87–5.11)
RDW: 14.3 % (ref 11.5–15.5)
WBC: 7.8 10*3/uL (ref 4.0–10.5)
nRBC: 0 % (ref 0.0–0.2)

## 2023-06-15 LAB — URINE DRUG SCREEN, QUALITATIVE (ARMC ONLY)
Amphetamines, Ur Screen: NOT DETECTED
Barbiturates, Ur Screen: NOT DETECTED
Benzodiazepine, Ur Scrn: NOT DETECTED
Cannabinoid 50 Ng, Ur ~~LOC~~: NOT DETECTED
Cocaine Metabolite,Ur ~~LOC~~: POSITIVE — AB
MDMA (Ecstasy)Ur Screen: NOT DETECTED
Methadone Scn, Ur: NOT DETECTED
Opiate, Ur Screen: NOT DETECTED
Phencyclidine (PCP) Ur S: NOT DETECTED
Tricyclic, Ur Screen: NOT DETECTED

## 2023-06-15 LAB — ETHANOL: Alcohol, Ethyl (B): <15 mg/dL

## 2023-06-15 LAB — ACETAMINOPHEN LEVEL: Acetaminophen (Tylenol), Serum: <10 ug/mL — ABNORMAL LOW (ref 10–30)

## 2023-06-15 LAB — SALICYLATE LEVEL: Salicylate Lvl: <7 mg/dL — ABNORMAL LOW (ref 7.0–30.0)

## 2023-06-15 NOTE — ED Notes (Signed)
 Pt has visitor who reports to be her daughter at bedside.

## 2023-06-15 NOTE — BH Assessment (Signed)
 This writer attempted to contact [facility] to request an assessment but there was no answer. Writer left a voicemail to return phone call
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
## 2023-06-15 NOTE — ED Provider Notes (Signed)
 [facility] Provider Note    Event Date/Time   First MD Initiated Contact with Patient 06/15/23 1823     (approximate)   History   Depression   HPI  Caitlin Rivera is a 68 y.o. female who comes in for depression.    Pt has not been taking all of her medications for over a week because she just forgets to take them.  Patient requesting to see psychiatry due to endorsing SI with a plan to take all of her medications. Denies taking any today.   Patient does report that she is homeless as well and that she is hungry.  She denies any other medical concerns. Physical Exam   Triage Vital Signs: ED Triage Vitals  Encounter Vitals Group     BP 06/15/23 1627 130/80     Systolic BP Percentile --      Diastolic BP Percentile --      Pulse Rate 06/15/23 1627 81     Resp 06/15/23 1627 17     Temp 06/15/23 1627 98.6 F (37 C)     Temp Source 06/15/23 1627 Oral     SpO2 06/15/23 1627 100 %     Weight 06/15/23 1610 240 lb (108.9 kg)     Height 06/15/23 1610 5\' 6"  (1.676 m)     Head Circumference --      Peak Flow --      Pain Score 06/15/23 1608 8     Pain Loc --      Pain Education --      Exclude from Growth Chart --     Most recent vital signs: Vitals:   06/15/23 1627  BP: 130/80  Pulse: 81  Resp: 17  Temp: 98.6 F (37 C)  SpO2: 100%     General: Awake, no distress.  CV:  Good peripheral perfusion.  Resp:  Normal effort.  Abd:  No distention.  Other:  Positive SI   ED Results / Procedures / Treatments   Labs (all labs ordered are listed, but only abnormal results are displayed) Labs Reviewed  COMPREHENSIVE METABOLIC PANEL WITH GFR - Abnormal; Notable for the following components:      Result Value   Glucose, Bld 110 (*)    BUN 26 (*)    Creatinine, Ser 1.15 (*)    Calcium  8.8 (*)    GFR, Estimated 53 (*)    All other components within normal limits  CBC - Abnormal; Notable for the following components:   HCT 46.3 (*)     All other components within normal limits  URINE DRUG SCREEN, QUALITATIVE ([facility] ONLY) - Abnormal; Notable for the following components:   Cocaine Metabolite,Ur [place] POSITIVE (*)    All other components within normal limits  ETHANOL     EKG  My interpretation of EKG:    PROCEDURES:  Critical Care performed: No  Procedures   MEDICATIONS ORDERED IN ED: Medications - No data to display   IMPRESSION / MDM / ASSESSMENT AND PLAN / ED COURSE  I reviewed the triage vital signs and the nursing notes.   Patient's presentation is most consistent with acute presentation with potential threat to life or bodily function.    Pt is without any acute medical complaints. No exam findings to suggest medical cause of current presentation. Will order psychiatric screening labs and discuss further w/ psychiatric service.  D/d includes but is not limited to psychiatric disease, behavioral/personality disorder, inadequate socioeconomic support, medical.  Based on HPI, exam, unremarkable labs, no concern for acute medical problem at this time. No rigidity, clonus, hyperthermia, focal neurologic deficit, diaphoresis, tachycardia, meningismus, ataxia, gait abnormality or other finding to suggest this visit represents a non-psychiatric problem. Screening labs reviewed.    Given this, pt medically cleared, to be dispositioned per Psych.    The patient has been placed in psychiatric observation due to the need to provide a safe environment for the patient while obtaining psychiatric consultation and evaluation, as well as ongoing medical and medication management to treat the patient's condition.  The patient has not been placed under full IVC at this time.   The patient is on the cardiac monitor to evaluate for evidence of arrhythmia and/or significant heart rate changes.      FINAL CLINICAL IMPRESSION(S) / ED DIAGNOSES   Final diagnoses:  Homelessness  Suicide ideation     Rx / DC Orders    ED Discharge Orders     None        Note:  This document was prepared using Dragon voice recognition software and may include unintentional dictation errors.   Lubertha Rush, MD 06/15/23 2153

## 2023-06-15 NOTE — ED Notes (Addendum)
 Dressed out into hospital provided scrubs.  Benigno Brakeman Engineer, technical sales Gray shoes  1 ring 1 nose ring 1 toe ring 1 black purse 1 black hat Cell phone

## 2023-06-15 NOTE — ED Triage Notes (Signed)
 Pt arrived via [facility] from a local park[place]bus stop for depression. Pt states that they have been homeless for 3 years. Pt hasn't taken all of her medications for over a week, because they keep forgetting to take them. Pt states that they "need to see the psych ward" and that they are endorsing SI at this time with a plan to take all of their medications. Pt is sleepy during triage and asking for food.

## 2023-06-15 NOTE — ED Notes (Signed)
 This tech obtained vital signs on pt.

## 2023-06-15 NOTE — ED Notes (Signed)
Pt given cup of ice. 

## 2023-06-15 NOTE — ED Notes (Signed)
 Pt's daughter Mendel Stain, wishes to be called if pt is admitted anywhere.  Number in chart

## 2023-06-15 NOTE — ED Triage Notes (Signed)
 First Nurse Note: Patient to ED via [facility] from the bus stop for anxiety. PT is homeless and outside x2 days. C/o being cold. PT has not has medications in 1 week. Hx HTN, diabetes, COPD, asthma  81 HR 96% RA 155/88 120 cbg 98.5

## 2023-06-16 ENCOUNTER — Encounter: Payer: Self-pay | Admitting: Psychiatry

## 2023-06-16 DIAGNOSIS — F1590 Other stimulant use, unspecified, uncomplicated: Secondary | ICD-10-CM | POA: Diagnosis not present

## 2023-06-16 DIAGNOSIS — Z5900 Homelessness unspecified: Secondary | ICD-10-CM

## 2023-06-16 DIAGNOSIS — F39 Unspecified mood [affective] disorder: Secondary | ICD-10-CM | POA: Diagnosis not present

## 2023-06-16 DIAGNOSIS — R45851 Suicidal ideations: Secondary | ICD-10-CM | POA: Diagnosis not present

## 2023-06-16 DIAGNOSIS — F32A Depression, unspecified: Secondary | ICD-10-CM | POA: Diagnosis not present

## 2023-06-16 DIAGNOSIS — R001 Bradycardia, unspecified: Secondary | ICD-10-CM | POA: Diagnosis not present

## 2023-06-16 DIAGNOSIS — F1410 Cocaine abuse, uncomplicated: Secondary | ICD-10-CM

## 2023-06-16 LAB — CBG MONITORING, ED: Glucose-Capillary: 117 mg/dL — ABNORMAL HIGH (ref 70–99)

## 2023-06-16 MED ORDER — FLUTICASONE FUROATE-VILANTEROL 200-25 MCG/ACT IN AEPB
1.0000 | INHALATION_SPRAY | Freq: Every day | RESPIRATORY_TRACT | Status: DC
  Administered 2023-06-16: 1 via RESPIRATORY_TRACT
  Filled 2023-06-16: qty 28

## 2023-06-16 MED ORDER — ALBUTEROL SULFATE HFA 108 (90 BASE) MCG/ACT IN AERS
1.0000 | INHALATION_SPRAY | Freq: Four times a day (QID) | RESPIRATORY_TRACT | Status: DC | PRN
  Administered 2023-06-16: 2 via RESPIRATORY_TRACT
  Filled 2023-06-16: qty 6.7

## 2023-06-16 MED ORDER — DULOXETINE HCL 20 MG PO CPEP
20.0000 mg | ORAL_CAPSULE | Freq: Two times a day (BID) | ORAL | Status: DC
  Administered 2023-06-16 (×2): 20 mg via ORAL
  Filled 2023-06-16 (×2): qty 1

## 2023-06-16 MED ORDER — GABAPENTIN 300 MG PO CAPS
600.0000 mg | ORAL_CAPSULE | Freq: Two times a day (BID) | ORAL | Status: DC
  Administered 2023-06-16 (×2): 600 mg via ORAL
  Filled 2023-06-16 (×2): qty 2

## 2023-06-16 MED ORDER — HYDROXYZINE HCL 25 MG PO TABS
50.0000 mg | ORAL_TABLET | Freq: Every day | ORAL | Status: DC
  Administered 2023-06-16: 50 mg via ORAL
  Filled 2023-06-16: qty 2

## 2023-06-16 MED ORDER — DIPHENHYDRAMINE HCL 50 MG/ML IJ SOLN: 50.0000 mg | Freq: Four times a day (QID) | INTRAMUSCULAR | Status: DC | PRN

## 2023-06-16 MED ORDER — PRAZOSIN HCL 1 MG PO CAPS
1.0000 mg | ORAL_CAPSULE | Freq: Every day | ORAL | Status: DC
  Administered 2023-06-16: 1 mg via ORAL
  Filled 2023-06-16 (×2): qty 1

## 2023-06-16 MED ORDER — OLANZAPINE 10 MG IM SOLR: 5.0000 mg | Freq: Two times a day (BID) | INTRAMUSCULAR | Status: DC | PRN

## 2023-06-16 MED ORDER — OLANZAPINE 10 MG PO TBDP: 5.0000 mg | ORAL_TABLET | Freq: Two times a day (BID) | ORAL | Status: DC | PRN

## 2023-06-16 NOTE — ED Notes (Signed)
 Attempted to see patient for psychiatric evaluation; telecart was not working other telecart in use by another provider, will need to reschedule

## 2023-06-16 NOTE — ED Notes (Signed)
 Pt asked for snack at this time. Gave pt saltine crackers at this time.

## 2023-06-16 NOTE — ED Provider Notes (Signed)
 Emergency Medicine Observation Re-evaluation Note  Caitlin Rivera is a 68 y.o. female, seen on rounds today.  Pt initially presented to the ED for complaints of Depression Currently, the patient is resting, voices no medical complaints.  Physical Exam  BP (!) 146/100 (BP Location: Left Arm)   Pulse 84   Temp 98 F (36.7 C) (Oral)   Resp 17   Ht 5\' 6"  (1.676 m)   Wt 108.9 kg   SpO2 92%   BMI 38.74 kg/m  Physical Exam General: Resting in NAD Cardiac: No cyanosis Lungs: Equal rise and fall Psych: Not agitated  ED Course / MDM  EKG:   I have reviewed the labs performed to date as well as medications administered while in observation.  Recent changes in the last 24 hours include no events overnight.  Plan  Current plan is for psychiatric disposition.    Charlott Calvario J, MD 06/16/23 [phone]

## 2023-06-16 NOTE — ED Notes (Signed)
Pt provided with dinner tray at this time.

## 2023-06-16 NOTE — Progress Notes (Signed)
   06/16/23 1430  Spiritual Encounters  Type of Visit Initial  Care provided to: Patient  Conversation partners present during encounter Nurse  Reason for visit Routine spiritual support  OnCall Visit No   Chaplain noticed patient seemed uncomfortable on her bed and checked in.  Patient said she just switches sides and is OK.  Patient asked for crackers and water and Chaplain checked with Nurse to see if she could have those.  When approved, Chaplain got the items for the patient.  Chaplain then offered patient a compassionate presence.  Patient shared that she wants to end her life because she's dealing with a lot and is unhoused.  Chaplain shared the thoughts with the Nurse who was already aware.  Chaplain let patient know that [employer] is available if she has any other needs.    Rev. Rana M. Nolon Baxter, M.Div. Chaplain Resident [facility]

## 2023-06-16 NOTE — ED Notes (Signed)
 Pt reporting to ED d/t depression. Pt was found [place] bus stop, report noncompliance with medications for about 1 week. Pt was endorsing SI initially, but not at this time. Per report, pt had a good day, was calm and cooperative. Pt Pt ABCs intact. RR even and unlabored. Pt in NAD. Denies needs at this time.   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

## 2023-06-16 NOTE — ED Notes (Signed)
 VOL CONSULT  DONE  PENDING  PLACEMENT
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
## 2023-06-16 NOTE — ED Notes (Signed)
Vol /psych consult pending 

## 2023-06-17 ENCOUNTER — Other Ambulatory Visit: Payer: Self-pay

## 2023-06-17 ENCOUNTER — Inpatient Hospital Stay
Admission: AD | Admit: 2023-06-17 | Discharge: 2023-06-30 | DRG: 885 | Disposition: A | Discharge status: Home / Self Care | Source: Intra-hospital | Attending: Psychiatry | Admitting: Psychiatry

## 2023-06-17 ENCOUNTER — Encounter: Payer: Self-pay | Admitting: Psychiatry

## 2023-06-17 DIAGNOSIS — F17210 Nicotine dependence, cigarettes, uncomplicated: Secondary | ICD-10-CM | POA: Diagnosis present

## 2023-06-17 DIAGNOSIS — E669 Obesity, unspecified: Secondary | ICD-10-CM | POA: Diagnosis present

## 2023-06-17 DIAGNOSIS — E119 Type 2 diabetes mellitus without complications: Secondary | ICD-10-CM | POA: Diagnosis present

## 2023-06-17 DIAGNOSIS — Z886 Allergy status to analgesic agent status: Secondary | ICD-10-CM

## 2023-06-17 DIAGNOSIS — F1410 Cocaine abuse, uncomplicated: Secondary | ICD-10-CM | POA: Diagnosis present

## 2023-06-17 DIAGNOSIS — G4700 Insomnia, unspecified: Secondary | ICD-10-CM | POA: Diagnosis present

## 2023-06-17 DIAGNOSIS — Z885 Allergy status to narcotic agent status: Secondary | ICD-10-CM

## 2023-06-17 DIAGNOSIS — E7800 Pure hypercholesterolemia, unspecified: Secondary | ICD-10-CM | POA: Diagnosis present

## 2023-06-17 DIAGNOSIS — Z923 Personal history of irradiation: Secondary | ICD-10-CM | POA: Diagnosis not present

## 2023-06-17 DIAGNOSIS — Z91148 Patient's other noncompliance with medication regimen for other reason: Secondary | ICD-10-CM

## 2023-06-17 DIAGNOSIS — Z5900 Homelessness unspecified: Secondary | ICD-10-CM | POA: Diagnosis not present

## 2023-06-17 DIAGNOSIS — J4489 Other specified chronic obstructive pulmonary disease: Secondary | ICD-10-CM | POA: Diagnosis present

## 2023-06-17 DIAGNOSIS — F333 Major depressive disorder, recurrent, severe with psychotic symptoms: Secondary | ICD-10-CM | POA: Diagnosis not present

## 2023-06-17 DIAGNOSIS — F419 Anxiety disorder, unspecified: Secondary | ICD-10-CM | POA: Diagnosis present

## 2023-06-17 DIAGNOSIS — Z809 Family history of malignant neoplasm, unspecified: Secondary | ICD-10-CM

## 2023-06-17 DIAGNOSIS — Z7984 Long term (current) use of oral hypoglycemic drugs: Secondary | ICD-10-CM | POA: Diagnosis not present

## 2023-06-17 DIAGNOSIS — Z86000 Personal history of in-situ neoplasm of breast: Secondary | ICD-10-CM | POA: Diagnosis not present

## 2023-06-17 DIAGNOSIS — Z79899 Other long term (current) drug therapy: Secondary | ICD-10-CM | POA: Diagnosis not present

## 2023-06-17 DIAGNOSIS — F332 Major depressive disorder, recurrent severe without psychotic features: Principal | ICD-10-CM | POA: Diagnosis present

## 2023-06-17 DIAGNOSIS — I10 Essential (primary) hypertension: Secondary | ICD-10-CM | POA: Diagnosis present

## 2023-06-17 DIAGNOSIS — Z6838 Body mass index (BMI) 38.0-38.9, adult: Secondary | ICD-10-CM | POA: Diagnosis not present

## 2023-06-17 DIAGNOSIS — R45851 Suicidal ideations: Secondary | ICD-10-CM | POA: Diagnosis present

## 2023-06-17 DIAGNOSIS — Z5982 Transportation insecurity: Secondary | ICD-10-CM | POA: Diagnosis not present

## 2023-06-17 DIAGNOSIS — Z7951 Long term (current) use of inhaled steroids: Secondary | ICD-10-CM

## 2023-06-17 MED ORDER — AMLODIPINE BESYLATE 5 MG PO TABS
5.0000 mg | ORAL_TABLET | Freq: Every day | ORAL | Status: DC
  Administered 2023-06-17 – 2023-06-30 (×13): 5 mg via ORAL
  Filled 2023-06-17 (×14): qty 1

## 2023-06-17 MED ORDER — MAGNESIUM HYDROXIDE 400 MG/5ML PO SUSP: 30.0000 mL | Freq: Every day | ORAL | Status: DC | PRN

## 2023-06-17 MED ORDER — ROSUVASTATIN CALCIUM 20 MG PO TABS
20.0000 mg | ORAL_TABLET | Freq: Every day | ORAL | Status: DC
  Administered 2023-06-17 – 2023-06-30 (×14): 20 mg via ORAL
  Filled 2023-06-17 (×14): qty 1

## 2023-06-17 MED ORDER — ACETAMINOPHEN 325 MG PO TABS
650.0000 mg | ORAL_TABLET | Freq: Four times a day (QID) | ORAL | Status: DC | PRN
  Administered 2023-06-22 – 2023-06-26 (×5): 650 mg via ORAL
  Filled 2023-06-17 (×5): qty 2

## 2023-06-17 MED ORDER — HALOPERIDOL 5 MG PO TABS: 5.0000 mg | ORAL_TABLET | Freq: Three times a day (TID) | ORAL | Status: DC | PRN

## 2023-06-17 MED ORDER — DULOXETINE HCL 20 MG PO CPEP
20.0000 mg | ORAL_CAPSULE | Freq: Two times a day (BID) | ORAL | Status: DC
  Administered 2023-06-17 – 2023-06-23 (×14): 20 mg via ORAL
  Filled 2023-06-17 (×15): qty 1

## 2023-06-17 MED ORDER — ALUM & MAG HYDROXIDE-SIMETH 200-200-20 MG/5ML PO SUSP: 30.0000 mL | ORAL | Status: DC | PRN

## 2023-06-17 MED ORDER — TIZANIDINE HCL 4 MG PO TABS
4.0000 mg | ORAL_TABLET | Freq: Three times a day (TID) | ORAL | Status: DC | PRN
  Administered 2023-06-17 – 2023-06-29 (×25): 4 mg via ORAL
  Filled 2023-06-17 (×28): qty 1

## 2023-06-17 MED ORDER — HYDROXYZINE HCL 50 MG PO TABS
50.0000 mg | ORAL_TABLET | Freq: Every day | ORAL | Status: DC
  Administered 2023-06-17 – 2023-06-23 (×7): 50 mg via ORAL
  Filled 2023-06-17 (×8): qty 1

## 2023-06-17 MED ORDER — DIPHENHYDRAMINE HCL 50 MG/ML IJ SOLN: 50.0000 mg | Freq: Three times a day (TID) | INTRAMUSCULAR | Status: DC | PRN

## 2023-06-17 MED ORDER — TRAZODONE HCL 50 MG PO TABS
50.0000 mg | ORAL_TABLET | Freq: Every evening | ORAL | Status: DC | PRN
  Administered 2023-06-18 – 2023-06-29 (×8): 50 mg via ORAL
  Filled 2023-06-17 (×8): qty 1

## 2023-06-17 MED ORDER — FLUTICASONE FUROATE-VILANTEROL 200-25 MCG/ACT IN AEPB
1.0000 | INHALATION_SPRAY | Freq: Every day | RESPIRATORY_TRACT | Status: DC
  Administered 2023-06-17 – 2023-06-25 (×8): 1 via RESPIRATORY_TRACT
  Filled 2023-06-17: qty 28

## 2023-06-17 MED ORDER — LORAZEPAM 2 MG/ML IJ SOLN: 2.0000 mg | Freq: Three times a day (TID) | INTRAMUSCULAR | Status: DC | PRN

## 2023-06-17 MED ORDER — HALOPERIDOL LACTATE 5 MG/ML IJ SOLN: 5.0000 mg | Freq: Three times a day (TID) | INTRAMUSCULAR | Status: DC | PRN

## 2023-06-17 MED ORDER — METFORMIN HCL 500 MG PO TABS
500.0000 mg | ORAL_TABLET | Freq: Every day | ORAL | Status: DC
  Administered 2023-06-17 – 2023-06-30 (×14): 500 mg via ORAL
  Filled 2023-06-17 (×14): qty 1

## 2023-06-17 MED ORDER — ALBUTEROL SULFATE HFA 108 (90 BASE) MCG/ACT IN AERS
1.0000 | INHALATION_SPRAY | Freq: Four times a day (QID) | RESPIRATORY_TRACT | Status: DC | PRN
  Administered 2023-06-17 – 2023-06-28 (×9): 2 via RESPIRATORY_TRACT

## 2023-06-17 MED ORDER — GABAPENTIN 300 MG PO CAPS
600.0000 mg | ORAL_CAPSULE | Freq: Two times a day (BID) | ORAL | Status: DC
  Administered 2023-06-17 – 2023-06-27 (×21): 600 mg via ORAL
  Filled 2023-06-17 (×21): qty 2

## 2023-06-17 MED ORDER — PRAZOSIN HCL 1 MG PO CAPS
1.0000 mg | ORAL_CAPSULE | Freq: Every day | ORAL | Status: DC
  Administered 2023-06-17 – 2023-06-29 (×13): 1 mg via ORAL
  Filled 2023-06-17 (×14): qty 1

## 2023-06-17 MED ORDER — DIPHENHYDRAMINE HCL 25 MG PO CAPS: 50.0000 mg | ORAL_CAPSULE | Freq: Three times a day (TID) | ORAL | Status: DC | PRN

## 2023-06-17 NOTE — Group Note (Signed)
 Date:  06/17/2023 Time:  1:22 PM  Group Topic/Focus:  Coping With Mental Health Crisis:   The purpose of this group is to help patients identify strategies for coping with mental health crisis.  Group discusses possible causes of crisis and ways to manage them effectively. Healthy Communication:   The focus of this group is to discuss communication, barriers to communication, as well as healthy ways to communicate with others.    Participation Level:  Active  Participation Quality:  Appropriate  Affect:  Appropriate  Cognitive:  Appropriate  Insight: Appropriate  Engagement in Group:  Engaged  Modes of Intervention:  Discussion  Additional Comments:    Caitlin Rivera Caitlin Rivera 06/17/2023, 1:22 PM

## 2023-06-17 NOTE — Group Note (Signed)
 Date:  06/17/2023 Time:  8:45 PM  Group Topic/Focus:  Wrap-Up Group:   The focus of this group is to help patients review their daily goal of treatment and discuss progress on daily workbooks.    Participation Level:  Active  Participation Quality:  Appropriate and Attentive  Affect:  Appropriate  Cognitive:  Alert and Appropriate  Insight: Appropriate, Good, and Improving  Engagement in Group:  Developing/Improving and Engaged  Modes of Intervention:  Discussion, Rapport Building, Socialization, and Support  Additional Comments:     Janda Cargo 06/17/2023, 8:45 PM

## 2023-06-17 NOTE — H&P (Signed)
 Psychiatric Admission Assessment Adult  Patient Identification: Caitlin Rivera MRN:  [id number] Date of Evaluation:  06/17/2023 Chief Complaint:  MDD (major depressive disorder), recurrent episode, severe (HCC) [F33.2]   History of Present Illness: 68 year old female presented voluntarily to the ED for evaluation of depression and suicidal ideation. They are admitted to behavioral health. There is a known history of depression, DM, morbid obesity, hypercholesterolemia, hypertension, COPD, homelessness, and medication noncompliance.  Patient reports intent to overdose on her medications, though indicated that she had been not been compliant with her medications recently for over a week, citing forgetfulness and stating that the medications "don't work." She also reports being homeless for the past 2 years and feeling unsupported and hungry. During the ED and psychiatric consult, patient expressed feelings of hopelessness, helplessness, and frustration, stating "I'll just go get run over by a train."  On exam today she continues to endorse feelings of hopelessness, helplessness, anhedonia, decreased energy, and decreased concentration.  She notes feelings of hopelessness and rates depression as 10 out of 10.  She also notes feelings of guilt and worry rated anxiety at 10 out of 10 as well.  She reported auditory hallucinations described as "we be talking, having conversations," on initial presentation but denies to the day she noted she had used cocaine prior to arrival.  She does note that she will periodically talk to dead level and but again is not exhibiting signs of psychosis today.  She does not appear internally preoccupied she is linear on exam.  No overt signs of mania or psychosis.  Denies command hallucinations or visual hallucinations. She endorsed passive SI, with inconsistent reporting across providers of plan. No current or recent episodes of mania, grandiosity, or impulsive behaviors were  noted. She denies self-injurious behaviors at this time.  Patient also reports decreased sleep (3-4 hours/night), poor appetite, low motivation, low energy, and impaired concentration. She acknowledges seeing a therapist in the past but has not maintained follow-up.  On further questioning about the sleep she notes she slept well last night discussed that poor sleep may be in the context of her ongoing substance use she is agreeable with this.  Discussed given medication noncompliance substance use is appropriate to restart home meds.  Medications are reviewed and are notably the same as when she was discharged from this facility on 02/16/2023 when questioned about how she followed up.  She denied indicates she had a psychiatric provider reports she has a PCP that she is scheduled to see at the end of the month of note on the discharge summary from most recent psychiatric admission she declined follow-up appointment.  Is unclear the timeline patient has been off medications there is concern that it has been longer than the stated time.  Patient was admitted to adult behavioral health unit however she notes that she had a fall yesterday prior to admission.  She has a walker on the unit staff is notified patient is placed on fall precautions patient is likely more appropriate for geriatric unit.  Total Time spent with patient: 1 hour Sleep  Sleep:Sleep: Fair  Past Psychiatric History: Patient and chart review Psychiatric History:  Information collected from patient   Prev Dx/Sx: Depression Current Psych Provider: None reported Home Meds (current): Unable to recall Previous Med Trials: Unable to recall Therapy: None reported   Prior Psych Hospitalization: 2/25 [facility] Prior Self Harm: None reported Prior Violence: None reported   Family Psych History: Multiple family members with mental health history and substance  use history both maternal and paternal side Family Hx suicide: None reported   Social  History:  Developmental Hx: Normal development Educational Hx: 10th grade Occupational Hx: Currently not working gets SSI Legal Hx: None reported Living Situation: Reports to be homeless and is not in communication with any of her family members including her children but reports having 3 children Spiritual Hx: None reported Access to weapons/lethal means: None reported   Substance History Alcohol: Consistently denies Type of alcohol none reported Last Drink none reported Number of drinks per day none reported History of alcohol withdrawal seizures none reported History of DT's none reported Tobacco: 1 pack/day for many years Illicit drugs: Use of crack for many years, last use was prior to arrival is unable to note quantity or frequency, told one provider every other weekend Prescription drug abuse: None reported Rehab hx: None reported Is the patient at risk to self? Yes.    Has the patient been a risk to self in the past 6 months? Yes.    Has the patient been a risk to self within the distant past? No.  Is the patient a risk to others? No.  Has the patient been a risk to others in the past 6 months? No.  Has the patient been a risk to others within the distant past? No.   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   COPD (chronic obstructive pulmonary disease) (HCC)    smoker   Diabetes mellitus without complication (HCC)    Fibroids    Hypertension    Personal history of radiation therapy    Smoker     Past Surgical History:  Procedure Laterality Date   BREAST BIOPSY Bilateral 06/03/2019    BREAST BIOPSY Left 06/14/2019   BREAST EXCISIONAL BIOPSY Right 07/2019   BREAST LUMPECTOMY Left 07/2019   BREAST LUMPECTOMY WITH RADIOACTIVE SEED LOCALIZATION Bilateral 07/31/2019   Procedure: BILATERAL BREAST LUMPECTOMY WITH RADIOACTIVE SEED LOCALIZATION;  Surgeon: Caralyn Chandler, MD;  Location: [facility];  Service: General;  Laterality: Bilateral;   TUBAL LIGATION     TUBAL LIGATION     Family History:  Family History  Problem Relation Age of Onset   Cancer Mother     Social History:  Social History   Substance and Sexual Activity  Alcohol Use No     Social History   Substance and Sexual Activity  Drug Use Yes   Types: "Crack" cocaine   Comment: crack-last smoked 2 wks ago-07-24-19, states she uses once a month      Allergies:   Allergies  Allergen Reactions   Ibuprofen Itching   Morphine  And Codeine Itching   Lab Results:  Results for orders placed or performed during the hospital encounter of 06/15/23 (from the past 48 hours)  Comprehensive metabolic panel     Status: Abnormal   Collection Time: 06/15/23  4:17 PM  Result Value Ref Range   Sodium 139 135 - 145 mmol/L   Potassium 4.2 3.5 - 5.1 mmol/L   Chloride 106 98 - 111 mmol/L   CO2 25 22 -  32 mmol/L   Glucose, Bld 110 (H) 70 - 99 mg/dL    Comment: Glucose reference range applies only to samples taken after fasting for at least 8 hours.   BUN 26 (H) 8 - 23 mg/dL   Creatinine, Ser 1.60 (H) 0.44 - 1.00 mg/dL   Calcium  8.8 (L) 8.9 - 10.3 mg/dL   Total Protein 6.9 6.5 - 8.1 g/dL   Albumin 3.9 3.5 - 5.0 g/dL   AST 18 15 - 41 U/L   ALT 17 0 - 44 U/L   Alkaline Phosphatase 72 38 - 126 U/L   Total Bilirubin 0.7 0.0 - 1.2 mg/dL   GFR, Estimated 53 (L) >60 mL/min    Comment: (NOTE) Calculated using the CKD-EPI Creatinine Equation (2021)    Anion gap 8 5 - 15    Comment: Performed at [facility], [address] [place], [place] [address]  Ethanol     Status: None   Collection Time:  06/15/23  4:17 PM  Result Value Ref Range   Alcohol, Ethyl (B) <15 <15 mg/dL    Comment: (NOTE) For medical purposes only. Performed at [facility], [address] [place], [place] [address]   cbc     Status: Abnormal   Collection Time: 06/15/23  4:17 PM  Result Value Ref Range   WBC 7.8 4.0 - 10.5 K/uL   RBC 5.00 3.87 - 5.11 MIL/uL   Hemoglobin 14.4 12.0 - 15.0 g/dL   HCT 22.0 (H) 25.4 - 27.0 %   MCV 92.6 80.0 - 100.0 fL   MCH 28.8 26.0 - 34.0 pg   MCHC 31.1 30.0 - 36.0 g/dL   RDW 62.3 76.2 - 83.1 %   Platelets 290 150 - 400 K/uL   nRBC 0.0 0.0 - 0.2 %    Comment: Performed at [facility], [address], [place], [place] [address]  Urine Drug Screen, Qualitative     Status: Abnormal   Collection Time: 06/15/23  4:17 PM  Result Value Ref Range   Tricyclic, Ur Screen NONE DETECTED NONE DETECTED   Amphetamines, Ur Screen NONE DETECTED NONE DETECTED   MDMA (Ecstasy)Ur Screen NONE DETECTED NONE DETECTED   Cocaine Metabolite,Ur [place] POSITIVE (A) NONE DETECTED   Opiate, Ur Screen NONE DETECTED NONE DETECTED   Phencyclidine (PCP) Ur S NONE DETECTED NONE DETECTED   Cannabinoid 50 Ng, Ur [place] NONE DETECTED NONE DETECTED   Barbiturates, Ur Screen NONE DETECTED NONE DETECTED   Benzodiazepine, Ur Scrn NONE DETECTED NONE DETECTED   Methadone Scn, Ur NONE DETECTED NONE DETECTED    Comment: (NOTE) Tricyclics + metabolites, urine    Cutoff 1000 ng/mL Amphetamines + metabolites, urine  Cutoff 1000 ng/mL MDMA (Ecstasy), urine              Cutoff 500 ng/mL Cocaine Metabolite, urine          Cutoff 300 ng/mL Opiate + metabolites, urine        Cutoff 300 ng/mL Phencyclidine (PCP), urine         Cutoff 25 ng/mL Cannabinoid, urine                 Cutoff 50 ng/mL Barbiturates + metabolites, urine  Cutoff 200 ng/mL Benzodiazepine, urine              Cutoff 200 ng/mL Methadone, urine                   Cutoff 300 ng/mL  The urine  drug screen provides only a preliminary,  unconfirmed analytical test result and should not be used for non-medical purposes. Clinical consideration and professional judgment should be applied to any positive drug screen result due to possible interfering substances. A more specific alternate chemical method must be used in order to obtain a confirmed analytical result. Gas chromatography / mass spectrometry (GC/MS) is the preferred confirm atory method. Performed at [facility], [address] [place], [place] [address]   Salicylate level     Status: Abnormal   Collection Time: 06/15/23  4:17 PM  Result Value Ref Range   Salicylate Lvl <7.0 (L) 7.0 - 30.0 mg/dL    Comment: Performed at [facility], [address] [place], [place] [address]  Acetaminophen  level     Status: Abnormal   Collection Time: 06/15/23  4:17 PM  Result Value Ref Range   Acetaminophen  (Tylenol ), Serum <10 (L) 10 - 30 ug/mL    Comment: (NOTE) Therapeutic concentrations vary significantly. A range of 10-30 ug/mL  may be an effective concentration for many patients. However, some  are best treated at concentrations outside of this range. Acetaminophen  concentrations >150 ug/mL at 4 hours after ingestion  and >50 ug/mL at 12 hours after ingestion are often associated with  toxic reactions.  Performed at [facility], [address] [place], [place] [address]   CBG monitoring, ED     Status: Abnormal   Collection Time: 06/16/23  8:45 AM  Result Value Ref Range   Glucose-Capillary 117 (H) 70 - 99 mg/dL    Comment: Glucose reference range applies only to samples taken after fasting for at least 8 hours.    Blood Alcohol level:  Lab Results  Component Value Date   [facility] <15 06/15/2023   ETH <10 02/10/2023    Metabolic Disorder Labs:  Lab Results  Component Value Date   HGBA1C 6.6 (H) 02/03/2023   MPG 142.72 02/03/2023   No results found for: "PROLACTIN" No results found for: "CHOL", "TRIG", "HDL", "CHOLHDL",  "VLDL", "LDLCALC"  Current Medications: Current Facility-Administered Medications  Medication Dose Route Frequency Provider Last Rate Last Admin   acetaminophen  (TYLENOL ) tablet 650 mg  650 mg Oral Q6H PRN Bobbitt, Shalon E, NP       albuterol  (VENTOLIN  HFA) 108 (90 Base) MCG/ACT inhaler 1-2 puff  1-2 puff Inhalation Q6H PRN Bobbitt, Shalon E, NP   2 puff at 06/17/23 0842   alum & mag hydroxide-simeth (MAALOX/MYLANTA) 200-200-20 MG/5ML suspension 30 mL  30 mL Oral Q4H PRN Bobbitt, Shalon E, NP       amLODipine  (NORVASC ) tablet 5 mg  5 mg Oral Daily Bobbitt, Shalon E, NP   5 mg at 06/17/23 0817   haloperidol (HALDOL) tablet 5 mg  5 mg Oral TID PRN Bobbitt, Shalon E, NP       And   diphenhydrAMINE  (BENADRYL ) capsule 50 mg  50 mg Oral TID PRN Bobbitt, Shalon E, NP       haloperidol lactate (HALDOL) injection 5 mg  5 mg Intramuscular TID PRN Bobbitt, Shalon E, NP       And   diphenhydrAMINE  (BENADRYL ) injection 50 mg  50 mg Intramuscular TID PRN Bobbitt, Shalon E, NP       And   LORazepam (ATIVAN) injection 2 mg  2 mg Intramuscular TID PRN Bobbitt, Shalon E, NP       DULoxetine  (CYMBALTA ) DR capsule 20 mg  20 mg Oral BID Bobbitt, Shalon E, NP  20 mg at 06/17/23 0816   fluticasone  furoate-vilanterol (BREO ELLIPTA ) 200-25 MCG/ACT 1 puff  1 puff Inhalation Daily Bobbitt, Shalon E, NP   1 puff at 06/17/23 4098   gabapentin  (NEURONTIN ) capsule 600 mg  600 mg Oral BID Bobbitt, Shalon E, NP   600 mg at 06/17/23 1191   hydrOXYzine  (ATARAX ) tablet 50 mg  50 mg Oral QHS Bobbitt, Shalon E, NP       magnesium  hydroxide (MILK OF MAGNESIA) suspension 30 mL  30 mL Oral Daily PRN Bobbitt, Shalon E, NP       metFORMIN  (GLUCOPHAGE ) tablet 500 mg  500 mg Oral Q breakfast Bobbitt, Shalon E, NP   500 mg at 06/17/23 4782   prazosin  (MINIPRESS ) capsule 1 mg  1 mg Oral QHS Bobbitt, Shalon E, NP       rosuvastatin  (CRESTOR ) tablet 20 mg  20 mg Oral Daily Bobbitt, Shalon E, NP   20 mg at 06/17/23 0817   tiZANidine   (ZANAFLEX ) tablet 4 mg  4 mg Oral Q8H PRN Bobbitt, Shalon E, NP   4 mg at 06/17/23 0846   traZODone (DESYREL) tablet 50 mg  50 mg Oral QHS PRN Bobbitt, Shalon E, NP       PTA Medications: Medications Prior to Admission  Medication Sig Dispense Refill Last Dose/Taking   albuterol  (VENTOLIN  HFA) 108 (90 Base) MCG/ACT inhaler Inhale 1-2 puffs into the lungs every 6 (six) hours as needed for wheezing or shortness of breath. 6.7 g 0    amLODipine  (NORVASC ) 5 MG tablet Take 1 tablet (5 mg total) by mouth daily. 30 tablet 0    DULoxetine  (CYMBALTA ) 20 MG capsule Take 1 capsule (20 mg total) by mouth 2 (two) times daily. 60 capsule 0    gabapentin  (NEURONTIN ) 300 MG capsule Take 2 capsules (600 mg total) by mouth 2 (two) times daily. 120 capsule 0    hydrOXYzine  (ATARAX ) 50 MG tablet Take 1 tablet (50 mg total) by mouth at bedtime. 30 tablet 0    metFORMIN  (GLUCOPHAGE ) 500 MG tablet Take 1 tablet (500 mg total) by mouth daily with breakfast. 30 tablet 0    prazosin  (MINIPRESS ) 1 MG capsule Take 1 capsule (1 mg total) by mouth at bedtime. 30 capsule 0    rosuvastatin  (CRESTOR ) 20 MG tablet Take 1 tablet (20 mg total) by mouth daily. 30 tablet 0    SYMBICORT  160-4.5 MCG/ACT inhaler Inhale 2 puffs into the lungs in the morning and at bedtime. 6 g 0    tiZANidine  (ZANAFLEX ) 4 MG tablet Take 1 tablet (4 mg total) by mouth every 8 (eight) hours as needed for muscle spasms. 30 tablet 0     Psychiatric Specialty Exam:  Presentation  General Appearance:  Casual  Eye Contact: Fair  Speech: Clear and Coherent  Speech Volume: Normal    Mood and Affect  Mood: Dysphoric  Affect: Congruent   Thought Process  Thought Processes: Linear  Descriptions of Associations:Intact  Orientation:Full (Time, Place and Person)  Thought Content:Rumination  Hallucinations:Hallucinations: None  Ideas of Reference:None  Suicidal Thoughts:Suicidal Thoughts: No  Homicidal Thoughts:Homicidal Thoughts:  No   Sensorium  Memory: Immediate Poor  Judgment: Poor  Insight: Poor   Executive Functions  Concentration: Poor  Attention Span: Fair  Recall: Fair  Fund of Knowledge: Fair  Language: Fair   Psychomotor Activity  Psychomotor Activity: Psychomotor Activity: Normal   Assets  Assets: Communication Skills; Desire for Improvement    Musculoskeletal: Strength & Muscle Tone: decreased Gait &  Station: unsteady  Physical Exam: Physical Exam Vitals and nursing note reviewed.  Constitutional:      Appearance: She is obese.  HENT:     Head: Atraumatic.  Eyes:     Extraocular Movements: Extraocular movements intact.  Pulmonary:     Effort: Pulmonary effort is normal.  Neurological:     Mental Status: She is alert and oriented to person, place, and time.    Review of Systems  Psychiatric/Behavioral:  Positive for depression, substance abuse and suicidal ideas. Negative for hallucinations. The patient is not nervous/anxious.    Blood pressure 111/65, pulse 73, temperature (!) 97.2 F (36.2 C), temperature source Oral, resp. rate 17, height 5\' 6"  (1.676 m), weight 107.5 kg, SpO2 93%. Body mass index is 38.25 kg/m.  Principal Diagnosis: MDD (major depressive disorder), recurrent episode, severe (HCC) Diagnosis:  Principal Problem:   MDD (major depressive disorder), recurrent episode, severe (HCC) Active Problems:   Cocaine abuse, episodic (HCC)   Clinical Decision Making:  This is a 68 year old female with a known history of Major Depressive Disorder, recurrent, and Stimulant Use Disorder (crack/cocaine), presenting voluntarily to the ED for evaluation of worsening depressive symptoms and suicidal ideation.  There is also concern for PTSD. the patient reports a recent plan to overdose on medications, though she denies having taken any at the time of presentation. Her mental status exam and collateral suggest ongoing depressive symptoms including anhedonia,  amotivation, hopelessness, decreased sleep, poor appetite, and impaired concentration.  She reports significant psychosocial stressors including chronic homelessness, lack of family support, limited access to resources, and food insecurity. Medication adherence is poor, reportedly due to forgetfulness and perceived ineffectiveness. Although she endorses auditory hallucinations, these are non-command and non-distressing in nature, and there is no evidence of disorganized thought process, delusions, or mania. Her current presentation does not meet criteria for psychotic disorder or mood disorder with psychotic features. Passive suicidal ideation is inconsistently reported, with concerning statements such as "I'll go get run over by a train."  Given her psychiatric history, recent suicidal ideation with plan, poor insight into her illness, noncompliance with medication, and unstable living conditions, the patient requires continued inpatient psychiatric hospitalization for safety, stabilization, and treatment planning.    Treatment Plan Summary:  Safety and Monitoring:             -- Voluntary admission to inpatient psychiatric unit for safety, stabilization and treatment             -- Daily contact with patient to assess and evaluate symptoms and progress in treatment             -- Patient's case to be discussed in multi-disciplinary team meeting             -- Observation Level: q15 minute checks             -- Vital signs:  q12 hours             -- Precautions: suicide, elopement, and assault   2. Psychiatric Diagnoses and Treatment:                 MDD:  - Restart Cymbalta  20 mg twice daily  PTSD rule out  - Prazosin  1 mg nightly  Anxiety/insomnia  - Hydroxyzine  50 mg at bedtime  - Patient is also notably on gabapentin  600 mg twice daily     -- The risks/benefits/side-effects/alternatives to this medication were discussed in detail with the patient and time was given for  questions.  The patient consents to medication trial.                -- Metabolic profile and EKG monitoring obtained while on an atypical antipsychotic (BMI: Lipid Panel: HbgA1c: QTc:)              -- Encouraged patient to participate in unit milieu and in scheduled group therapies                            3. Medical Issues Being Addressed:    Restart home meds amlodipine  5 mg daily, Breo Ellipta  substituted for Symbicort , metformin  500 mg daily, rosuvastatin  20 mg daily, gabapentin  600 mg twice daily.  Consult to diabetes coordinator is placed.   4. Discharge Planning:              -- Social work and case management to assist with discharge planning and identification of hospital follow-up needs prior to discharge             -- Estimated LOS: 5-7 days             -- Discharge Concerns: Need to establish a safety plan; Medication compliance and effectiveness             -- Discharge Goals: Return home with outpatient referrals follow ups  Physician Treatment Plan for Primary Diagnosis: MDD (major depressive disorder), recurrent episode, severe (HCC) Long Term Goal(s): Improvement in symptoms so as ready for discharge  Short Term Goals: Ability to identify and develop effective coping behaviors will improve, Ability to maintain clinical measurements within normal limits will improve, Compliance with prescribed medications will improve, and Ability to identify triggers associated with substance abuse/mental health issues will improve  Physician Treatment Plan for Secondary Diagnosis: Principal Problem:   MDD (major depressive disorder), recurrent episode, severe (HCC) Active Problems:   Cocaine abuse, episodic (HCC)  Long Term Goal(s): Improvement in symptoms so as ready for discharge  Short Term Goals: Ability to identify and develop effective coping behaviors will improve, Ability to maintain clinical measurements within normal limits will improve, Compliance with prescribed medications will  improve, and Ability to identify triggers associated with substance abuse/mental health issues will improve  I certify that inpatient services furnished can reasonably be expected to improve the patient's condition.    Fay Hoop, PA-C 6/7/20252:00 PM

## 2023-06-17 NOTE — Plan of Care (Signed)

## 2023-06-17 NOTE — Progress Notes (Signed)
 Admission Note:  68 yr female who presents  voluntary for acute distress for the treatment of SI and Depression. Pt appears flat and depressed, she was calm and cooperative with admission process. Patient presents with passive SI and contracts for safety upon admission. Pt expressed she was experiencing worsening depression and stated she was relapsing on cocaine. Patient has past medical Hx of MDD, suicidal ideation, and HTN . Patient is currently homeless. Per assessment patient is alert and oriented x 4, no distress noted,  affect is blunted, speech is clear non pressured, thoughts are linear, mood is congruent with affect,, her gait is unsteady she reported she used a cane at home. Skin was assessed and found to be clear of any abnormal marks, skin warm and dry. Patient was searched and no contraband found, POC and unit policies explained and understanding verbalized. Patient was oriented on the unit and  snacks and fluids offered, and patient receptive.  Patient had no additional questions or concerns,15 minutes safety checks maintained will continue to monitor.
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
## 2023-06-17 NOTE — Tx Team (Signed)
 Initial Treatment Plan 06/17/2023 3:47 AM Caitlin Rivera Caitlin Rivera Caitlin Rivera GLO:[id number]    PATIENT STRESSORS: Health problems   Medication change or noncompliance   Substance abuse     PATIENT STRENGTHS: Ability for insight  Communication skills  Motivation for treatment/growth    PATIENT IDENTIFIED PROBLEMS:  Major Depressive disorder   Substance Abuse                    DISCHARGE CRITERIA:  Adequate post-discharge living arrangements Improved stabilization in mood, thinking, and/or behavior Medical problems require only outpatient monitoring Motivation to continue treatment in a less acute level of care  PRELIMINARY DISCHARGE PLAN: Outpatient therapy  PATIENT/FAMILY INVOLVEMENT: This treatment plan has been presented to and reviewed with the patient, Caitlin Rivera,The patient and family have been given the opportunity to ask questions and make suggestions.  Caitlin Rivera Caitlin Taris Galindo, RN 06/17/2023, 3:47 AM

## 2023-06-17 NOTE — Group Note (Signed)
 Date:  06/17/2023 Time:  12:57 PM  Group Topic/Focus:  Healthy Communication:   The focus of this group is to discuss communication, barriers to communication, as well as healthy ways to communicate with others. Personal Choices and Values:   The focus of this group is to help patients assess and explore the importance of values in their lives, how their values affect their decisions, how they express their values and what opposes their expression.    Participation Level:  Did Not Attend  Participation Quality:    Affect:    Cognitive:    Insight:   Engagement in Group:    Modes of Intervention:    Additional Comments:    Sangita Zani L Bentley Fissel 06/17/2023, 12:57 PM

## 2023-06-17 NOTE — Progress Notes (Signed)
   06/17/23 0817  Psych Admission Type (Psych Patients Only)  Admission Status Voluntary  Psychosocial Assessment  Patient Complaints Irritability  Eye Contact Avoids  Facial Expression Flat;Sad;Worried  Affect Apprehensive;Blunted  Speech Pressured  Interaction Assertive  Motor Activity Shuffling (walker)  Appearance/Hygiene Disheveled;In scrubs  Behavior Characteristics Appropriate to situation  Mood Anxious;Pleasant  Aggressive Behavior  Effect No apparent injury  Thought Process  Coherency WDL  Content WDL  Delusions WDL  Perception WDL  Hallucination Auditory (She stated that she talks to dead loved ones; positive conotation)  Judgment WDL  Confusion WDL  Danger to Self  Current suicidal ideation? Passive  Description of Suicide Plan  (has a plan she doesnt want to devulge)  Self-Injurious Behavior No self-injurious ideation or behavior indicators observed or expressed   Agreement Not to Harm Self Yes  Description of Agreement verbal

## 2023-06-17 NOTE — ED Notes (Signed)
 Report called and given to [facility] staff. RN preparing to have pt discharged and readmitted by registration. Pt belongings being gathered and Security escort being contacted at this time. Pt will go to room 302 once everything is complete.

## 2023-06-17 NOTE — BHH Suicide Risk Assessment (Signed)
 [facility] Admission Suicide Risk Assessment   Nursing information obtained from:  Patient Demographic factors:  Low socioeconomic status, Divorced or widowed Current Mental Status:  Suicidal ideation indicated by patient Loss Factors:  Financial problems / change in socioeconomic status Historical Factors:  Impulsivity Risk Reduction Factors:  NA  Total Time spent with patient: 45 minutes Principal Problem: MDD (major depressive disorder), recurrent episode, severe (HCC) Diagnosis:  Principal Problem:   MDD (major depressive disorder), recurrent episode, severe (HCC) Active Problems:   Cocaine abuse, episodic (HCC)  Subjective Data: 68 year old female admitted voluntarily for worsening depression and SI in the context of cocaine use, homelessness, and medication noncompliance.  Continued Clinical Symptoms:  Alcohol Use Disorder Identification Test Final Score (AUDIT): 2 The "Alcohol Use Disorders Identification Test", Guidelines for Use in Primary Care, Second Edition.  World Science writer [facility]). Score between 0-7:  no or low risk or alcohol related problems. Score between 8-15:  moderate risk of alcohol related problems. Score between 16-19:  high risk of alcohol related problems. Score 20 or above:  warrants further diagnostic evaluation for alcohol dependence and treatment.   CLINICAL FACTORS:   Previous Psychiatric Diagnoses and Treatments   Musculoskeletal: Strength & Muscle Tone: decreased Gait & Station: unsteady Patient leans: N/A  Psychiatric Specialty Exam:  Presentation  General Appearance:  Casual  Eye Contact: Fair  Speech: Clear and Coherent  Speech Volume: Normal  Handedness: Right   Mood and Affect  Mood: Dysphoric  Affect: Congruent   Thought Process  Thought Processes: Linear  Descriptions of Associations:Intact  Orientation:Full (Time, Place and Person)  Thought Content:Rumination  History of Schizophrenia/Schizoaffective  disorder:No data recorded Duration of Psychotic Symptoms:No data recorded Hallucinations:Hallucinations: None  Ideas of Reference:None  Suicidal Thoughts:Suicidal Thoughts: No  Homicidal Thoughts:Homicidal Thoughts: No   Sensorium  Memory: Immediate Poor  Judgment: Poor  Insight: Poor   Executive Functions  Concentration: Poor  Attention Span: Fair  Recall: Fair  Fund of Knowledge: Fair  Language: Fair   Psychomotor Activity  Psychomotor Activity: Psychomotor Activity: Normal   Assets  Assets: Communication Skills; Desire for Improvement   Sleep  Sleep: Sleep: Fair    Physical Exam: Physical Exam Vitals and nursing note reviewed.  Constitutional:      Appearance: She is obese.  HENT:     Head: Atraumatic.  Eyes:     Extraocular Movements: Extraocular movements intact.  Pulmonary:     Effort: Pulmonary effort is normal.  Neurological:     Mental Status: She is alert and oriented to person, place, and time.  Psychiatric:        Behavior: Behavior normal.    Review of Systems  Psychiatric/Behavioral:  Positive for depression, substance abuse and suicidal ideas. Negative for hallucinations. The patient is nervous/anxious.    Blood pressure 111/65, pulse 73, temperature (!) 97.2 F (36.2 C), temperature source Oral, resp. rate 17, height 5\' 6"  (1.676 m), weight 107.5 kg, SpO2 93%. Body mass index is 38.25 kg/m.   COGNITIVE FEATURES THAT CONTRIBUTE TO RISK:  Loss of executive function    SUICIDE RISK:   Moderate:  Frequent suicidal ideation with limited intensity, and duration, some specificity in terms of plans, no associated intent, good self-control, limited dysphoria/symptomatology, some risk factors present, and identifiable protective factors, including available and accessible social support.  PLAN OF CARE:  Safety and Monitoring:   --  Voluntary admission to inpatient psychiatric unit for safety, stabilization and  treatment -- Daily contact with patient to assess and  evaluate symptoms and progress in treatment -- Patient's case to be discussed in multi-disciplinary team meeting -- Observation Level : q15 minute checks -- Vital signs:  q12 hours -- Precautions: suicide   I certify that inpatient services furnished can reasonably be expected to improve the patient's condition.   Fay Hoop, PA-C 06/17/2023, 1:57 PM

## 2023-06-17 NOTE — Plan of Care (Signed)
  Problem: Education: Goal: Knowledge of [facility] Education information/materials will improve Outcome: Progressing   Problem: Education: Goal: Emotional status will improve Outcome: Progressing   Problem: Education: Goal: Mental status will improve Outcome: Progressing   Problem: Education: Goal: Emotional status will improve Outcome: Progressing   Problem: Education: Goal: Knowledge of [facility] Education information/materials will improve Outcome: Progressing

## 2023-06-17 NOTE — Plan of Care (Signed)
  Problem: Education: Goal: Knowledge of [facility] Education information/materials will improve 06/17/2023 1456 by Derenda Flax, RN Outcome: Not Progressing 06/17/2023 1314 by Derenda Flax, RN Outcome: Not Progressing Goal: Emotional status will improve 06/17/2023 1456 by Derenda Flax, RN Outcome: Not Progressing 06/17/2023 1314 by Derenda Flax, RN Outcome: Not Progressing Goal: Mental status will improve 06/17/2023 1456 by Derenda Flax, RN Outcome: Not Progressing 06/17/2023 1314 by Derenda Flax, RN Outcome: Not Progressing Goal: Verbalization of understanding the information provided will improve 06/17/2023 1456 by Derenda Flax, RN Outcome: Not Progressing 06/17/2023 1314 by Derenda Flax, RN Outcome: Not Progressing   Problem: Activity: Goal: Interest or engagement in activities will improve 06/17/2023 1456 by Derenda Flax, RN Outcome: Not Progressing 06/17/2023 1314 by Derenda Flax, RN Outcome: Not Progressing Goal: Sleeping patterns will improve 06/17/2023 1456 by Derenda Flax, RN Outcome: Not Progressing 06/17/2023 1314 by Derenda Flax, RN Outcome: Not Progressing   Problem: Coping: Goal: Ability to verbalize frustrations and anger appropriately will improve 06/17/2023 1456 by Derenda Flax, RN Outcome: Not Progressing 06/17/2023 1314 by Derenda Flax, RN Outcome: Not Progressing Goal: Ability to demonstrate self-control will improve 06/17/2023 1456 by Derenda Flax, RN Outcome: Not Progressing 06/17/2023 1314 by Derenda Flax, RN Outcome: Not Progressing   Problem: Health Behavior/Discharge Planning: Goal: Identification of resources available to assist in meeting health care needs will improve 06/17/2023 1456 by Derenda Flax, RN Outcome: Not Progressing 06/17/2023 1314 by Derenda Flax, RN Outcome: Not Progressing Goal: Compliance with treatment plan for underlying cause of condition will  improve 06/17/2023 1456 by Derenda Flax, RN Outcome: Not Progressing 06/17/2023 1314 by Derenda Flax, RN Outcome: Not Progressing   Problem: Physical Regulation: Goal: Ability to maintain clinical measurements within normal limits will improve 06/17/2023 1456 by Derenda Flax, RN Outcome: Not Progressing 06/17/2023 1314 by Derenda Flax, RN Outcome: Not Progressing   Problem: Safety: Goal: Periods of time without injury will increase 06/17/2023 1456 by Derenda Flax, RN Outcome: Not Progressing 06/17/2023 1314 by Derenda Flax, RN Outcome: Not Progressing

## 2023-06-17 NOTE — Group Note (Signed)
 Date:  06/17/2023 Time:  6:02 PM  Group Topic/Focus:  Coping With Mental Health Crisis:   The purpose of this group is to help patients identify strategies for coping with mental health crisis.  Group discusses possible causes of crisis and ways to manage them effectively.  The purpose of this group is to help the patients identify that going outside and exercise is a great coping mechanism with mental health crisis. Healthy communication and team building exercises outside is a very therapeutic and healthy way to relieve stress.   Participation Level:  Active  Participation Quality:  Appropriate  Affect:  Appropriate  Cognitive:  Appropriate  Insight: Appropriate  Engagement in Group:  Engaged  Modes of Intervention:  Discussion  Additional Comments:    Jocie Meroney L Scherry Laverne 06/17/2023, 6:02 PM

## 2023-06-18 DIAGNOSIS — F333 Major depressive disorder, recurrent, severe with psychotic symptoms: Secondary | ICD-10-CM | POA: Diagnosis not present

## 2023-06-18 MED ORDER — ARIPIPRAZOLE 2 MG PO TABS
2.0000 mg | ORAL_TABLET | Freq: Every day | ORAL | Status: DC
  Administered 2023-06-19 – 2023-06-30 (×12): 2 mg via ORAL
  Filled 2023-06-18 (×12): qty 1

## 2023-06-18 NOTE — Progress Notes (Signed)
 Visible in dayroom, pleasant on engagement; attended and participated in wrap up group.  Pt denied SI/HI, AVH, and depression and anxiety rated mild.    06/17/23 2200  Psych Admission Type (Psych Patients Only)  Admission Status Voluntary  Psychosocial Assessment  Patient Complaints Anxiety  Eye Contact Fair  Facial Expression Flat  Affect Appropriate to circumstance  Speech Soft  Interaction Assertive  Motor Activity Shuffling  Appearance/Hygiene Disheveled;In scrubs  Behavior Characteristics Appropriate to situation  Mood Anxious;Pleasant  Aggressive Behavior  Effect No apparent injury  Thought Process  Coherency WDL  Content WDL  Delusions None reported or observed  Perception WDL  Hallucination None reported or observed  Judgment WDL  Confusion WDL  Danger to Self  Current suicidal ideation? Denies  Self-Injurious Behavior No self-injurious ideation or behavior indicators observed or expressed   Agreement Not to Harm Self Yes  Description of Agreement Verbal    Problem: Education: Goal: Knowledge of [facility] Education information/materials will improve Outcome: Progressing Goal: Emotional status will improve Outcome: Progressing Goal: Mental status will improve Outcome: Progressing Goal: Verbalization of understanding the information provided will improve Outcome: Progressing   Problem: Activity: Goal: Interest or engagement in activities will improve Outcome: Progressing Goal: Sleeping patterns will improve Outcome: Progressing   Problem: Coping: Goal: Ability to verbalize frustrations and anger appropriately will improve Outcome: Progressing Goal: Ability to demonstrate self-control will improve Outcome: Progressing

## 2023-06-18 NOTE — Plan of Care (Signed)

## 2023-06-18 NOTE — Progress Notes (Signed)
   06/18/23 0900  Psych Admission Type (Psych Patients Only)  Admission Status Voluntary  Psychosocial Assessment  Patient Complaints Agitation;Depression;Hopelessness  Eye Contact Fair  Facial Expression Flat  Affect Appropriate to circumstance  Speech Soft  Interaction Attention-seeking;Assertive  Motor Activity Shuffling  Appearance/Hygiene Disheveled;In scrubs  Behavior Characteristics Appropriate to situation  Mood Anxious  Aggressive Behavior  Targets Self (ENDORSES SI)  Effect No apparent injury  Thought Process  Coherency WDL  Content WDL  Delusions None reported or observed  Perception WDL  Hallucination None reported or observed  Judgment Impaired  Confusion WDL  Danger to Self  Current suicidal ideation? Passive  Self-Injurious Behavior Some self-injurious ideation observed or expressed.  No lethal plan expressed   Agreement Not to Harm Self Yes  Description of Agreement verbal

## 2023-06-18 NOTE — Group Note (Signed)
 Date:  06/18/2023 Time:  11:56 AM  Group Topic/Focus:  Goals Group:   The focus of this group is to help patients establish daily goals to achieve during treatment and discuss how the patient can incorporate goal setting into their daily lives to aide in recovery.    Participation Level:  Did Not Attend  Participation Quality:    Affect:    Cognitive:    Insight:   Engagement in Group:    Modes of Intervention:    Additional Comments:    Oma Alpert 06/18/2023, 11:56 AM

## 2023-06-18 NOTE — Progress Notes (Signed)
 [facility] MD Progress Note  06/18/2023 1:26 PM Caitlin Rivera  MRN:  [id number]  68 year old female presented voluntarily to the ED for evaluation of depression and suicidal ideation. They are admitted to behavioral health. There is a known history of depression, DM, morbid obesity, hypercholesterolemia, hypertension, COPD, homelessness, and medication noncompliance.  Patient reports intent to overdose on her medications, though indicated that she had been not been compliant with her medications recently for over a week, citing forgetfulness and stating that the medications "don't work." She also reports being homeless for the past 2 years and feeling unsupported and hungry. During the ED and psychiatric consult, patient expressed feelings of hopelessness, helplessness, and frustration, stating "I'll just go get run over by a train."  On exam today she continues to endorse feelings of hopelessness, helplessness, anhedonia, decreased energy, and decreased concentration.  She notes feelings of hopelessness and rates depression as 10 out of 10.  She also notes feelings of guilt and worry rated anxiety at 10 out of 10 as well.   Subjective:  Chart reviewed, case discussed in multidisciplinary meeting, patient seen during rounds.   06/18/23: On exam patient is found sitting up in bed they are alert and oriented x 3.  They are pleasant and cooperative with exam.  They note passive SI without plan or intent.  They continue to note depressive symptoms of hopelessness, helplessness, and frustration.  They note auditory hallucinations feeling that there having conversations with her dead family members.  They are linear on exam.  They note stable appetite and sleep.  They report they have been using their walker on the unit.  They report they have been able to engage in their ADLs and got a shower yesterday.  They voiced no concerns or complaints.  Discussed starting low-dose of Abilify given ongoing psychotic symptoms.   Patient was agreeable with this plan. Depression 9/10 anxiety 8/10. Discuss plan for discharge and they are uncertain about treatment, note they are currently homeless. Discussed possibility of going to shelter.   Although the patient endorses auditory hallucinations, they do not appear internally preoccupied or disorganized during the interview. Thought processes are goal-directed, and no evidence of active psychosis, paranoia, or delusional thinking is observed. Given the persistence of mild psychotic symptoms without overt behavioral disruption, Aripiprazole 2 mg daily is being initiated to provide additional antipsychotic coverage while minimizing sedation and metabolic risk. The patient was educated on potential side effects, including akathisia and restlessness. Will monitor response and tolerability closely.               MDD:             - Continue Cymbalta  20 mg twice daily  - Start abilify 2 mg daily             PTSD rule out             - Prazosin  1 mg nightly             Anxiety/insomnia             - Hydroxyzine  50 mg at bedtime             - gabapentin  600 mg twice daily    Sleep: Fair  Appetite:  Fair  Past Psychiatric History: see h&P Family History:  Family History  Problem Relation Age of Onset   Cancer Mother    Social History:  Social History   Substance and Sexual Activity  Alcohol Use No  Social History   Substance and Sexual Activity  Drug Use Yes   Types: "Crack" cocaine   Comment: crack-last smoked 2 wks ago-07-24-19, states she uses once a month    Social History   Socioeconomic History   Marital status: Widowed    Spouse name: Not on file   Number of children: Not on file   Years of education: Not on file   Highest education level: Not on file  Occupational History   Not on file  Tobacco Use   Smoking status: Every Day    Current packs/day: 0.50    Average packs/day: 0.5 packs/day for 35.4 years (17.7 ttl pk-yrs)    Types: Cigarettes     Start date: 27   Smokeless tobacco: Never  Substance and Sexual Activity   Alcohol use: No   Drug use: Yes    Types: "Crack" cocaine    Comment: crack-last smoked 2 wks ago-07-24-19, states she uses once a month   Sexual activity: Not on file  Other Topics Concern   Not on file  Social History Narrative   Not on file   Social Drivers of Health   Financial Resource Strain: Not on file  Food Insecurity: Patient Declined (06/17/2023)   Hunger Vital Sign    Worried About Running Out of Food in the Last Year: Patient declined    Ran Out of Food in the Last Year: Patient declined  Transportation Needs: Unmet Transportation Needs (06/17/2023)   PRAPARE - Administrator, Civil Service (Medical): Yes    Lack of Transportation (Non-Medical): Yes  Physical Activity: Not on file  Stress: Not on file  Social Connections: Moderately Isolated (06/17/2023)   Social Connection and Isolation Panel [NHANES]    Frequency of Communication with Friends and Family: Twice a week    Frequency of Social Gatherings with Friends and Family: Twice a week    Attends Religious Services: 1 to 4 times per year    Active Member of [employer] or Organizations: No    Attends Banker Meetings: Never    Marital Status: Widowed   Past Medical History:  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   COPD (chronic obstructive pulmonary disease) (HCC)    smoker   Diabetes mellitus without complication (HCC)    Fibroids    Hypertension    Personal history of radiation therapy    Smoker     Past Surgical History:  Procedure Laterality Date   BREAST BIOPSY Bilateral 06/03/2019   BREAST BIOPSY Left 06/14/2019   BREAST EXCISIONAL BIOPSY Right 07/2019   BREAST LUMPECTOMY Left 07/2019   BREAST LUMPECTOMY WITH RADIOACTIVE SEED LOCALIZATION Bilateral 07/31/2019   Procedure: BILATERAL BREAST LUMPECTOMY WITH RADIOACTIVE SEED LOCALIZATION;  Surgeon: Caralyn Chandler, MD;  Location: [facility];  Service: General;  Laterality: Bilateral;   TUBAL LIGATION     TUBAL LIGATION      Current Medications: Current Facility-Administered Medications  Medication Dose Route Frequency Provider Last Rate Last Admin   acetaminophen  (TYLENOL ) tablet 650 mg  650 mg Oral Q6H PRN Bobbitt, Shalon E, NP       albuterol  (VENTOLIN  HFA) 108 (90 Base) MCG/ACT inhaler 1-2 puff  1-2 puff Inhalation Q6H PRN Bobbitt, Shalon E, NP   2 puff at 06/18/23 0844   alum & mag hydroxide-simeth (MAALOX/MYLANTA) 200-200-20 MG/5ML suspension 30 mL  30 mL Oral Q4H PRN Bobbitt,  Shalon E, NP       amLODipine  (NORVASC ) tablet 5 mg  5 mg Oral Daily Bobbitt, Shalon E, NP   5 mg at 06/18/23 0844   [START ON 06/19/2023] ARIPiprazole (ABILIFY) tablet 2 mg  2 mg Oral Daily Cariana Karge E, PA-C       haloperidol (HALDOL) tablet 5 mg  5 mg Oral TID PRN Bobbitt, Shalon E, NP       And   diphenhydrAMINE  (BENADRYL ) capsule 50 mg  50 mg Oral TID PRN Bobbitt, Shalon E, NP       haloperidol lactate (HALDOL) injection 5 mg  5 mg Intramuscular TID PRN Bobbitt, Shalon E, NP       And   diphenhydrAMINE  (BENADRYL ) injection 50 mg  50 mg Intramuscular TID PRN Bobbitt, Shalon E, NP       And   LORazepam (ATIVAN) injection 2 mg  2 mg Intramuscular TID PRN Bobbitt, Shalon E, NP       DULoxetine  (CYMBALTA ) DR capsule 20 mg  20 mg Oral BID Bobbitt, Shalon E, NP   20 mg at 06/18/23 0844   fluticasone  furoate-vilanterol (BREO ELLIPTA ) 200-25 MCG/ACT 1 puff  1 puff Inhalation Daily Bobbitt, Shalon E, NP   1 puff at 06/18/23 0845   gabapentin  (NEURONTIN ) capsule 600 mg  600 mg Oral BID Bobbitt, Shalon E, NP   600 mg at 06/18/23 0843   hydrOXYzine  (ATARAX ) tablet 50 mg  50 mg Oral QHS Bobbitt, Shalon E, NP   50 mg at 06/17/23 2106   magnesium  hydroxide (MILK OF MAGNESIA) suspension 30 mL  30 mL Oral Daily PRN Bobbitt, Shalon E, NP       metFORMIN  (GLUCOPHAGE ) tablet 500 mg  500 mg Oral Q breakfast  Bobbitt, Shalon E, NP   500 mg at 06/18/23 0844   prazosin  (MINIPRESS ) capsule 1 mg  1 mg Oral QHS Bobbitt, Shalon E, NP   1 mg at 06/17/23 2106   rosuvastatin  (CRESTOR ) tablet 20 mg  20 mg Oral Daily Bobbitt, Shalon E, NP   20 mg at 06/18/23 0844   tiZANidine  (ZANAFLEX ) tablet 4 mg  4 mg Oral Q8H PRN Bobbitt, Shalon E, NP   4 mg at 06/18/23 0849   traZODone (DESYREL) tablet 50 mg  50 mg Oral QHS PRN Bobbitt, Shalon E, NP        Lab Results: No results found for this or any previous visit (from the past 48 hours).  Blood Alcohol level:  Lab Results  Component Value Date   [facility] <15 06/15/2023   ETH <10 02/10/2023    Metabolic Disorder Labs: Lab Results  Component Value Date   HGBA1C 6.6 (H) 02/03/2023   MPG 142.72 02/03/2023   No results found for: "PROLACTIN" No results found for: "CHOL", "TRIG", "HDL", "CHOLHDL", "VLDL", "LDLCALC"  Physical Findings: AIMS:  , ,  ,  ,    CIWA:    COWS:      Psychiatric Specialty Exam:  Presentation  General Appearance:  Casual  Eye Contact: Fair  Speech: Clear and Coherent  Speech Volume: Normal    Mood and Affect  Mood: Depressed; Dysphoric  Affect: Congruent   Thought Process  Thought Processes: Linear  Descriptions of Associations:Intact  Orientation:Full (Time, Place and Person)  Thought Content:Rumination  Hallucinations:Hallucinations: Auditory Description of Auditory Hallucinations: speaks to dead family members  Ideas of Reference:None  Suicidal Thoughts:Suicidal Thoughts: Yes, Passive SI Passive Intent and/or Plan: Without Intent; Without Plan  Homicidal Thoughts:Homicidal Thoughts:  No   Sensorium  Memory: Immediate Poor  Judgment: Poor  Insight: Poor   Executive Functions  Concentration: Poor  Attention Span: Fair  Recall: [employer] of Knowledge: Fair  Language: Fair   Psychomotor Activity  Psychomotor Activity: Psychomotor Activity:  Normal  Musculoskeletal: Strength & Muscle Tone: within normal limits Gait & Station: unsteady Assets  Assets: Manufacturing systems engineer; Desire for Improvement    Physical Exam: Physical Exam Vitals and nursing note reviewed.  Constitutional:      Appearance: She is obese.  HENT:     Head: Atraumatic.  Eyes:     Extraocular Movements: Extraocular movements intact.  Pulmonary:     Effort: Pulmonary effort is normal.  Neurological:     Mental Status: She is alert and oriented to person, place, and time.  Psychiatric:        Behavior: Behavior normal.    Review of Systems  Psychiatric/Behavioral:  Positive for depression, hallucinations and suicidal ideas. Negative for substance abuse. The patient is nervous/anxious. The patient does not have insomnia.    Blood pressure 104/76, pulse 69, temperature 98.1 F (36.7 C), resp. rate 16, height 5\' 6"  (1.676 m), weight 107.5 kg, SpO2 94%. Body mass index is 38.25 kg/m.  Diagnosis: Principal Problem:   MDD (major depressive disorder), recurrent episode, severe (HCC) Active Problems:   Cocaine abuse, episodic (HCC)   PLAN: Safety and Monitoring:  -- Voluntary admission to inpatient psychiatric unit for safety, stabilization and treatment  -- Daily contact with patient to assess and evaluate symptoms and progress in treatment  -- Patient's case to be discussed in multi-disciplinary team meeting  -- Observation Level : q15 minute checks  -- Vital signs:  q12 hours  -- Precautions: suicide, elopement, and assault -- Encouraged patient to participate in unit milieu and in scheduled group therapies  2. Psychiatric Diagnoses and Treatment:                            MDD:             - Continue Cymbalta  20 mg twice daily  - Start abilify 2 mg daily             PTSD rule out             - Prazosin  1 mg nightly             Anxiety/insomnia             - Hydroxyzine  50 mg at bedtime             - gabapentin  600 mg twice daily                 - Although the patient endorses auditory hallucinations, they do not appear internally preoccupied or disorganized during the interview. Thought processes are goal-directed, and no evidence of active psychosis, paranoia, or delusional thinking is observed. Given the persistence of mild psychotic symptoms without overt behavioral disruption, Aripiprazole 2 mg daily is being initiated to provide additional antipsychotic coverage while minimizing sedation and metabolic risk. The patient was educated on potential side effects, including akathisia and restlessness. Will monitor response and tolerability closely. -- The risks/benefits/side-effects/alternatives to this medication were discussed in detail with the patient and time was given for questions. The patient consents to medication trial.                -- Metabolic profile and EKG monitoring obtained  while on an atypical antipsychotic (BMI: Lipid Panel: HbgA1c: QTc:)              -- Encouraged patient to participate in unit milieu and in scheduled group therapies        3. Medical Issues Being Addressed:   Home meds restarted. amlodipine  5 mg daily, Breo Ellipta  substituted for Symbicort , metformin  500 mg daily, rosuvastatin  20 mg daily. Consult to diabetes coordinator is placed.   4. Discharge Planning:   -- Social work and case management to assist with discharge planning and identification of hospital follow-up needs prior to discharge  -- Estimated LOS: 3-4 days  Fay Hoop, PA-C 06/18/2023, 1:26 PM

## 2023-06-18 NOTE — Group Note (Signed)
 Date:  06/18/2023 Time:  9:18 PM  Group Topic/Focus:  Wrap-Up Group:   The focus of this group is to help patients review their daily goal of treatment and discuss progress on daily workbooks.    Participation Level:  Active  Participation Quality:  Appropriate  Affect:  Appropriate  Cognitive:  Appropriate  Insight: Appropriate  Engagement in Group:  Engaged  Modes of Intervention:  Education and Exploration  Additional Comments:  Patient attended and participated in group tonight. She reports that her goal was to  stop trying to be so depress.  Today she come out of her room and talk to peers.  Eugena Herter Dacosta 06/18/2023, 9:18 PM

## 2023-06-18 NOTE — Group Note (Signed)
 Date:  06/18/2023 Time:  5:00 PM  Group Topic/Focus:  Activity Group: The focus of the group is to promote activity for the patients and encourage them to go outside to the courtyard and let them get some fresh air and exercise.    Participation Level:  Did Not Attend   Marianna Shirk Shi Grose 06/18/2023, 5:00 PM

## 2023-06-19 NOTE — Progress Notes (Signed)
 [facility] MD Progress Note  06/19/2023 6:31 PM Caitlin Rivera  MRN:  [id number]  68 year old female presented voluntarily to the ED for evaluation of depression and suicidal ideation. They are admitted to behavioral health. There is a known history of depression, DM, morbid obesity, hypercholesterolemia, hypertension, COPD, homelessness, and medication noncompliance.  Patient reports intent to overdose on her medications, though indicated that she had been not been compliant with her medications recently for over a week, citing forgetfulness and stating that the medications "don't work." She also reports being homeless for the past 2 years and feeling unsupported and hungry. During the ED and psychiatric consult, patient expressed feelings of hopelessness, helplessness, and frustration, stating "I'll just go get run over by a train."  On exam today she continues to endorse feelings of hopelessness, helplessness, anhedonia, decreased energy, and decreased concentration.  She notes feelings of hopelessness and rates depression as 10 out of 10.  She also notes feelings of guilt and worry rated anxiety at 10 out of 10 as well.   Subjective:  Chart reviewed, case discussed in multidisciplinary meeting, patient seen during rounds.   Patient seen today for follow-up psychiatric evaluation.  Upon approach she is in bed appears to be sleeping without distress requiring verbal stimulation per this provider's awake.  On awakening she is irritable reporting "I have been tossing and turning all night" she reports is having constant auditory hallucinations "they are saying all kinds of things I do not know".  Attempted to review with patient medication history as well as psychiatric history which she is unable to engage stating "it is all in my chart cannot you just read my chart".  She continues to verbalize thoughts to harm herself however denies intent or thoughts to act on any on any self-harm behaviors while in this  setting.  Patient denies medication side effects and there are none noted.   06/18/23: On exam patient is found sitting up in bed they are alert and oriented x 3.  They are pleasant and cooperative with exam.  They note passive SI without plan or intent.  They continue to note depressive symptoms of hopelessness, helplessness, and frustration.  They note auditory hallucinations feeling that there having conversations with her dead family members.  They are linear on exam.  They note stable appetite and sleep.  They report they have been using their walker on the unit.  They report they have been able to engage in their ADLs and got a shower yesterday.  They voiced no concerns or complaints.  Discussed starting low-dose of Abilify given ongoing psychotic symptoms.  Patient was agreeable with this plan. Depression 9/10 anxiety 8/10. Discuss plan for discharge and they are uncertain about treatment, note they are currently homeless. Discussed possibility of going to shelter.   Although the patient endorses auditory hallucinations, they do not appear internally preoccupied or disorganized during the interview. Thought processes are goal-directed, and no evidence of active psychosis, paranoia, or delusional thinking is observed. Given the persistence of mild psychotic symptoms without overt behavioral disruption, Aripiprazole 2 mg daily is being initiated to provide additional antipsychotic coverage while minimizing sedation and metabolic risk. The patient was educated on potential side effects, including akathisia and restlessness. Will monitor response and tolerability closely.               MDD:             - Continue Cymbalta  20 mg twice daily  - Start abilify 2 mg  daily             PTSD rule out             - Prazosin  1 mg nightly             Anxiety/insomnia             - Hydroxyzine  50 mg at bedtime             - gabapentin  600 mg twice daily    Sleep: Fair  Appetite:  Fair  Past Psychiatric  History: see h&P Family History:  Family History  Problem Relation Age of Onset   Cancer Mother    Social History:  Social History   Substance and Sexual Activity  Alcohol Use No     Social History   Substance and Sexual Activity  Drug Use Yes   Types: "Crack" cocaine   Comment: crack-last smoked 2 wks ago-07-24-19, states she uses once a month    Social History   Socioeconomic History   Marital status: Widowed    Spouse name: Not on file   Number of children: Not on file   Years of education: Not on file   Highest education level: Not on file  Occupational History   Not on file  Tobacco Use   Smoking status: Every Day    Current packs/day: 0.50    Average packs/day: 0.5 packs/day for 35.4 years (17.7 ttl pk-yrs)    Types: Cigarettes    Start date: 41   Smokeless tobacco: Never  Substance and Sexual Activity   Alcohol use: No   Drug use: Yes    Types: "Crack" cocaine    Comment: crack-last smoked 2 wks ago-07-24-19, states she uses once a month   Sexual activity: Not on file  Other Topics Concern   Not on file  Social History Narrative   Not on file   Social Drivers of Health   Financial Resource Strain: Not on file  Food Insecurity: Patient Declined (06/17/2023)   Hunger Vital Sign    Worried About Running Out of Food in the Last Year: Patient declined    Ran Out of Food in the Last Year: Patient declined  Transportation Needs: Unmet Transportation Needs (06/17/2023)   PRAPARE - Administrator, Civil Service (Medical): Yes    Lack of Transportation (Non-Medical): Yes  Physical Activity: Not on file  Stress: Not on file  Social Connections: Moderately Isolated (06/17/2023)   Social Connection and Isolation Panel [NHANES]    Frequency of Communication with Friends and Family: Twice a week    Frequency of Social Gatherings with Friends and Family: Twice a week    Attends Religious Services: 1 to 4 times per year    Active Member of [employer] or  Organizations: No    Attends Banker Meetings: Never    Marital Status: Widowed   Past Medical History:  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   COPD (chronic obstructive pulmonary disease) (HCC)    smoker   Diabetes mellitus without complication (HCC)    Fibroids    Hypertension    Personal history of radiation therapy    Smoker     Past Surgical History:  Procedure Laterality Date   BREAST BIOPSY Bilateral 06/03/2019   BREAST BIOPSY Left 06/14/2019   BREAST EXCISIONAL BIOPSY Right 07/2019   BREAST LUMPECTOMY Left 07/2019   BREAST  LUMPECTOMY WITH RADIOACTIVE SEED LOCALIZATION Bilateral 07/31/2019   Procedure: BILATERAL BREAST LUMPECTOMY WITH RADIOACTIVE SEED LOCALIZATION;  Surgeon: Caralyn Chandler, MD;  Location: [facility];  Service: General;  Laterality: Bilateral;   TUBAL LIGATION     TUBAL LIGATION      Current Medications: Current Facility-Administered Medications  Medication Dose Route Frequency Provider Last Rate Last Admin   acetaminophen  (TYLENOL ) tablet 650 mg  650 mg Oral Q6H PRN Bobbitt, Shalon E, NP       albuterol  (VENTOLIN  HFA) 108 (90 Base) MCG/ACT inhaler 1-2 puff  1-2 puff Inhalation Q6H PRN Bobbitt, Shalon E, NP   2 puff at 06/18/23 0844   alum & mag hydroxide-simeth (MAALOX/MYLANTA) 200-200-20 MG/5ML suspension 30 mL  30 mL Oral Q4H PRN Bobbitt, Shalon E, NP       amLODipine  (NORVASC ) tablet 5 mg  5 mg Oral Daily Bobbitt, Shalon E, NP   5 mg at 06/19/23 0813   ARIPiprazole (ABILIFY) tablet 2 mg  2 mg Oral Daily Millington, Matthew E, PA-C   2 mg at 06/19/23 0814   haloperidol (HALDOL) tablet 5 mg  5 mg Oral TID PRN Bobbitt, Shalon E, NP       And   diphenhydrAMINE  (BENADRYL ) capsule 50 mg  50 mg Oral TID PRN Bobbitt, Shalon E, NP       haloperidol lactate (HALDOL) injection 5 mg  5 mg Intramuscular TID PRN Bobbitt, Shalon E, NP       And   diphenhydrAMINE  (BENADRYL ) injection  50 mg  50 mg Intramuscular TID PRN Bobbitt, Shalon E, NP       And   LORazepam (ATIVAN) injection 2 mg  2 mg Intramuscular TID PRN Bobbitt, Shalon E, NP       DULoxetine  (CYMBALTA ) DR capsule 20 mg  20 mg Oral BID Bobbitt, Shalon E, NP   20 mg at 06/19/23 1709   fluticasone  furoate-vilanterol (BREO ELLIPTA ) 200-25 MCG/ACT 1 puff  1 puff Inhalation Daily Bobbitt, Shalon E, NP   1 puff at 06/19/23 0815   gabapentin  (NEURONTIN ) capsule 600 mg  600 mg Oral BID Bobbitt, Shalon E, NP   600 mg at 06/19/23 1709   hydrOXYzine  (ATARAX ) tablet 50 mg  50 mg Oral QHS Bobbitt, Shalon E, NP   50 mg at 06/18/23 2123   magnesium  hydroxide (MILK OF MAGNESIA) suspension 30 mL  30 mL Oral Daily PRN Bobbitt, Shalon E, NP       metFORMIN  (GLUCOPHAGE ) tablet 500 mg  500 mg Oral Q breakfast Bobbitt, Shalon E, NP   500 mg at 06/19/23 0813   prazosin  (MINIPRESS ) capsule 1 mg  1 mg Oral QHS Bobbitt, Shalon E, NP   1 mg at 06/18/23 2123   rosuvastatin  (CRESTOR ) tablet 20 mg  20 mg Oral Daily Bobbitt, Shalon E, NP   20 mg at 06/19/23 0813   tiZANidine  (ZANAFLEX ) tablet 4 mg  4 mg Oral Q8H PRN Bobbitt, Shalon E, NP   4 mg at 06/19/23 0814   traZODone (DESYREL) tablet 50 mg  50 mg Oral QHS PRN Bobbitt, Shalon E, NP   50 mg at 06/18/23 2133    Lab Results: No results found for this or any previous visit (from the past 48 hours).  Blood Alcohol level:  Lab Results  Component Value Date   [facility] <15 06/15/2023   ETH <10 02/10/2023    Metabolic Disorder Labs: Lab Results  Component Value Date   HGBA1C 6.6 (H) 02/03/2023  MPG 142.72 02/03/2023   No results found for: "PROLACTIN" No results found for: "CHOL", "TRIG", "HDL", "CHOLHDL", "VLDL", "LDLCALC"  Physical Findings: AIMS:  , ,  ,  ,    CIWA:    COWS:      Psychiatric Specialty Exam:  Presentation  General Appearance:  Casual  Eye Contact: Fair  Speech: Clear and Coherent  Speech Volume: Normal    Mood and Affect  Mood: Depressed;  Dysphoric  Affect: Congruent   Thought Process  Thought Processes: Linear  Descriptions of Associations:Intact  Orientation:Full (Time, Place and Person)  Thought Content:Rumination  Hallucinations:Hallucinations: Auditory Description of Auditory Hallucinations: speaks to dead family members  Ideas of Reference:None  Suicidal Thoughts:Suicidal Thoughts: Yes, Passive SI Passive Intent and/or Plan: Without Intent; Without Plan  Homicidal Thoughts:Homicidal Thoughts: No   Sensorium  Memory: Immediate Poor  Judgment: Poor  Insight: Poor   Executive Functions  Concentration: Poor  Attention Span: Fair  Recall: [employer] of Knowledge: Fair  Language: Fair   Psychomotor Activity  Psychomotor Activity: Psychomotor Activity: Normal  Musculoskeletal: Strength & Muscle Tone: within normal limits Gait & Station: unsteady Assets  Assets: Manufacturing systems engineer; Desire for Improvement    Physical Exam: Physical Exam Vitals and nursing note reviewed.  Constitutional:      Appearance: She is obese.  HENT:     Head: Atraumatic.  Eyes:     Extraocular Movements: Extraocular movements intact.  Pulmonary:     Effort: Pulmonary effort is normal.  Neurological:     Mental Status: She is alert and oriented to person, place, and time.  Psychiatric:        Behavior: Behavior normal.    Review of Systems  Psychiatric/Behavioral:  Positive for depression, hallucinations and suicidal ideas. Negative for substance abuse. The patient is nervous/anxious. The patient does not have insomnia.    Blood pressure (!) 158/71, pulse 88, temperature (!) 97.3 F (36.3 C), resp. rate 18, height 5\' 6"  (1.676 m), weight 107.5 kg, SpO2 96%. Body mass index is 38.25 kg/m.  Diagnosis: Principal Problem:   MDD (major depressive disorder), recurrent episode, severe (HCC) Active Problems:   Cocaine abuse, episodic (HCC)   PLAN: Safety and Monitoring:  -- Voluntary  admission to inpatient psychiatric unit for safety, stabilization and treatment  -- Daily contact with patient to assess and evaluate symptoms and progress in treatment  -- Patient's case to be discussed in multi-disciplinary team meeting  -- Observation Level : q15 minute checks  -- Vital signs:  q12 hours  -- Precautions: suicide, elopement, and assault -- Encouraged patient to participate in unit milieu and in scheduled group therapies  2. Psychiatric Diagnoses and Treatment:                            MDD:             - Continue Cymbalta  20 mg twice daily  - Start abilify 2 mg daily             PTSD rule out             - Prazosin  1 mg nightly             Anxiety/insomnia             - Hydroxyzine  50 mg at bedtime             - gabapentin  600 mg twice daily                -  Although the patient endorses auditory hallucinations, they do not appear internally preoccupied or disorganized during the interview. Thought processes are goal-directed, and no evidence of active psychosis, paranoia, or delusional thinking is observed. Given the persistence of mild psychotic symptoms without overt behavioral disruption, Aripiprazole 2 mg daily is being initiated to provide additional antipsychotic coverage while minimizing sedation and metabolic risk. The patient was educated on potential side effects, including akathisia and restlessness. Will monitor response and tolerability closely. -- The risks/benefits/side-effects/alternatives to this medication were discussed in detail with the patient and time was given for questions. The patient consents to medication trial.                -- Metabolic profile and EKG monitoring obtained while on an atypical antipsychotic (BMI: Lipid Panel: HbgA1c: QTc:)              -- Encouraged patient to participate in unit milieu and in scheduled group therapies        3. Medical Issues Being Addressed:   Home meds restarted. amlodipine  5 mg daily, Breo Ellipta   substituted for Symbicort , metformin  500 mg daily, rosuvastatin  20 mg daily. Consult to diabetes coordinator is placed.   4. Discharge Planning:   -- Social work and case management to assist with discharge planning and identification of hospital follow-up needs prior to discharge  -- Estimated LOS: 3-4 days  Angelia Barcelona, NP 06/19/2023, 6:31 PM

## 2023-06-19 NOTE — Progress Notes (Signed)
 Pt calm and pleasant during assessment denying HI/AVH. Pt endorses passive SI, verbally contracts for safety with this Clinical research associate. Pt observed by this Clinical research associate interacting appropriately with staff and peers on the unit. Pt compliant with medications per MD orders. Pt given education, support, and encouragement to be active in her treatment plan. Pt being monitored Q 15 minutes for safety per unit protocol, remains safe on the unit

## 2023-06-19 NOTE — Plan of Care (Signed)
  Problem: Education: Goal: Knowledge of [facility] Education information/materials will improve Outcome: Progressing Goal: Mental status will improve Outcome: Progressing Goal: Verbalization of understanding the information provided will improve Outcome: Progressing   Problem: Coping: Goal: Ability to verbalize frustrations and anger appropriately will improve Outcome: Progressing   Problem: Physical Regulation: Goal: Ability to maintain clinical measurements within normal limits will improve Outcome: Progressing   Problem: Safety: Goal: Periods of time without injury will increase Outcome: Progressing

## 2023-06-19 NOTE — Progress Notes (Signed)
   06/19/23 1100  Psych Admission Type (Psych Patients Only)  Admission Status Voluntary  Psychosocial Assessment  Patient Complaints Sleep disturbance;Depression  Eye Contact Fair  Facial Expression Other (Comment) (WNL)  Affect Appropriate to circumstance  Speech Logical/coherent  Interaction Assertive  Motor Activity Slow  Appearance/Hygiene In scrubs  Behavior Characteristics Irritable;Cooperative  Mood Irritable  Thought Process  Coherency WDL  Content WDL  Delusions None reported or observed  Perception WDL  Hallucination None reported or observed  Judgment Impaired  Confusion None  Danger to Self  Current suicidal ideation? Passive  Agreement Not to Harm Self Yes  Description of Agreement verbal  Danger to Others  Danger to Others None reported or observed   Patient appears so sleepy this morning. Stated that she couldn't sleep last night. Patient verbalized passive SI.Contracts for safety. Visible in the milieu this afternoon. Support and encouragement given.

## 2023-06-19 NOTE — Plan of Care (Signed)
 Caitlin Rivera is a 68 y.o. female patient. No diagnosis found. 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   COPD (chronic obstructive pulmonary disease) (HCC)    smoker   Diabetes mellitus without complication (HCC)    Fibroids    Hypertension    Personal history of radiation therapy    Smoker    Current Facility-Administered Medications  Medication Dose Route Frequency Provider Last Rate Last Admin   acetaminophen  (TYLENOL ) tablet 650 mg  650 mg Oral Q6H PRN Bobbitt, Shalon E, NP       albuterol  (VENTOLIN  HFA) 108 (90 Base) MCG/ACT inhaler 1-2 puff  1-2 puff Inhalation Q6H PRN Bobbitt, Shalon E, NP   2 puff at 06/18/23 0844   alum & mag hydroxide-simeth (MAALOX/MYLANTA) 200-200-20 MG/5ML suspension 30 mL  30 mL Oral Q4H PRN Bobbitt, Shalon E, NP       amLODipine  (NORVASC ) tablet 5 mg  5 mg Oral Daily Bobbitt, Shalon E, NP   5 mg at 06/18/23 0844   ARIPiprazole (ABILIFY) tablet 2 mg  2 mg Oral Daily Millington, Matthew E, PA-C       haloperidol (HALDOL) tablet 5 mg  5 mg Oral TID PRN Bobbitt, Shalon E, NP       And   diphenhydrAMINE  (BENADRYL ) capsule 50 mg  50 mg Oral TID PRN Bobbitt, Shalon E, NP       haloperidol lactate (HALDOL) injection 5 mg  5 mg Intramuscular TID PRN Bobbitt, Shalon E, NP       And   diphenhydrAMINE  (BENADRYL ) injection 50 mg  50 mg Intramuscular TID PRN Bobbitt, Shalon E, NP       And   LORazepam (ATIVAN) injection 2 mg  2 mg Intramuscular TID PRN Bobbitt, Shalon E, NP       DULoxetine  (CYMBALTA ) DR capsule 20 mg  20 mg Oral BID Bobbitt, Shalon E, NP   20 mg at 06/18/23 1649   fluticasone  furoate-vilanterol (BREO ELLIPTA ) 200-25 MCG/ACT 1 puff  1 puff Inhalation Daily Bobbitt, Shalon E, NP   1 puff at 06/18/23 0845   gabapentin  (NEURONTIN ) capsule 600 mg  600 mg Oral BID Bobbitt, Shalon E, NP   600 mg at 06/18/23 1649   hydrOXYzine  (ATARAX ) tablet 50 mg  50 mg Oral QHS Bobbitt, Shalon E, NP   50 mg at  06/18/23 2123   magnesium  hydroxide (MILK OF MAGNESIA) suspension 30 mL  30 mL Oral Daily PRN Bobbitt, Shalon E, NP       metFORMIN  (GLUCOPHAGE ) tablet 500 mg  500 mg Oral Q breakfast Bobbitt, Shalon E, NP   500 mg at 06/18/23 0844   prazosin  (MINIPRESS ) capsule 1 mg  1 mg Oral QHS Bobbitt, Shalon E, NP   1 mg at 06/18/23 2123   rosuvastatin  (CRESTOR ) tablet 20 mg  20 mg Oral Daily Bobbitt, Shalon E, NP   20 mg at 06/18/23 0844   tiZANidine  (ZANAFLEX ) tablet 4 mg  4 mg Oral Q8H PRN Bobbitt, Shalon E, NP   4 mg at 06/18/23 0849   traZODone (DESYREL) tablet 50 mg  50 mg Oral QHS PRN Bobbitt, Shalon E, NP   50 mg at 06/18/23 2133   Allergies  Allergen Reactions   Ibuprofen Itching   Morphine  And Codeine Itching   Principal Problem:   MDD (major depressive disorder), recurrent episode, severe (HCC) Active Problems:   Cocaine abuse, episodic (HCC)  Blood  pressure (!) 145/70, pulse 85, temperature 97.7 F (36.5 C), temperature source Oral, resp. rate 16, height 5\' 6"  (1.676 m), weight 107.5 kg, SpO2 93%.  Patient was mildly irritated. Participated in group and endorse passive SI but denied HI this shift.   Tarius Stangelo B Naiyah Klostermann 06/19/2023

## 2023-06-19 NOTE — Group Note (Signed)
 Date:  06/19/2023 Time:  9:11 PM  Group Topic/Focus:  Coping With Mental Health Crisis:   The purpose of this group is to help patients identify strategies for coping with mental health crisis.  Group discusses possible causes of crisis and ways to manage them effectively. Self Care:   The focus of this group is to help patients understand the importance of self-care in order to improve or restore emotional, physical, spiritual, interpersonal, and financial health.    Participation Level:  Active  Participation Quality:  Appropriate and Supportive  Affect:  Lethargic  Cognitive:  Lacking  Insight: Limited  Engagement in Group:  Limited  Modes of Intervention:  Discussion, Education, and Support  Additional Comments:  n/a  Cesario Weidinger L 06/19/2023, 9:11 PM

## 2023-06-19 NOTE — BH IP Treatment Plan (Signed)
 Interdisciplinary Treatment and Diagnostic Plan Update  06/19/2023 Time of Session: 10:09 AM Caitlin Rivera MRN: [id number]  Principal Diagnosis: MDD (major depressive disorder), recurrent episode, severe (HCC)  Secondary Diagnoses: Principal Problem:   MDD (major depressive disorder), recurrent episode, severe (HCC) Active Problems:   Cocaine abuse, episodic (HCC)   Current Medications:  Current Facility-Administered Medications  Medication Dose Route Frequency Provider Last Rate Last Admin   acetaminophen  (TYLENOL ) tablet 650 mg  650 mg Oral Q6H PRN Bobbitt, Shalon E, NP       albuterol  (VENTOLIN  HFA) 108 (90 Base) MCG/ACT inhaler 1-2 puff  1-2 puff Inhalation Q6H PRN Bobbitt, Shalon E, NP   2 puff at 06/18/23 0844   alum & mag hydroxide-simeth (MAALOX/MYLANTA) 200-200-20 MG/5ML suspension 30 mL  30 mL Oral Q4H PRN Bobbitt, Shalon E, NP       amLODipine  (NORVASC ) tablet 5 mg  5 mg Oral Daily Bobbitt, Shalon E, NP   5 mg at 06/19/23 0813   ARIPiprazole (ABILIFY) tablet 2 mg  2 mg Oral Daily Millington, Matthew E, PA-C   2 mg at 06/19/23 0814   haloperidol (HALDOL) tablet 5 mg  5 mg Oral TID PRN Bobbitt, Shalon E, NP       And   diphenhydrAMINE  (BENADRYL ) capsule 50 mg  50 mg Oral TID PRN Bobbitt, Shalon E, NP       haloperidol lactate (HALDOL) injection 5 mg  5 mg Intramuscular TID PRN Bobbitt, Shalon E, NP       And   diphenhydrAMINE  (BENADRYL ) injection 50 mg  50 mg Intramuscular TID PRN Bobbitt, Shalon E, NP       And   LORazepam (ATIVAN) injection 2 mg  2 mg Intramuscular TID PRN Bobbitt, Shalon E, NP       DULoxetine  (CYMBALTA ) DR capsule 20 mg  20 mg Oral BID Bobbitt, Shalon E, NP   20 mg at 06/19/23 0814   fluticasone  furoate-vilanterol (BREO ELLIPTA ) 200-25 MCG/ACT 1 puff  1 puff Inhalation Daily Bobbitt, Shalon E, NP   1 puff at 06/19/23 0815   gabapentin  (NEURONTIN ) capsule 600 mg  600 mg Oral BID Bobbitt, Shalon E, NP   600 mg at 06/19/23 0813   hydrOXYzine  (ATARAX )  tablet 50 mg  50 mg Oral QHS Bobbitt, Shalon E, NP   50 mg at 06/18/23 2123   magnesium  hydroxide (MILK OF MAGNESIA) suspension 30 mL  30 mL Oral Daily PRN Bobbitt, Shalon E, NP       metFORMIN  (GLUCOPHAGE ) tablet 500 mg  500 mg Oral Q breakfast Bobbitt, Shalon E, NP   500 mg at 06/19/23 0813   prazosin  (MINIPRESS ) capsule 1 mg  1 mg Oral QHS Bobbitt, Shalon E, NP   1 mg at 06/18/23 2123   rosuvastatin  (CRESTOR ) tablet 20 mg  20 mg Oral Daily Bobbitt, Shalon E, NP   20 mg at 06/19/23 0813   tiZANidine  (ZANAFLEX ) tablet 4 mg  4 mg Oral Q8H PRN Bobbitt, Shalon E, NP   4 mg at 06/19/23 0814   traZODone (DESYREL) tablet 50 mg  50 mg Oral QHS PRN Bobbitt, Shalon E, NP   50 mg at 06/18/23 2133   PTA Medications: Medications Prior to Admission  Medication Sig Dispense Refill Last Dose/Taking   albuterol  (VENTOLIN  HFA) 108 (90 Base) MCG/ACT inhaler Inhale 1-2 puffs into the lungs every 6 (six) hours as needed for wheezing or shortness of breath. 6.7 g 0    amLODipine  (NORVASC ) 5 MG tablet  Take 1 tablet (5 mg total) by mouth daily. 30 tablet 0    DULoxetine  (CYMBALTA ) 20 MG capsule Take 1 capsule (20 mg total) by mouth 2 (two) times daily. 60 capsule 0    gabapentin  (NEURONTIN ) 300 MG capsule Take 2 capsules (600 mg total) by mouth 2 (two) times daily. 120 capsule 0    hydrOXYzine  (ATARAX ) 50 MG tablet Take 1 tablet (50 mg total) by mouth at bedtime. 30 tablet 0    metFORMIN  (GLUCOPHAGE ) 500 MG tablet Take 1 tablet (500 mg total) by mouth daily with breakfast. 30 tablet 0    prazosin  (MINIPRESS ) 1 MG capsule Take 1 capsule (1 mg total) by mouth at bedtime. 30 capsule 0    rosuvastatin  (CRESTOR ) 20 MG tablet Take 1 tablet (20 mg total) by mouth daily. 30 tablet 0    SYMBICORT  160-4.5 MCG/ACT inhaler Inhale 2 puffs into the lungs in the morning and at bedtime. 6 g 0    tiZANidine  (ZANAFLEX ) 4 MG tablet Take 1 tablet (4 mg total) by mouth every 8 (eight) hours as needed for muscle spasms. 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   Physician Treatment Plan for Primary Diagnosis: MDD (major depressive disorder), recurrent episode, severe (HCC) Long Term Goal(s): Improvement in symptoms so as ready for discharge   Short Term Goals: Ability to identify and develop effective coping behaviors will improve Ability to maintain clinical measurements within normal limits will improve Compliance with prescribed medications will improve Ability to identify triggers associated with substance abuse/mental health issues will improve  Medication Management: Evaluate patient's response, side effects, and tolerance of medication regimen.  Therapeutic Interventions: 1 to 1 sessions, Unit Group sessions and Medication administration.  Evaluation of Outcomes: Not Met  Physician Treatment Plan for Secondary Diagnosis: Principal Problem:   MDD (major depressive disorder), recurrent episode, severe (HCC) Active Problems:   Cocaine abuse, episodic (HCC)  Long Term Goal(s): Improvement in symptoms so as ready for discharge   Short Term Goals: Ability to identify and develop effective coping behaviors will improve Ability to maintain clinical measurements within normal limits will improve Compliance with prescribed medications will improve Ability to identify triggers associated with substance abuse/mental health issues will improve     Medication Management: Evaluate patient's response, side effects, and tolerance of medication regimen.  Therapeutic Interventions: 1 to 1 sessions, Unit Group sessions and Medication administration.  Evaluation of Outcomes: Not Met   RN Treatment Plan  for Primary Diagnosis: MDD (major depressive disorder), recurrent episode, severe (HCC) Long Term Goal(s): Knowledge of disease and therapeutic regimen to maintain health will improve  Short Term Goals: Ability to verbalize frustration and anger appropriately will improve, Ability to demonstrate self-control, Ability to participate in decision making will improve, Ability to verbalize feelings will improve, Ability to disclose and discuss suicidal ideas, Ability to identify and develop effective coping behaviors will improve, and Compliance with prescribed medications will improve  Medication Management: RN will administer medications as ordered by provider, will assess and evaluate patient's response and provide education to patient for prescribed medication. RN will report any adverse and/or side effects to prescribing provider.  Therapeutic Interventions: 1 on 1 counseling sessions, Psychoeducation, Medication administration, Evaluate responses  to treatment, Monitor vital signs and CBGs as ordered, Perform/monitor CIWA, COWS, AIMS and Fall Risk screenings as ordered, Perform wound care treatments as ordered.  Evaluation of Outcomes: Not Met   LCSW Treatment Plan for Primary Diagnosis: MDD (major depressive disorder), recurrent episode, severe (HCC) Long Term Goal(s): Safe transition to appropriate next level of care at discharge, Engage patient in therapeutic group addressing interpersonal concerns.  Short Term Goals: Engage patient in aftercare planning with referrals and resources, Increase social support, Increase ability to appropriately verbalize feelings, Increase emotional regulation, Facilitate acceptance of mental health diagnosis and concerns, Facilitate patient progression through stages of change regarding substance use diagnoses and concerns, Identify triggers associated with mental health/substance abuse issues, and Increase skills for wellness and recovery  Therapeutic  Interventions: Assess for all discharge needs, 1 to 1 time with Social worker, Explore available resources and support systems, Assess for adequacy in community support network, Educate family and significant other(s) on suicide prevention, Complete Psychosocial Assessment, Interpersonal group therapy.  Evaluation of Outcomes: Not Met   Progress in Treatment: Attending groups: Yes. Participating in groups: Yes. Taking medication as prescribed: Yes. Toleration medication: Yes. Family/Significant other contact made: No, will contact:  CSW to contact once permission is granted.  Patient understands diagnosis: Yes. Discussing patient identified problems/goals with staff: Yes. Medical problems stabilized or resolved: Yes. and No. Denies suicidal/homicidal ideation: No. Issues/concerns per patient self-inventory: No. Other: None  New problem(s) identified: Yes, Describe:  Patient reports issues with her sleeping.   New Short Term/Long Term Goal(s):detox, elimination of symptoms of psychosis, medication management for mood stabilization; elimination of SI thoughts; development of comprehensive mental wellness/sobriety plan.    Patient Goals:  "To get better."  Discharge Plan or Barriers: CSW to assist with the development of appropriate discharge plan.   Reason for Continuation of Hospitalization: Aggression Anxiety Depression Hallucinations Mania Medication stabilization Suicidal ideation  Estimated Length of Stay: 1-7 days.   Last 3 [place] Suicide Severity Risk Score: Flowsheet Row Admission (Current) from 06/17/2023 in [facility]ICINE ED from 06/15/2023 in [facility] Emergency Department at [facility] ED from 04/21/2023 in [facility] Emergency Department at [facility]  C-SSRS RISK CATEGORY High Risk High Risk No Risk       Last PHQ 2/9 Scores:     No data to display          Scribe for Treatment Team: Guinn Delarosa M Esmeralda Blanford,  LCSW 06/19/2023 11:03 AM

## 2023-06-19 NOTE — Inpatient Diabetes Management (Signed)
 Inpatient Diabetes Program Recommendations  AACE/ADA: New Consensus Statement on Inpatient Glycemic Control (2015)  Target Ranges:  Prepandial:   less than 140 mg/dL      Peak postprandial:   less than 180 mg/dL (1-2 hours)      Critically ill patients:  140 - 180 mg/dL   Lab Results  Component Value Date   GLUCAP 117 (H) 06/16/2023   HGBA1C 6.6 (H) 02/03/2023    Review of Glycemic Control  Latest Reference Range & Units 06/16/23 08:45  Glucose-Capillary 70 - 99 mg/dL 161 (H)  (H): Data is abnormally high  Diabetes history: DM2 Outpatient Diabetes medications: Metformin  500 mg QD Current orders for Inpatient glycemic control: Metformin  500 mg QD  Received referral for DM management in the context of noncompliance.  Current A1C is 6.6% which indicates DM is well controlled.  Please consider:  Carb modified diet CBG's AC/HS  Thank you, Hays Lipschutz, MSN, CDCES Diabetes Coordinator Inpatient Diabetes Program [phone] (team pager from 8a-5p)

## 2023-06-19 NOTE — Group Note (Signed)
 Recreation Therapy Group Note   Group Topic:Healthy Support Systems  Group Date: 06/19/2023 Start Time: 1040 End Time: 1130 Facilitators: Deatrice Factor, LRT, CTRS Location: Craft Room  Group Description: Straw Bridge. In groups or individually, patients were given 10 plastic drinking straws and an equal length of masking tape. Using the materials provided, patients were instructed to build a free-standing bridge-like structure to suspend an everyday item (ex: deck of cards) off the floor or table surface. All materials were required to be used in Secondary school teacher. LRT facilitated post-activity discussion reviewing the importance of having strong and healthy support systems in our lives. LRT discussed how the people in our lives serve as the tape and the deck of cards we placed on top of our straw structure are the stressors we face in daily life. LRT and pts discussed what happens in our life when things get too heavy for us , and we don't have strong supports outside of the hospital. Pt shared 2 of their healthy supports in their life aloud in the group.   Goal Area(s) Addressed:  Patient will identify 2 healthy supports in their life. Patient will identify skills to successfully complete activity. Patient will identify correlation of this activity to life post-discharge.  Patient will build on frustration tolerance skills. Patient will increase team building and communication skills.    Affect/Mood: N/A   Participation Level: Minimal    Clinical Observations/Individualized Feedback: Laci was present in the craft room. Pt was falling asleep sitting up. Pt shared she has no support or coping skills.   Plan: Continue to engage patient in RT group sessions 2-3x/week.   Deatrice Factor, LRT, CTRS 06/19/2023 1:22 PM

## 2023-06-19 NOTE — Group Note (Signed)
 Date:  06/19/2023 Time:  5:20 PM  Group Topic/Focus:  Activity Group: The main focus of the group is to promote activity for the patients and encourage them to go outside to the courtyard and get some fresh air and some exercise.    Participation Level:  Did Not Attend   Caitlin Rivera [name] 06/19/2023, 5:20 PM

## 2023-06-19 NOTE — Group Note (Signed)
 Date:  06/19/2023 Time:  11:46 AM  Group Topic/Focus:  Wellness Toolbox:   The focus of this group is to discuss various aspects of wellness, balancing those aspects and exploring ways to increase the ability to experience wellness.  Patients will create a wellness toolbox for use upon discharge.    Participation Level:  Did Not Attend   Caitlin Rivera 06/19/2023, 11:46 AM

## 2023-06-20 NOTE — [facility] Suicide Risk Assessment (Signed)
 [facility] INPATIENT:  Family/Significant Other Suicide Prevention Education  Suicide Prevention Education:  Contact Attempts: Caitlin Rivera, daughter, [phone], (name of family member/significant other) has been identified by the patient as the family member/significant other with whom the patient will be residing, and identified as the person(s) who will aid the patient in the event of a mental health crisis.  With written consent from the patient, two attempts were made to provide suicide prevention education, prior to and/or following the patient's discharge.  We were unsuccessful in providing suicide prevention education.  A suicide education pamphlet was given to the patient to share with family/significant other.  Date and time of first attempt:06/20/2023 at 1:20PM Date and time of second attempt:   Second attempt is needed  CSW left HIPAA compliant voicemail requesting a return call.  [name] 06/20/2023, 1:19 PM

## 2023-06-20 NOTE — Plan of Care (Signed)
   Problem: Education: Goal: Emotional status will improve Outcome: Progressing Goal: Mental status will improve Outcome: Progressing

## 2023-06-20 NOTE — Group Note (Signed)
 Date:  06/20/2023 Time:  11:10 AM  Group Topic/Focus:  Wellness Toolbox:   The focus of this group is to discuss various aspects of wellness, balancing those aspects and exploring ways to increase the ability to experience wellness.  Patients will create a wellness toolbox for use upon discharge.    Participation Level:  Active  Participation Quality:  Appropriate  Affect:  Appropriate  Cognitive:  Appropriate  Insight: Appropriate  Engagement in Group:  Engaged  Modes of Intervention:  Activity  Additional Comments:    Caitlin Rivera 06/20/2023, 11:10 AM

## 2023-06-20 NOTE — Progress Notes (Signed)
 [facility] MD Progress Note  06/20/2023 9:25 AM Caitlin Rivera  MRN:  [id number]  68 year old female presented voluntarily to the ED for evaluation of depression and suicidal ideation. They are admitted to behavioral health. There is a known history of depression, DM, morbid obesity, hypercholesterolemia, hypertension, COPD, homelessness, and medication noncompliance.  Patient reports intent to overdose on her medications, though indicated that she had been not been compliant with her medications recently for over a week, citing forgetfulness and stating that the medications "don't work." She also reports being homeless for the past 2 years and feeling unsupported and hungry. During the ED and psychiatric consult, patient expressed feelings of hopelessness, helplessness, and frustration, stating "I'll just go get run over by a train."  On exam today she continues to endorse feelings of hopelessness, helplessness, anhedonia, decreased energy, and decreased concentration.  She notes feelings of hopelessness and rates depression as 10 out of 10.  She also notes feelings of guilt and worry rated anxiety at 10 out of 10 as well.   Subjective:  Chart reviewed, case discussed in multidisciplinary meeting, patient seen during rounds.   Patient seen today for follow-up psychiatric evaluation.  Upon approach she is in bed appears to be sleeping without distress however awakes easily to verbal greeting.  She reports she is sleeping better "yeah I had a better night" and reports her mood is better.  This is evident in her affect as she denies depression and anxiety.  She currently denies any auditory hallucinations stating "yeah they getting better".  Patient reports she has not been sleeping well she has been homeless prior to this admission stating "I was living with this man until he kicked me out".  Patient reports she has no resources for housing but is interested in connecting with social work related.  We reviewed  medications and she denies any side effects and there are none noted.  She denies any thoughts to harm herself or anyone else.  She reports her sleep is improved as well as her appetite.  There are no self-harm or aggressive behaviors noted.  She denies any thoughts to harm herself or anyone else.  Today she engages well in conversation her thoughts are linear and goal-directed with no paranoid or delusional content.  She has been noted on the unit interacting well with staff during mealtime earlier this morning.  Abilify 2 mg daily has been initiated to provide antipsychotic coverage while minimizing sedation and metabolic risk as well as adjunct for depression.  Although patient has endorsed auditory hallucinations she has not appear overtly psychotic or preoccupied during this stay. Medication education to include potential side effects and mechanism of action reviewed with patient who verbalizes understanding.                 MDD:             - Continue Cymbalta  20 mg twice daily  - Start abilify 2 mg daily             PTSD rule out             - Prazosin  1 mg nightly             Anxiety/insomnia             - Hydroxyzine  50 mg at bedtime             - gabapentin  600 mg twice daily    Sleep: Fair  Appetite:  Fair  Past  Psychiatric History: see h&P Family History:  Family History  Problem Relation Age of Onset   Cancer Mother    Social History:  Social History   Substance and Sexual Activity  Alcohol Use No     Social History   Substance and Sexual Activity  Drug Use Yes   Types: "Crack" cocaine   Comment: crack-last smoked 2 wks ago-07-24-19, states she uses once a month    Social History   Socioeconomic History   Marital status: Widowed    Spouse name: Not on file   Number of children: Not on file   Years of education: Not on file   Highest education level: Not on file  Occupational History   Not on file  Tobacco Use   Smoking status: Every Day    Current  packs/day: 0.50    Average packs/day: 0.5 packs/day for 35.4 years (17.7 ttl pk-yrs)    Types: Cigarettes    Start date: 11   Smokeless tobacco: Never  Substance and Sexual Activity   Alcohol use: No   Drug use: Yes    Types: "Crack" cocaine    Comment: crack-last smoked 2 wks ago-07-24-19, states she uses once a month   Sexual activity: Not on file  Other Topics Concern   Not on file  Social History Narrative   Not on file   Social Drivers of Health   Financial Resource Strain: Not on file  Food Insecurity: Patient Declined (06/17/2023)   Hunger Vital Sign    Worried About Running Out of Food in the Last Year: Patient declined    Ran Out of Food in the Last Year: Patient declined  Transportation Needs: Unmet Transportation Needs (06/17/2023)   PRAPARE - Administrator, Civil Service (Medical): Yes    Lack of Transportation (Non-Medical): Yes  Physical Activity: Not on file  Stress: Not on file  Social Connections: Moderately Isolated (06/17/2023)   Social Connection and Isolation Panel [NHANES]    Frequency of Communication with Friends and Family: Twice a week    Frequency of Social Gatherings with Friends and Family: Twice a week    Attends Religious Services: 1 to 4 times per year    Active Member of [employer] or Organizations: No    Attends Banker Meetings: Never    Marital Status: Widowed   Past Medical History:  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   COPD (chronic obstructive pulmonary disease) (HCC)    smoker   Diabetes mellitus without complication (HCC)    Fibroids    Hypertension    Personal history of radiation therapy    Smoker     Past Surgical History:  Procedure Laterality Date   BREAST BIOPSY Bilateral 06/03/2019   BREAST BIOPSY Left 06/14/2019   BREAST EXCISIONAL BIOPSY Right 07/2019   BREAST LUMPECTOMY Left 07/2019   BREAST LUMPECTOMY WITH RADIOACTIVE SEED LOCALIZATION  Bilateral 07/31/2019   Procedure: BILATERAL BREAST LUMPECTOMY WITH RADIOACTIVE SEED LOCALIZATION;  Surgeon: Caralyn Chandler, MD;  Location: [facility];  Service: General;  Laterality: Bilateral;   TUBAL LIGATION     TUBAL LIGATION      Current Medications: Current Facility-Administered Medications  Medication Dose Route Frequency Provider Last Rate Last Admin   acetaminophen  (TYLENOL ) tablet 650 mg  650 mg Oral Q6H PRN Bobbitt, Shalon E, NP       albuterol  (VENTOLIN  HFA) 108 (90  Base) MCG/ACT inhaler 1-2 puff  1-2 puff Inhalation Q6H PRN Bobbitt, Shalon E, NP   2 puff at 06/18/23 0844   alum & mag hydroxide-simeth (MAALOX/MYLANTA) 200-200-20 MG/5ML suspension 30 mL  30 mL Oral Q4H PRN Bobbitt, Shalon E, NP       amLODipine  (NORVASC ) tablet 5 mg  5 mg Oral Daily Bobbitt, Shalon E, NP   5 mg at 06/20/23 0841   ARIPiprazole (ABILIFY) tablet 2 mg  2 mg Oral Daily Millington, Matthew E, PA-C   2 mg at 06/20/23 1610   haloperidol (HALDOL) tablet 5 mg  5 mg Oral TID PRN Bobbitt, Shalon E, NP       And   diphenhydrAMINE  (BENADRYL ) capsule 50 mg  50 mg Oral TID PRN Bobbitt, Shalon E, NP       haloperidol lactate (HALDOL) injection 5 mg  5 mg Intramuscular TID PRN Bobbitt, Shalon E, NP       And   diphenhydrAMINE  (BENADRYL ) injection 50 mg  50 mg Intramuscular TID PRN Bobbitt, Shalon E, NP       And   LORazepam (ATIVAN) injection 2 mg  2 mg Intramuscular TID PRN Bobbitt, Shalon E, NP       DULoxetine  (CYMBALTA ) DR capsule 20 mg  20 mg Oral BID Bobbitt, Shalon E, NP   20 mg at 06/20/23 0842   fluticasone  furoate-vilanterol (BREO ELLIPTA ) 200-25 MCG/ACT 1 puff  1 puff Inhalation Daily Bobbitt, Shalon E, NP   1 puff at 06/20/23 0845   gabapentin  (NEURONTIN ) capsule 600 mg  600 mg Oral BID Bobbitt, Shalon E, NP   600 mg at 06/20/23 0841   hydrOXYzine  (ATARAX ) tablet 50 mg  50 mg Oral QHS Bobbitt, Shalon E, NP   50 mg at 06/19/23 2114   magnesium  hydroxide (MILK OF MAGNESIA) suspension 30 mL   30 mL Oral Daily PRN Bobbitt, Shalon E, NP       metFORMIN  (GLUCOPHAGE ) tablet 500 mg  500 mg Oral Q breakfast Bobbitt, Shalon E, NP   500 mg at 06/20/23 0841   prazosin  (MINIPRESS ) capsule 1 mg  1 mg Oral QHS Bobbitt, Shalon E, NP   1 mg at 06/19/23 2116   rosuvastatin  (CRESTOR ) tablet 20 mg  20 mg Oral Daily Bobbitt, Shalon E, NP   20 mg at 06/20/23 0842   tiZANidine  (ZANAFLEX ) tablet 4 mg  4 mg Oral Q8H PRN Bobbitt, Shalon E, NP   4 mg at 06/20/23 0841   traZODone (DESYREL) tablet 50 mg  50 mg Oral QHS PRN Bobbitt, Shalon E, NP   50 mg at 06/19/23 2117    Lab Results: No results found for this or any previous visit (from the past 48 hours).  Blood Alcohol level:  Lab Results  Component Value Date   [facility] <15 06/15/2023   ETH <10 02/10/2023    Metabolic Disorder Labs: Lab Results  Component Value Date   HGBA1C 6.6 (H) 02/03/2023   MPG 142.72 02/03/2023   No results found for: "PROLACTIN" No results found for: "CHOL", "TRIG", "HDL", "CHOLHDL", "VLDL", "LDLCALC"  Physical Findings: AIMS:  , ,  ,  ,    CIWA:    COWS:      Psychiatric Specialty Exam:  Presentation  General Appearance:  Appropriate for Environment  Eye Contact: Good  Speech: Normal Rate  Speech Volume: Normal    Mood and Affect  Mood: Depressed  Affect: Flat   Thought Process  Thought Processes: Linear  Descriptions of Associations:Intact  Orientation:Full (Time, Place and Person)  Thought Content:Rumination  Hallucinations:Hallucinations: None   Ideas of Reference:None  Suicidal Thoughts:Suicidal Thoughts: No   Homicidal Thoughts:Homicidal Thoughts: No    Sensorium  Memory: Immediate Poor  Judgment: Fair  Insight: Poor   Executive Functions  Concentration: Good  Attention Span: Fair  Recall: Good  Fund of Knowledge: Good  Language: Good   Psychomotor Activity  Psychomotor Activity: Psychomotor Activity: Normal   Musculoskeletal: Strength &  Muscle Tone: within normal limits Gait & Station: unsteady Assets  Assets: Manufacturing systems engineer; Desire for Improvement    Physical Exam: Physical Exam Vitals and nursing note reviewed.  Constitutional:      Appearance: She is obese.  HENT:     Head: Atraumatic.  Eyes:     Extraocular Movements: Extraocular movements intact.  Pulmonary:     Effort: Pulmonary effort is normal.  Neurological:     Mental Status: She is alert and oriented to person, place, and time.  Psychiatric:        Behavior: Behavior normal.    Review of Systems  Psychiatric/Behavioral:  Positive for depression, hallucinations and suicidal ideas. Negative for substance abuse. The patient is nervous/anxious. The patient does not have insomnia.    Blood pressure 126/70, pulse 73, temperature (!) 97.2 F (36.2 C), resp. rate 20, height 5\' 6"  (1.676 m), weight 107.5 kg, SpO2 94%. Body mass index is 38.25 kg/m.  Diagnosis: Principal Problem:   MDD (major depressive disorder), recurrent episode, severe (HCC) Active Problems:   Cocaine abuse, episodic (HCC)   PLAN: Safety and Monitoring:  -- Voluntary admission to inpatient psychiatric unit for safety, stabilization and treatment  -- Daily contact with patient to assess and evaluate symptoms and progress in treatment  -- Patient's case to be discussed in multi-disciplinary team meeting  -- Observation Level : q15 minute checks  -- Vital signs:  q12 hours  -- Precautions: suicide, elopement, and assault -- Encouraged patient to participate in unit milieu and in scheduled group therapies  2. Psychiatric Diagnoses and Treatment:                           MDD:             Continue Cymbalta  20 mg twice daily  Abilify 2 mg daily             PTSD rule out             Prazosin  1 mg nightly             Anxiety/insomnia             Hydroxyzine  50 mg at bedtime             gabapentin  600 mg twice daily  -- The risks/benefits/side-effects/alternatives to this  medication were discussed in detail with the patient and time was given for questions. The patient consents to medication trial.                -- Metabolic profile and EKG monitoring obtained while on an atypical antipsychotic (BMI: Lipid Panel: HbgA1c: QTc:)              -- Encouraged patient to participate in unit milieu and in scheduled group therapies        3. Medical Issues Being Addressed:   Home meds restarted. amlodipine  5 mg daily, Breo Ellipta  substituted for Symbicort , metformin  500 mg daily, rosuvastatin  20 mg daily. Consult to diabetes  coordinator is placed.   4. Discharge Planning:   -- Social work and case management to assist with discharge planning and identification of hospital follow-up needs prior to discharge  -- Estimated LOS: 3-4 days  Angelia Barcelona, NP 06/20/2023, 9:25 AM

## 2023-06-20 NOTE — Group Note (Signed)
 LCSW Group Therapy Note   Group Date: 06/20/2023 Start Time: 1300 End Time: 1400   Type of Therapy and Topic:  Group Therapy: Challenging Core Beliefs  Participation Level:  Did Not Attend  Description of Group:  Patients were educated about core beliefs and asked to identify one harmful core belief that they have. Patients were asked to explore from where those beliefs originate. Patients were asked to discuss how those beliefs make them feel and the resulting behaviors of those beliefs. They were then be asked if those beliefs are true and, if so, what evidence they have to support them. Lastly, group members were challenged to replace those negative core beliefs with helpful beliefs.   Therapeutic Goals:   1. Patient will identify harmful core beliefs and explore the origins of such beliefs. 2. Patient will identify feelings and behaviors that result from those core beliefs. 3. Patient will discuss whether such beliefs are true. 4.  Patient will replace harmful core beliefs with helpful ones.  Summary of Patient Progress:  Patient did not attend.   Therapeutic Modalities: Cognitive Behavioral Therapy; Solution-Focused Therapy   Finis Hendricksen M Kenni Newton, LCSWA 06/20/2023  1:46 PM

## 2023-06-20 NOTE — [facility] Suicide Risk Assessment (Signed)
 [facility] INPATIENT:  Family/Significant Other Suicide Prevention Education  Suicide Prevention Education:  Family/Significant Other Refusal to Support Patient after Discharge:  Suicide Prevention Education Not Provided:  Patient has identified home of family/significant other as the place the patient will be residing after discharge.  With written consent of the patient, two attempts were made to provide Suicide Prevention Education to Caitlin Rivera, daughter, [phone] , (name of family member/significant other).  This person indicates he/she will not be responsible for the patient after discharge.  Ortencia Blamer Jerrett Baldinger 06/20/2023,3:31 PM

## 2023-06-20 NOTE — Progress Notes (Signed)
 Pt calm and pleasant during assessment denying HI/AVH. Pt still endorses passive SI, verbally contracts for safety with this Clinical research associate. Pt observed by this Clinical research associate interacting appropriately with staff and peers on the unit. Pt compliant with medications. Pt given education, support, and encouragement to be active in her treatment plan. Pt being monitored Q 15 minutes for safety per unit protocol, remains safe on the unit

## 2023-06-20 NOTE — Group Note (Unsigned)
 Date:  06/20/2023 Time:  9:33 AM  Group Topic/Focus:  Goals Group:   The focus of this group is to help patients establish daily goals to achieve during treatment and discuss how the patient can incorporate goal setting into their daily lives to aide in recovery.     Participation Level:  {[facility] PARTICIPATION GEXBM:84132}  Participation Quality:  {[facility] PARTICIPATION QUALITY:22265}  Affect:  {[facility] AFFECT:22266}  Cognitive:  {[facility] COGNITIVE:22267}  Insight: {[facility] Insight2:20797}  Engagement in Group:  {[facility] ENGAGEMENT IN GMWNU:27253}  Modes of Intervention:  {[facility] MODES OF INTERVENTION:22269}  Additional Comments:  ***  Juanisha Bautch A Donae Kueker 06/20/2023, 9:33 AM

## 2023-06-20 NOTE — Group Note (Signed)
 Date:  06/20/2023 Time:  8:54 PM  Group Topic/Focus:  Wrap-Up Group:   The focus of this group is to help patients review their daily goal of treatment and discuss progress on daily workbooks.    Participation Level:  Active  Participation Quality:  Appropriate and Attentive  Affect:  Appropriate  Cognitive:  Appropriate  Insight: Appropriate  Engagement in Group:  None  Modes of Intervention:  Discussion and Orientation  Additional Comments:     Maglione,Advay Volante E 06/20/2023, 8:54 PM

## 2023-06-20 NOTE — Group Note (Signed)
 Date:  06/20/2023 Time:  6:32 PM  Group Topic/Focus:  Wellness Toolbox:   The focus of this group is to discuss various aspects of wellness, balancing those aspects and exploring ways to increase the ability to experience wellness.  Patients will create a wellness toolbox for use upon discharge.    Participation Level:  Active  Participation Quality:  Appropriate  Affect:  Appropriate  Cognitive:  Appropriate  Insight: Appropriate  Engagement in Group:  Engaged  Modes of Intervention:  Activity  Additional Comments:    Dovie Kapusta 06/20/2023, 6:32 PM

## 2023-06-20 NOTE — [facility] Counselor (Signed)
 CSW spoke with the patient's daughter, Caitlin Rivera, [phone].  She reports a belief that patient was admitted due to being "homeless".  She reports "I think she was sleeping in the park, she does that when she can't find anyone to let her in and that was around the time it was doing all that raining".   She reports that she is unsure about the patient having any access to weapons.  She reports that patient may have access due to "being on the street and maybe needing something to protect herself".  She reports that she is unsure if patient was a danger to self or others.   She reports that patient can not stay with her.  She reports that she doesn't know of any family or friends that patient can stay with due to "burned a lot of bridges".   Shasta Deist, MSW, LCSW 06/20/2023 3:36 PM

## 2023-06-20 NOTE — Group Note (Signed)
 Recreation Therapy Group Note   Group Topic:Goal Setting  Group Date: 06/20/2023 Start Time: 1005 End Time: 1055 Facilitators: Deatrice Factor, LRT, CTRS Location: Craft Room  Group Description: Product/process development scientist. Patients were given many different magazines, a glue stick, markers, and a piece of cardstock paper. LRT and pts discussed the importance of having goals in life. LRT and pts discussed the difference between short-term and long-term goals, as well as what a SMART goal is. LRT encouraged pts to create a vision board, with images they picked and then cut out with safety scissors from the magazine, for themselves, that capture their short and long-term goals. LRT encouraged pts to show and explain their vision board to the group.   Goal Area(s) Addressed:  Patient will gain knowledge of short vs. long term goals.  Patient will identify goals for themselves. Patient will practice setting SMART goals. Patient will verbalize their goals to LRT and peers.   Affect/Mood: N/A   Participation Level: Did not attend    Clinical Observations/Individualized Feedback: Patient did not attend group.   Plan: Continue to engage patient in RT group sessions 2-3x/week.   Deatrice Factor, LRT, CTRS 06/20/2023 1:18 PM

## 2023-06-20 NOTE — Plan of Care (Signed)
   Problem: Education: Goal: Knowledge of [facility] Education information/materials will improve Outcome: Progressing Goal: Emotional status will improve Outcome: Progressing Goal: Mental status will improve Outcome: Progressing

## 2023-06-21 LAB — LIPID PANEL
Cholesterol: 151 mg/dL (ref 0–200)
HDL: 45 mg/dL (ref >40)
LDL Cholesterol: 81 mg/dL (ref 0–99)
Total CHOL/HDL Ratio: 3.4 ratio
Triglycerides: 126 mg/dL (ref <150)
VLDL: 25 mg/dL (ref 0–40)

## 2023-06-21 NOTE — Plan of Care (Signed)
  Problem: Education: Goal: Knowledge of [facility] Education information/materials will improve Outcome: Progressing Goal: Emotional status will improve Outcome: Progressing Goal: Mental status will improve Outcome: Not Progressing Note: Continues to endorse AH Goal: Verbalization of understanding the information provided will improve Outcome: Progressing

## 2023-06-21 NOTE — Progress Notes (Signed)
 Caitlin Rivera  06/21/2023 4:40 PM Caitlin Rivera  MRN:  [id number]  68 year old female presented voluntarily to the ED for evaluation of depression and suicidal ideation. They are admitted to behavioral health. There is a known history of depression, DM, morbid obesity, hypercholesterolemia, hypertension, COPD, homelessness, and medication noncompliance.  Patient reports intent to overdose on her medications, though indicated that she had been not been compliant with her medications recently for over a week, citing forgetfulness and stating that the medications "don't work." She also reports being homeless for the past 2 years and feeling unsupported and hungry. During the ED and psychiatric consult, patient expressed feelings of hopelessness, helplessness, and frustration, stating I'll just go get run over by a train.  On exam today she continues to endorse feelings of hopelessness, helplessness, anhedonia, decreased energy, and decreased concentration.  She notes feelings of hopelessness and rates depression as 10 out of 10.  She also notes feelings of guilt and worry rated anxiety at 10 out of 10 as well.   Subjective:  Chart reviewed, case discussed in multidisciplinary meeting, patient seen during rounds.   Patient seen today for follow-up psychiatric evaluation.  Upon approach she is sitting in the day room along with peers engaged appropriately.  She is agreeable for interview however mood noticeably worsens with talking to this provider patient is irritable stating about [facility] and find a bed at [facility] as she reports does not have a place to live stating I wish to go at the end of the month I go to a motel room.  In review of psychiatric symptoms she reports continues to hear voices stating I am going I am here voices like this until I die I guess she denies thoughts to harm herself or anyone else however complains of continued depression and anxiety that she claims has not improved  during this inpatient stay.  06/21/23: Patient seen today for follow-up psychiatric evaluation.  Upon approach she is in bed appears to be sleeping without distress however awakes easily to verbal greeting.  She reports she is sleeping better yeah I had a better night and reports her mood is better.  This is evident in her affect as she denies depression and anxiety.  She currently denies any auditory hallucinations stating yeah they getting better.  Patient reports she has not been sleeping well she has been homeless prior to this admission stating I was living with this man until he kicked me out.  Patient reports she has no resources for housing but is interested in connecting with social work related.  We reviewed medications and she denies any side effects and there are none noted.  She denies any thoughts to harm herself or anyone else.  She reports her sleep is improved as well as her appetite.  There are no self-harm or aggressive behaviors noted.  She denies any thoughts to harm herself or anyone else.  Today she engages well in conversation her thoughts are linear and goal-directed with no paranoid or delusional content.  She has been noted on the unit interacting well with staff during mealtime earlier this morning.  Abilify 2 mg daily has been initiated to provide antipsychotic coverage while minimizing sedation and metabolic risk as well as adjunct for depression.  Although patient has endorsed auditory hallucinations she has not appear overtly psychotic or preoccupied during this stay. Medication education to include potential side effects and mechanism of action reviewed with patient who verbalizes understanding.  MDD:             - Continue Cymbalta  20 mg twice daily  - Start abilify 2 mg daily             PTSD rule out             - Prazosin  1 mg nightly             Anxiety/insomnia             - Hydroxyzine  50 mg at bedtime             - gabapentin  600 mg twice  daily    Sleep: Fair  Appetite:  Fair  Past Psychiatric History: see h&P Family History:  Family History  Problem Relation Age of Onset   Cancer Mother    Social History:  Social History   Substance and Sexual Activity  Alcohol Use No     Social History   Substance and Sexual Activity  Drug Use Yes   Types: Crack cocaine   Comment: crack-last smoked 2 wks ago-07-24-19, states she uses once a month    Social History   Socioeconomic History   Marital status: Widowed    Spouse name: Not on file   Number of children: Not on file   Years of education: Not on file   Highest education level: Not on file  Occupational History   Not on file  Tobacco Use   Smoking status: Every Day    Current packs/day: 0.50    Average packs/day: 0.5 packs/day for 35.4 years (17.7 ttl pk-yrs)    Types: Cigarettes    Start date: 46   Smokeless tobacco: Never  Substance and Sexual Activity   Alcohol use: No   Drug use: Yes    Types: Crack cocaine    Comment: crack-last smoked 2 wks ago-07-24-19, states she uses once a month   Sexual activity: Not on file  Other Topics Concern   Not on file  Social History Narrative   Not on file   Social Drivers of Health   Financial Resource Strain: Not on file  Food Insecurity: Patient Declined (06/17/2023)   Hunger Vital Sign    Worried About Running Out of Food in the Last Year: Patient declined    Ran Out of Food in the Last Year: Patient declined  Transportation Needs: Unmet Transportation Needs (06/17/2023)   PRAPARE - Administrator, Civil Service (Medical): Yes    Lack of Transportation (Non-Medical): Yes  Physical Activity: Not on file  Stress: Not on file  Social Connections: Moderately Isolated (06/17/2023)   Social Connection and Isolation Panel [NHANES]    Frequency of Communication with Friends and Family: Twice a week    Frequency of Social Gatherings with Friends and Family: Twice a week    Attends Religious  Services: 1 to 4 times per year    Active Member of [employer] or Organizations: No    Attends Banker Meetings: Never    Marital Status: Widowed   Past Medical History:  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   COPD (chronic obstructive pulmonary disease) (HCC)    smoker   Diabetes mellitus without complication (HCC)    Fibroids    Hypertension    Personal history of radiation therapy    Smoker     Past Surgical History:  Procedure Laterality Date  BREAST BIOPSY Bilateral 06/03/2019   BREAST BIOPSY Left 06/14/2019   BREAST EXCISIONAL BIOPSY Right 07/2019   BREAST LUMPECTOMY Left 07/2019   BREAST LUMPECTOMY WITH RADIOACTIVE SEED LOCALIZATION Bilateral 07/31/2019   Procedure: BILATERAL BREAST LUMPECTOMY WITH RADIOACTIVE SEED LOCALIZATION;  Surgeon: Caralyn Chandler, MD;  Location: [facility];  Service: General;  Laterality: Bilateral;   TUBAL LIGATION     TUBAL LIGATION      Current Medications: Current Facility-Administered Medications  Medication Dose Route Frequency Provider Last Rate Last Admin   acetaminophen  (TYLENOL ) tablet 650 mg  650 mg Oral Q6H PRN Bobbitt, Shalon E, NP       albuterol  (VENTOLIN  HFA) 108 (90 Base) MCG/ACT inhaler 1-2 puff  1-2 puff Inhalation Q6H PRN Bobbitt, Shalon E, NP   2 puff at 06/21/23 0833   alum & mag hydroxide-simeth (MAALOX/MYLANTA) 200-200-20 MG/5ML suspension 30 mL  30 mL Oral Q4H PRN Bobbitt, Shalon E, NP       amLODipine  (NORVASC ) tablet 5 mg  5 mg Oral Daily Bobbitt, Shalon E, NP   5 mg at 06/21/23 3790   ARIPiprazole (ABILIFY) tablet 2 mg  2 mg Oral Daily Millington, Matthew E, PA-C   2 mg at 06/21/23 2409   haloperidol (HALDOL) tablet 5 mg  5 mg Oral TID PRN Bobbitt, Shalon E, NP       And   diphenhydrAMINE  (BENADRYL ) capsule 50 mg  50 mg Oral TID PRN Bobbitt, Shalon E, NP       haloperidol lactate (HALDOL) injection 5 mg  5 mg Intramuscular TID PRN Bobbitt,  Shalon E, NP       And   diphenhydrAMINE  (BENADRYL ) injection 50 mg  50 mg Intramuscular TID PRN Bobbitt, Shalon E, NP       And   LORazepam (ATIVAN) injection 2 mg  2 mg Intramuscular TID PRN Bobbitt, Shalon E, NP       DULoxetine  (CYMBALTA ) DR capsule 20 mg  20 mg Oral BID Bobbitt, Shalon E, NP   20 mg at 06/21/23 0831   fluticasone  furoate-vilanterol (BREO ELLIPTA ) 200-25 MCG/ACT 1 puff  1 puff Inhalation Daily Bobbitt, Shalon E, NP   1 puff at 06/21/23 0832   gabapentin  (NEURONTIN ) capsule 600 mg  600 mg Oral BID Bobbitt, Shalon E, NP   600 mg at 06/21/23 0831   hydrOXYzine  (ATARAX ) tablet 50 mg  50 mg Oral QHS Bobbitt, Shalon E, NP   50 mg at 06/20/23 2125   magnesium  hydroxide (MILK OF MAGNESIA) suspension 30 mL  30 mL Oral Daily PRN Bobbitt, Shalon E, NP       metFORMIN  (GLUCOPHAGE ) tablet 500 mg  500 mg Oral Q breakfast Bobbitt, Shalon E, NP   500 mg at 06/21/23 7353   prazosin  (MINIPRESS ) capsule 1 mg  1 mg Oral QHS Bobbitt, Shalon E, NP   1 mg at 06/20/23 2120   rosuvastatin  (CRESTOR ) tablet 20 mg  20 mg Oral Daily Bobbitt, Shalon E, NP   20 mg at 06/21/23 0832   tiZANidine  (ZANAFLEX ) tablet 4 mg  4 mg Oral Q8H PRN Bobbitt, Shalon E, NP   4 mg at 06/21/23 0832   traZODone (DESYREL) tablet 50 mg  50 mg Oral QHS PRN Bobbitt, Shalon E, NP   50 mg at 06/20/23 2128    Lab Results:  Results for orders placed or performed during the hospital encounter of 06/17/23 (from the past 48 hours)  Lipid panel     Status: None  Collection Time: 06/21/23  6:22 AM  Result Value Ref Range   Cholesterol 151 0 - 200 mg/dL   Triglycerides 161 <096 mg/dL   HDL 45 >04 mg/dL   Total CHOL/HDL Ratio 3.4 RATIO   VLDL 25 0 - 40 mg/dL   LDL Cholesterol 81 0 - 99 mg/dL    Comment:        Total Cholesterol/HDL:CHD Risk Coronary Heart Disease Risk Table                     Men   Women  1/2 Average Risk   3.4   3.3  Average Risk       5.0   4.4  2 X Average Risk   9.6   7.1  3 X Average Risk  23.4    11.0        Use the calculated Patient Ratio above and the CHD Risk Table to determine the patient's CHD Risk.        ATP III CLASSIFICATION (LDL):  <100     mg/dL   Optimal  540-981  mg/dL   Near or Above                    Optimal  130-159  mg/dL   Borderline  191-478  mg/dL   High  >295     mg/dL   Very High Performed at [facility], [address] [place], [place] [address]     Blood Alcohol level:  Lab Results  Component Value Date   [facility] <15 06/15/2023   ETH <10 02/10/2023    Metabolic Disorder Labs: Lab Results  Component Value Date   HGBA1C 6.6 (H) 02/03/2023   MPG 142.72 02/03/2023   No results found for: PROLACTIN Lab Results  Component Value Date   CHOL 151 06/21/2023   TRIG 126 06/21/2023   HDL 45 06/21/2023   CHOLHDL 3.4 06/21/2023   VLDL 25 06/21/2023   LDLCALC 81 06/21/2023    Psychiatric Specialty Exam:  Presentation  General Appearance:  Appropriate for Environment  Eye Contact: Good  Speech: Normal Rate  Speech Volume: Normal    Mood and Affect  Mood: Depressed  Affect: Flat   Thought Process  Thought Processes: Linear  Descriptions of Associations:Intact  Orientation:Full (Time, Place and Person)  Thought Content:Rumination  Hallucinations:Hallucinations: None   Ideas of Reference:None  Suicidal Thoughts:Suicidal Thoughts: No   Homicidal Thoughts:Homicidal Thoughts: No    Sensorium  Memory: Immediate Poor  Judgment: Fair  Insight: Poor   Executive Functions  Concentration: Good  Attention Span: Fair  Recall: Good  Fund of Knowledge: Good  Language: Good   Psychomotor Activity  Psychomotor Activity: Psychomotor Activity: Normal   Musculoskeletal: Strength & Muscle Tone: within normal limits Gait & Station: unsteady Assets  Assets: Manufacturing systems engineer; Desire for Improvement    Physical Exam: Physical Exam Vitals and nursing Rivera reviewed.   Constitutional:      Appearance: She is obese.  HENT:     Head: Atraumatic.  Eyes:     Extraocular Movements: Extraocular movements intact.  Pulmonary:     Effort: Pulmonary effort is normal.  Neurological:     Mental Status: She is alert and oriented to person, place, and time.  Psychiatric:        Behavior: Behavior normal.    Review of Systems  Psychiatric/Behavioral:  Positive for depression, hallucinations and suicidal ideas. Negative for substance abuse. The patient is nervous/anxious. The  patient does not have insomnia.    Blood pressure (!) 120/57, pulse 69, temperature 98.3 F (36.8 C), resp. rate 18, height 5' 6 (1.676 m), weight 107.5 kg, SpO2 94%. Body mass index is 38.25 kg/m.  Diagnosis: Principal Problem:   MDD (major depressive disorder), recurrent episode, severe (HCC) Active Problems:   Cocaine abuse, episodic (HCC)   PLAN: Safety and Monitoring:  -- Voluntary admission to inpatient psychiatric unit for safety, stabilization and treatment  -- Daily contact with patient to assess and evaluate symptoms and progress in treatment  -- Patient's case to be discussed in multi-disciplinary team meeting  -- Observation Level : q15 minute checks  -- Vital signs:  q12 hours  -- Precautions: suicide, elopement, and assault -- Encouraged patient to participate in unit milieu and in scheduled group therapies  2. Psychiatric Diagnoses and Treatment:                           MDD:             Continue Cymbalta  20 mg twice daily  Abilify 2 mg daily             PTSD rule out             Prazosin  1 mg nightly             Anxiety/insomnia             Hydroxyzine  50 mg at bedtime             gabapentin  600 mg twice daily  -- The risks/benefits/side-effects/alternatives to this medication were discussed in detail with the patient and time was given for questions. The patient consents to medication trial.                -- Metabolic profile and EKG monitoring obtained  while on an atypical antipsychotic (BMI: Lipid Panel: HbgA1c: QTc:)              -- Encouraged patient to participate in unit milieu and in scheduled group therapies        3. Medical Issues Being Addressed:   Home meds restarted. amlodipine  5 mg daily, Breo Ellipta  substituted for Symbicort , metformin  500 mg daily, rosuvastatin  20 mg daily. Consult to diabetes coordinator is placed.   4. Discharge Planning:   -- Social work and case management to assist with discharge planning and identification of hospital follow-up needs prior to discharge  -- Estimated LOS: 3-4 days  Angelia Barcelona, NP 06/21/2023, 4:40 PM

## 2023-06-21 NOTE — Group Note (Signed)
 [facility] LCSW Group Therapy Note   Group Date: 06/21/2023 Start Time: 1300 End Time: 1400   Type of Therapy/Topic:  Group Therapy:  Emotion Regulation  Participation Level:  None   Mood:  Description of Group:    The purpose of this group is to assist patients in learning to regulate negative emotions and experience positive emotions. Patients will be guided to discuss ways in which they have been vulnerable to their negative emotions. These vulnerabilities will be juxtaposed with experiences of positive emotions or situations, and patients challenged to use positive emotions to combat negative ones. Special emphasis will be placed on coping with negative emotions in conflict situations, and patients will process healthy conflict resolution skills.  Therapeutic Goals: Patient will identify two positive emotions or experiences to reflect on in order to balance out negative emotions:  Patient will label two or more emotions that they find the most difficult to experience:  Patient will be able to demonstrate positive conflict resolution skills through discussion or role plays:   Summary of Patient Progress: Patient was present in group, however, did not participate in group discussion.  Patient appeared asleep throughout group.   Therapeutic Modalities:   Cognitive Behavioral Therapy Feelings Identification Dialectical Behavioral Therapy   [name], LCSW

## 2023-06-21 NOTE — Progress Notes (Signed)
   06/21/23 2231  Psychosocial Assessment  Eye Contact Fair  Facial Expression Worried  Affect Appropriate to circumstance  Speech Logical/coherent  Interaction Assertive  Motor Activity Slow  Appearance/Hygiene In scrubs  Aggressive Behavior  Effect No apparent injury  Thought Process  Coherency WDL  Content WDL  Delusions None reported or observed  Perception WDL  Hallucination Auditory  Judgment Impaired  Confusion None  Danger to Self  Current suicidal ideation? Passive  Agreement Not to Harm Self Yes  Description of Agreement verbal  Danger to Others  Danger to Others None reported or observed

## 2023-06-21 NOTE — Group Note (Signed)
 Date:  06/21/2023 Time:  10:00 PM  Group Topic/Focus:  Wellness Toolbox:   The focus of this group is to discuss various aspects of wellness, balancing those aspects and exploring ways to increase the ability to experience wellness.  Patients will create a wellness toolbox for use upon discharge.    Participation Level:  Active  Participation Quality:  Appropriate  Affect:  Appropriate  Cognitive:  Appropriate  Insight: Appropriate  Engagement in Group:  Engaged  Modes of Intervention:  Discussion  Additional Comments:     Fabiola Holy 06/21/2023, 10:00 PM

## 2023-06-21 NOTE — [facility] Counselor (Signed)
 CSW spoke with the patient on the patient's plan for assistance. Pt reports that she needs housing to no longer be suicidal.  CSW discussed with the patient [address] [facility] [address] in [place] and [place] in [place].  All are homeless shelters. Patient reports that she can not go to [employer] and is not open to going to any other county besides [address].  She reports that she would rather go back to the street or the park.  She reports that she was staying at [facility] in [place]. She reports that she is scared of going to places that she is not familiar with.    CSW pointed out to patient that choices seem skewed if patient is homeless and declining options.  CSW provided patient with empathy.  CSW spoke with the patient on substance use.  Patient denied using substances. CSW notes that patients UDS was positive for cocaine.  Patient later admitted I use but it not like it controls my life.  Pt was not open to a referral for substance use treatment.  CSW discussed [facility] and [facility] with patient as well as [employer]. Patient was not open to these options either.  CSW provided patient with more psychoeducation and patient began to cry and stated do what you got to do.   CSW asked patient if she is on the housing authority waitlist and patient reported that she has not completed her application.  She reports that she is not on the list.  CSW encouraged patient to complete the application as the waitlist is long.  CSW to see if application is online and if CSW can sent to the housing authority for the patient.    Patient appeared to reluctantly agree for CSW to reach out to [employer], [facility] and [facility] for bed availability.  Pt reluctantly agreed to [facility] information.   Patient reports that she has $700 income.  CSW to look if there is a list of rooms for patient to rent.  Patient repots that her insurance  company is assisting with housing but only after pt maintains a PCP appointment on the 26th of June 2025.  CSW discussed with the patient that she may have to discharge to the [facility] if more stable housing is not obtained.   Shasta Deist, MSW, LCSW 06/21/2023 4:11 PM

## 2023-06-21 NOTE — Group Note (Signed)
 Date:  06/21/2023 Time:  10:40 AM  Group Topic/Focus:  Emotional Education:   The focus of this group is to discuss what feelings/emotions are, and how they are experienced. Healthy Communication:   The focus of this group is to discuss communication, barriers to communication, as well as healthy ways to communicate with others. Personal Choices and Values:   The focus of this group is to help patients assess and explore the importance of values in their lives, how their values affect their decisions, how they express their values and what opposes their expression.    Participation Level:  Active  Participation Quality:  Appropriate  Affect:  Appropriate  Cognitive:  Appropriate  Insight: Appropriate  Engagement in Group:  Engaged  Modes of Intervention:  Activity  Additional Comments:    Leslyn Monda 06/21/2023, 10:40 AM

## 2023-06-21 NOTE — Plan of Care (Signed)
 Caitlin Rivera is a 68 y.o. female patient. No diagnosis found. 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   COPD (chronic obstructive pulmonary disease) (HCC)    smoker   Diabetes mellitus without complication (HCC)    Fibroids    Hypertension    Personal history of radiation therapy    Smoker    Current Facility-Administered Medications  Medication Dose Route Frequency Provider Last Rate Last Admin   acetaminophen  (TYLENOL ) tablet 650 mg  650 mg Oral Q6H PRN Bobbitt, Shalon E, NP       albuterol  (VENTOLIN  HFA) 108 (90 Base) MCG/ACT inhaler 1-2 puff  1-2 puff Inhalation Q6H PRN Bobbitt, Shalon E, NP   2 puff at 06/20/23 2125   alum & mag hydroxide-simeth (MAALOX/MYLANTA) 200-200-20 MG/5ML suspension 30 mL  30 mL Oral Q4H PRN Bobbitt, Shalon E, NP       amLODipine  (NORVASC ) tablet 5 mg  5 mg Oral Daily Bobbitt, Shalon E, NP   5 mg at 06/20/23 0841   ARIPiprazole (ABILIFY) tablet 2 mg  2 mg Oral Daily Millington, Matthew E, PA-C   2 mg at 06/20/23 7564   haloperidol (HALDOL) tablet 5 mg  5 mg Oral TID PRN Bobbitt, Shalon E, NP       And   diphenhydrAMINE  (BENADRYL ) capsule 50 mg  50 mg Oral TID PRN Bobbitt, Shalon E, NP       haloperidol lactate (HALDOL) injection 5 mg  5 mg Intramuscular TID PRN Bobbitt, Shalon E, NP       And   diphenhydrAMINE  (BENADRYL ) injection 50 mg  50 mg Intramuscular TID PRN Bobbitt, Shalon E, NP       And   LORazepam (ATIVAN) injection 2 mg  2 mg Intramuscular TID PRN Bobbitt, Shalon E, NP       DULoxetine  (CYMBALTA ) DR capsule 20 mg  20 mg Oral BID Bobbitt, Shalon E, NP   20 mg at 06/20/23 1716   fluticasone  furoate-vilanterol (BREO ELLIPTA ) 200-25 MCG/ACT 1 puff  1 puff Inhalation Daily Bobbitt, Shalon E, NP   1 puff at 06/20/23 0845   gabapentin  (NEURONTIN ) capsule 600 mg  600 mg Oral BID Bobbitt, Shalon E, NP   600 mg at 06/20/23 1716   hydrOXYzine  (ATARAX ) tablet 50 mg  50 mg Oral QHS Bobbitt, Shalon E,  NP   50 mg at 06/20/23 2125   magnesium  hydroxide (MILK OF MAGNESIA) suspension 30 mL  30 mL Oral Daily PRN Bobbitt, Shalon E, NP       metFORMIN  (GLUCOPHAGE ) tablet 500 mg  500 mg Oral Q breakfast Bobbitt, Shalon E, NP   500 mg at 06/20/23 0841   prazosin  (MINIPRESS ) capsule 1 mg  1 mg Oral QHS Bobbitt, Shalon E, NP   1 mg at 06/20/23 2120   rosuvastatin  (CRESTOR ) tablet 20 mg  20 mg Oral Daily Bobbitt, Shalon E, NP   20 mg at 06/20/23 3329   tiZANidine  (ZANAFLEX ) tablet 4 mg  4 mg Oral Q8H PRN Bobbitt, Shalon E, NP   4 mg at 06/20/23 0841   traZODone (DESYREL) tablet 50 mg  50 mg Oral QHS PRN Bobbitt, Shalon E, NP   50 mg at 06/20/23 2128   Allergies  Allergen Reactions   Ibuprofen Itching   Morphine  And Codeine Itching   Principal Problem:   MDD (major depressive disorder), recurrent episode, severe (HCC) Active Problems:   Cocaine abuse, episodic (  HCC)  Blood pressure (!) 120/57, pulse 69, temperature 98.3 F (36.8 C), resp. rate 18, height 5' 6 (1.676 m), weight 107.5 kg, SpO2 94%.  Patient was pleasant, took meds, Denies SI / HI this shift.  Caitlin Rivera 06/21/2023

## 2023-06-21 NOTE — Progress Notes (Signed)
   06/21/23 0830  Psych Admission Type (Psych Patients Only)  Admission Status Voluntary  Psychosocial Assessment  Patient Complaints Anxiety  Eye Contact Fair  Facial Expression Worried (patient stated that this is the month of decesed husband's death)  Affect Appropriate to circumstance  Speech Logical/coherent  Interaction Assertive  Motor Activity Slow  Appearance/Hygiene In scrubs  Behavior Characteristics Anxious  Mood Pleasant  Aggressive Behavior  Targets Self  Effect No apparent injury  Thought Process  Coherency WDL  Content WDL  Delusions None reported or observed  Perception WDL  Hallucination Auditory (not negative; conversations with dead loved ones)  Judgment Impaired  Confusion None  Danger to Self  Current suicidal ideation? Passive  Description of Suicide Plan would not devulge if she has a plan  Self-Injurious Behavior No self-injurious ideation or behavior indicators observed or expressed   Agreement Not to Harm Self Yes  Description of Agreement Verbal  Danger to Others  Danger to Others None reported or observed

## 2023-06-21 NOTE — Plan of Care (Signed)
 Patient stated that her her anxiety and depression getting little better. No PRN needed for anxiety today. Patient visible in the milieu. ADLs maintained. Support and encouragement given.

## 2023-06-21 NOTE — Group Note (Signed)
 Date:  06/21/2023 Time:  6:37 PM  Group Topic/Focus:  Activity Group: The focus of the group is to promote activity for the patients and to encourage them to go outside to the courtyard and get some fresh air and some exercise.    Participation Level:  Did Not Attend   Marianna Shirk Broxton Broady 06/21/2023, 6:37 PM

## 2023-06-22 NOTE — Group Note (Signed)
 Date:  06/22/2023 Time:  10:15 AM  Group Topic/Focus:  Goals Group:   The focus of this group is to help patients establish daily goals to achieve during treatment and discuss how the patient can incorporate goal setting into their daily lives to aide in recovery.    Participation Level:  Active  Participation Quality:  Appropriate  Affect:  Appropriate  Cognitive:  Appropriate  Insight: Appropriate  Engagement in Group:  Engaged  Modes of Intervention:  Discussion, Education, and Support  Additional Comments:    Laverne Potter 06/22/2023, 10:15 AM

## 2023-06-22 NOTE — Group Note (Signed)
 [facility] LCSW Group Therapy Note   Group Date: 06/22/2023 Start Time: 1300 End Time: 1330  Type of Therapy and Topic:  Group Therapy:  Feelings around Relapse and Recovery  Participation Level:  None   Mood:  Description of Group:    Patients in this group will discuss emotions they experience before and after a relapse. They will process how experiencing these feelings, or avoidance of experiencing them, relates to having a relapse. Facilitator will guide patients to explore emotions they have related to recovery. Patients will be encouraged to process which emotions are more powerful. They will be guided to discuss the emotional reaction significant others in their lives may have to patients' relapse or recovery. Patients will be assisted in exploring ways to respond to the emotions of others without this contributing to a relapse.  Therapeutic Goals: Patient will identify two or more emotions that lead to relapse for them:  Patient will identify two emotions that result when they relapse:  Patient will identify two emotions related to recovery:  Patient will demonstrate ability to communicate their needs through discussion and/or role plays.   Summary of Patient Progress: Patient was present for the entirety of the group process. She participated in the icebreaker but not in the larger group discussion.    Therapeutic Modalities:   Cognitive Behavioral Therapy Solution-Focused Therapy Assertiveness Training Relapse Prevention Therapy   Randolm Butte, LCSW

## 2023-06-22 NOTE — Plan of Care (Signed)

## 2023-06-22 NOTE — Group Note (Signed)
 Date:  06/22/2023 Time:  9:02 PM  Group Topic/Focus:  Identifying Needs:   The focus of this group is to help patients identify their personal needs that have been historically problematic and identify healthy behaviors to address their needs.  MHT made introduction and discussed expections for the night. MHT explained there were 15 minute checks and techs would be as quite as possible, not to disturb anyone's sleep. MHT explained 15 minute checks were required so do not be alarmed if they wake up and someone is looking into the room. MHT explained the 15 minute check was to ensure they were breathing and some rooms are dark, so it takes a minute for eyes to adjust to see breathing. MHT reassured patients that would be the reason for a prolonged look in their rooms.  MHT discussed resources available upon discharge. MHT informed of the peer living room located at [facility]. MHT explained this was a free service available to all members in the community. MHT informed the peer living room was a place they could access the internet, get help completing applications, looking for housing, or other resources in the community. MHT reminded it was a free service and someone was always there to assist.  MHT informed of the 211 call line for resources. MHT offered to call to show how to utilize the line, but patients stated they were familiar with. MHT informed if they were looking for resources such as food, assistance with housing, medical care, etc they could call the 211 resource line. MHT explained they would provide the caller with resources available within a 100 mile radius.  MHT opened floor for discussion and answered questions as needed.  Participation Level:  Did Not Attend  Participation Quality:  Did Not Attend  Affect:  Did Not Attend  Cognitive:  Did Not Attend  Insight: Did Not Attend  Engagement in Group:  Did Not Attend  Modes of Intervention:  Did Not Attend  Additional Comments:  Did  Not Attend  Titus Formosa 06/22/2023, 9:02 PM

## 2023-06-22 NOTE — Progress Notes (Signed)
   06/22/23 0949  Psych Admission Type (Psych Patients Only)  Admission Status Voluntary  Psychosocial Assessment  Patient Complaints Sleep disturbance;Sadness;Depression;Anxiety  Eye Contact Fair  Facial Expression Sad;Animated  Affect Appropriate to circumstance  Speech Logical/coherent  Interaction Assertive  Motor Activity Slow;Unsteady  Appearance/Hygiene Unremarkable;In scrubs  Behavior Characteristics Cooperative  Mood Sad;Pleasant (Patient reports her goal for today is to get better work on what's wrong. She writes she will pray on it to help her reach her goal and states she is working on being more positive.)  Thought Process  Coherency WDL  Content WDL  Delusions None reported or observed  Perception Hallucinations  Hallucination Auditory;Visual (Patient reports she was seeing flies last night.)  Judgment Impaired  Confusion None  Danger to Self  Current suicidal ideation? Denies (Patient states not right now.)  Self-Injurious Behavior No self-injurious ideation or behavior indicators observed or expressed   Agreement Not to Harm Self Yes  Description of Agreement Verbal  Danger to Others  Danger to Others None reported or observed

## 2023-06-22 NOTE — Progress Notes (Signed)
 [facility] MD Progress Note  06/22/2023 1:02 PM Caitlin Rivera  MRN:  [id number]  68 year old female presented voluntarily to the ED for evaluation of depression and suicidal ideation. They are admitted to behavioral health. There is a known history of depression, DM, morbid obesity, hypercholesterolemia, hypertension, COPD, homelessness, and medication noncompliance.  Patient reports intent to overdose on her medications, though indicated that she had been not been compliant with her medications recently for over a week, citing forgetfulness and stating that the medications "don't work." She also reports being homeless for the past 2 years and feeling unsupported and hungry. During the ED and psychiatric consult, patient expressed feelings of hopelessness, helplessness, and frustration, stating I'll just go get run over by a train.  On exam today she continues to endorse feelings of hopelessness, helplessness, anhedonia, decreased energy, and decreased concentration.  She notes feelings of hopelessness and rates depression as 10 out of 10.  She also notes feelings of guilt and worry rated anxiety at 10 out of 10 as well.   Subjective:  Chart reviewed, case discussed in multidisciplinary meeting, patient seen during rounds.   Patient seen today for follow-up psychiatric evaluation.  She reports I'm doing Ok I actually slept better last night, that makes a big difference In review of psychiatric symptoms she denies SI/HI, denies AVH paranoia and delusions. There are no self-harm or aggressive behaviors noted.  She denies any thoughts to harm herself or anyone else.  Today she engages well in conversation her thoughts are linear and goal-directed with no paranoid or delusional content. No changes in plan of care indicated today. Pt continues to require safe discharge disposition.                 MDD:             - Continue Cymbalta  20 mg twice daily  - Start abilify 2 mg daily             PTSD rule out              - Prazosin  1 mg nightly             Anxiety/insomnia             - Hydroxyzine  50 mg at bedtime             - gabapentin  600 mg twice daily    Sleep: Fair-improved  Appetite:  good  Past Psychiatric History: see h&P Family History:  Family History  Problem Relation Age of Onset   Cancer Mother    Social History:  Social History   Substance and Sexual Activity  Alcohol Use No     Social History   Substance and Sexual Activity  Drug Use Yes   Types: Crack cocaine   Comment: crack-last smoked 2 wks ago-07-24-19, states she uses once a month    Social History   Socioeconomic History   Marital status: Widowed    Spouse name: Not on file   Number of children: Not on file   Years of education: Not on file   Highest education level: Not on file  Occupational History   Not on file  Tobacco Use   Smoking status: Every Day    Current packs/day: 0.50    Average packs/day: 0.5 packs/day for 35.4 years (17.7 ttl pk-yrs)    Types: Cigarettes    Start date: 57   Smokeless tobacco: Never  Substance and Sexual Activity   Alcohol use: No  Drug use: Yes    Types: Crack cocaine    Comment: crack-last smoked 2 wks ago-07-24-19, states she uses once a month   Sexual activity: Not on file  Other Topics Concern   Not on file  Social History Narrative   Not on file   Social Drivers of Health   Financial Resource Strain: Not on file  Food Insecurity: Patient Declined (06/17/2023)   Hunger Vital Sign    Worried About Running Out of Food in the Last Year: Patient declined    Ran Out of Food in the Last Year: Patient declined  Transportation Needs: Unmet Transportation Needs (06/17/2023)   PRAPARE - Administrator, Civil Service (Medical): Yes    Lack of Transportation (Non-Medical): Yes  Physical Activity: Not on file  Stress: Not on file  Social Connections: Moderately Isolated (06/17/2023)   Social Connection and Isolation Panel    Frequency of  Communication with Friends and Family: Twice a week    Frequency of Social Gatherings with Friends and Family: Twice a week    Attends Religious Services: 1 to 4 times per year    Active Member of [employer] or Organizations: No    Attends Banker Meetings: Never    Marital Status: Widowed   Past Medical History:  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   COPD (chronic obstructive pulmonary disease) (HCC)    smoker   Diabetes mellitus without complication (HCC)    Fibroids    Hypertension    Personal history of radiation therapy    Smoker     Past Surgical History:  Procedure Laterality Date   BREAST BIOPSY Bilateral 06/03/2019   BREAST BIOPSY Left 06/14/2019   BREAST EXCISIONAL BIOPSY Right 07/2019   BREAST LUMPECTOMY Left 07/2019   BREAST LUMPECTOMY WITH RADIOACTIVE SEED LOCALIZATION Bilateral 07/31/2019   Procedure: BILATERAL BREAST LUMPECTOMY WITH RADIOACTIVE SEED LOCALIZATION;  Surgeon: Caralyn Chandler, MD;  Location: [facility];  Service: General;  Laterality: Bilateral;   TUBAL LIGATION     TUBAL LIGATION      Current Medications: Current Facility-Administered Medications  Medication Dose Route Frequency Provider Last Rate Last Admin   acetaminophen  (TYLENOL ) tablet 650 mg  650 mg Oral Q6H PRN Bobbitt, Shalon E, NP   650 mg at 06/22/23 0823   albuterol  (VENTOLIN  HFA) 108 (90 Base) MCG/ACT inhaler 1-2 puff  1-2 puff Inhalation Q6H PRN Bobbitt, Shalon E, NP   2 puff at 06/21/23 0833   alum & mag hydroxide-simeth (MAALOX/MYLANTA) 200-200-20 MG/5ML suspension 30 mL  30 mL Oral Q4H PRN Bobbitt, Shalon E, NP       amLODipine  (NORVASC ) tablet 5 mg  5 mg Oral Daily Bobbitt, Shalon E, NP   5 mg at 06/22/23 1610   ARIPiprazole (ABILIFY) tablet 2 mg  2 mg Oral Daily Millington, Matthew E, PA-C   2 mg at 06/22/23 0823   haloperidol (HALDOL) tablet 5 mg  5 mg Oral TID PRN Bobbitt, Shalon E, NP       And    diphenhydrAMINE  (BENADRYL ) capsule 50 mg  50 mg Oral TID PRN Bobbitt, Shalon E, NP       haloperidol lactate (HALDOL) injection 5 mg  5 mg Intramuscular TID PRN Bobbitt, Shalon E, NP       And   diphenhydrAMINE  (BENADRYL ) injection 50 mg  50 mg Intramuscular TID PRN Bobbitt, Shalon E,  NP       And   LORazepam (ATIVAN) injection 2 mg  2 mg Intramuscular TID PRN Bobbitt, Shalon E, NP       DULoxetine  (CYMBALTA ) DR capsule 20 mg  20 mg Oral BID Bobbitt, Shalon E, NP   20 mg at 06/22/23 1610   fluticasone  furoate-vilanterol (BREO ELLIPTA ) 200-25 MCG/ACT 1 puff  1 puff Inhalation Daily Bobbitt, Shalon E, NP   1 puff at 06/22/23 0824   gabapentin  (NEURONTIN ) capsule 600 mg  600 mg Oral BID Bobbitt, Shalon E, NP   600 mg at 06/22/23 9604   hydrOXYzine  (ATARAX ) tablet 50 mg  50 mg Oral QHS Bobbitt, Shalon E, NP   50 mg at 06/21/23 2131   magnesium  hydroxide (MILK OF MAGNESIA) suspension 30 mL  30 mL Oral Daily PRN Bobbitt, Shalon E, NP       metFORMIN  (GLUCOPHAGE ) tablet 500 mg  500 mg Oral Q breakfast Bobbitt, Shalon E, NP   500 mg at 06/22/23 5409   prazosin  (MINIPRESS ) capsule 1 mg  1 mg Oral QHS Bobbitt, Shalon E, NP   1 mg at 06/21/23 2131   rosuvastatin  (CRESTOR ) tablet 20 mg  20 mg Oral Daily Bobbitt, Shalon E, NP   20 mg at 06/22/23 8119   tiZANidine  (ZANAFLEX ) tablet 4 mg  4 mg Oral Q8H PRN Bobbitt, Shalon E, NP   4 mg at 06/21/23 2131   traZODone (DESYREL) tablet 50 mg  50 mg Oral QHS PRN Bobbitt, Shalon E, NP   50 mg at 06/21/23 2131    Lab Results:  Results for orders placed or performed during the hospital encounter of 06/17/23 (from the past 48 hours)  Lipid panel     Status: None   Collection Time: 06/21/23  6:22 AM  Result Value Ref Range   Cholesterol 151 0 - 200 mg/dL   Triglycerides 147 <829 mg/dL   HDL 45 >56 mg/dL   Total CHOL/HDL Ratio 3.4 RATIO   VLDL 25 0 - 40 mg/dL   LDL Cholesterol 81 0 - 99 mg/dL    Comment:        Total Cholesterol/HDL:CHD Risk Coronary Heart  Disease Risk Table                     Men   Women  1/2 Average Risk   3.4   3.3  Average Risk       5.0   4.4  2 X Average Risk   9.6   7.1  3 X Average Risk  23.4   11.0        Use the calculated Patient Ratio above and the CHD Risk Table to determine the patient's CHD Risk.        ATP III CLASSIFICATION (LDL):  <100     mg/dL   Optimal  213-086  mg/dL   Near or Above                    Optimal  130-159  mg/dL   Borderline  578-469  mg/dL   High  >629     mg/dL   Very High Performed at [facility], [address] [place], [place] [address]     Blood Alcohol level:  Lab Results  Component Value Date   [facility] <15 06/15/2023   ETH <10 02/10/2023    Metabolic Disorder Labs: Lab Results  Component Value Date   HGBA1C 6.6 (H) 02/03/2023   MPG  142.72 02/03/2023   No results found for: PROLACTIN Lab Results  Component Value Date   CHOL 151 06/21/2023   TRIG 126 06/21/2023   HDL 45 06/21/2023   CHOLHDL 3.4 06/21/2023   VLDL 25 06/21/2023   LDLCALC 81 06/21/2023    Psychiatric Specialty Exam:  Presentation  General Appearance:  Appropriate for Environment  Eye Contact: Good  Speech: Normal Rate  Speech Volume: Normal    Mood and Affect  Mood: Depressed  Affect: Flat   Thought Process  Thought Processes: Linear  Descriptions of Associations:Intact  Orientation:Full (Time, Place and Person)  Thought Content:reality based  Hallucinations:No data recorded   Ideas of Reference:None  Suicidal Thoughts:No data recorded   Homicidal Thoughts:No data recorded    Sensorium  Memory: Immediate Poor  Judgment: Fair  Insight: Poor   Executive Functions  Concentration: Good  Attention Span: Fair  Recall: Good  Fund of Knowledge: Good  Language: Good   Psychomotor Activity  Psychomotor Activity: No data recorded   Musculoskeletal: Strength & Muscle Tone: within normal limits Gait & Station:  unsteady Assets  Assets: Manufacturing systems engineer; Desire for Improvement    Physical Exam: Physical Exam Vitals and nursing note reviewed.  Constitutional:      Appearance: She is obese.  HENT:     Head: Atraumatic.   Eyes:     Extraocular Movements: Extraocular movements intact.   Pulmonary:     Effort: Pulmonary effort is normal.   Neurological:     Mental Status: She is alert and oriented to person, place, and time.   Psychiatric:        Behavior: Behavior normal.    Review of Systems  Psychiatric/Behavioral:  Positive for depression, hallucinations and suicidal ideas. Negative for substance abuse. The patient is nervous/anxious. The patient does not have insomnia.    Blood pressure (!) 108/49, pulse 74, temperature (!) 97.1 F (36.2 C), resp. rate 20, height 5' 6 (1.676 m), weight 107.5 kg, SpO2 96%. Body mass index is 38.25 kg/m.  Diagnosis: Principal Problem:   MDD (major depressive disorder), recurrent episode, severe (HCC) Active Problems:   Cocaine abuse, episodic (HCC)   PLAN: Safety and Monitoring:  -- Voluntary admission to inpatient psychiatric unit for safety, stabilization and treatment  -- Daily contact with patient to assess and evaluate symptoms and progress in treatment  -- Patient's case to be discussed in multi-disciplinary team meeting  -- Observation Level : q15 minute checks  -- Vital signs:  q12 hours  -- Precautions: suicide, elopement, and assault -- Encouraged patient to participate in unit milieu and in scheduled group therapies  2. Psychiatric Diagnoses and Treatment:                           MDD:             Continue Cymbalta  20 mg twice daily  Abilify 2 mg daily             PTSD rule out             Prazosin  1 mg nightly             Anxiety/insomnia             Hydroxyzine  50 mg at bedtime             gabapentin  600 mg twice daily  -- The risks/benefits/side-effects/alternatives to this medication were discussed in detail  with the patient and time was  given for questions. The patient consents to medication trial.                -- Metabolic profile and EKG monitoring obtained while on an atypical antipsychotic (BMI: Lipid Panel: HbgA1c: QTc:)              -- Encouraged patient to participate in unit milieu and in scheduled group therapies        3. Medical Issues Being Addressed:   Home meds restarted. amlodipine  5 mg daily, Breo Ellipta  substituted for Symbicort , metformin  500 mg daily, rosuvastatin  20 mg daily. Consult to diabetes coordinator is placed.   4. Discharge Planning:   -- Social work and case management to assist with discharge planning and identification of hospital follow-up needs prior to discharge  -- Estimated LOS: 3-4 days  Angelia Barcelona, NP 06/22/2023, 1:02 PM

## 2023-06-22 NOTE — Group Note (Signed)
 Date:  06/22/2023 Time:  5:19 PM  Group Topic/Focus:  Wellness Toolbox:   The focus of this group is to discuss various aspects of wellness, balancing those aspects and exploring ways to increase the ability to experience wellness.  Patients will create a wellness toolbox for use upon discharge.    Participation Level:  Did Not Attend   Mellie Sprinkle [facility] 06/22/2023, 5:19 PM

## 2023-06-22 NOTE — [facility] Counselor (Signed)
 CSW spoke the female [address] in [place].  Call was answered on the first ring.    She reports that open interviews are held Wednesdays at 8:15PM. She reports that she will text the other house residents to see if schedules can align outside of the prescheduled interview time to allow patient to interview.  She reports that she will follow up with this CSW.   CSW left contact information.    CSW awaiting a phone call.  Shasta Deist, MSW, LCSW 06/22/2023 2:43 PM

## 2023-06-22 NOTE — [facility] Counselor (Signed)
 CSW provided patient with information on obtaining housing application for .  Application is not available online.   CSW provided patient with the housing application for Caitlin Rivera and informed pt that if she completes the packet CSW will send to the housing authority for her.   Pt expressed understanding.  Shasta Deist, MSW, LCSW 06/22/2023 11:22 AM

## 2023-06-22 NOTE — Group Note (Signed)
 Recreation Therapy Group Note   Group Topic:Health and Wellness  Group Date: 06/22/2023 Start Time: 1000 End Time: 1055 Facilitators: Deatrice Factor, LRT, CTRS Location: Courtyard  Group Description: [employer]. LRT and patients played games of basketball, drew with chalk, and played corn hole while outside in the courtyard while getting fresh air and sunlight. Music was being played in the background. LRT and peers conversed about different games they have played before, what they do in their free time and anything else that is on their minds. LRT encouraged pts to drink water after being outside, sweating and getting their heart rate up.  Goal Area(s) Addressed: Patient will build on frustration tolerance skills. Patients will partake in a competitive play game with peers. Patients will gain knowledge of new leisure interest/hobby.   Affect/Mood: Appropriate   Participation Level: Active and Engaged   Participation Quality: Independent   Behavior: Appropriate, Calm, and Cooperative   Speech/Thought Process: Coherent   Insight: Good   Judgement: Good   Modes of Intervention: Exploration, Music, Rapport Building, and Socialization   Patient Response to Interventions:  Attentive, Engaged, Interested , and Receptive   Education Outcome:  Acknowledges education   Clinical Observations/Individualized Feedback: Hilarie was active in their participation of session activities and group discussion. Pt interacted well with LRT and peers duration of session.    Plan: Continue to engage patient in RT group sessions 2-3x/week.   Deatrice Factor, LRT, CTRS 06/22/2023 1:33 PM

## 2023-06-22 NOTE — [facility] Counselor (Signed)
 Patient has been declined at [facility] due to past behaviors and self medicating.  Director also reports that patient receives $1700 in SSI/Disability.  Shasta Deist, MSW, LCSW 06/22/2023 8:33 AM

## 2023-06-23 DIAGNOSIS — F333 Major depressive disorder, recurrent, severe with psychotic symptoms: Secondary | ICD-10-CM | POA: Diagnosis not present

## 2023-06-23 MED ORDER — DULOXETINE HCL 20 MG PO CPEP
40.0000 mg | ORAL_CAPSULE | Freq: Two times a day (BID) | ORAL | Status: DC
  Administered 2023-06-24 – 2023-06-30 (×13): 40 mg via ORAL
  Filled 2023-06-23 (×14): qty 2

## 2023-06-23 MED ORDER — TRAZODONE HCL 100 MG PO TABS
100.0000 mg | ORAL_TABLET | Freq: Every day | ORAL | Status: DC
  Administered 2023-06-24 – 2023-06-29 (×6): 100 mg via ORAL
  Filled 2023-06-23 (×6): qty 1

## 2023-06-23 NOTE — Group Note (Signed)
 Date:  06/23/2023 Time:  8:46 PM  Group Topic/Focus:  Orientation:   The focus of this group is to educate the patient on the purpose and policies of crisis stabilization and provide a format to answer questions about their admission.  The group details unit policies and expectations of patients while admitted.    Participation Level:  Active  Participation Quality:  Appropriate, Attentive, and Sharing  Affect:  Appropriate  Cognitive:  Alert and Appropriate  Insight: Appropriate and Good  Engagement in Group:  Developing/Improving and Engaged  Modes of Intervention:  Discussion, Education, Orientation, Rapport Building, and Support  Additional Comments:     Caitlin Rivera 06/23/2023, 8:46 PM

## 2023-06-23 NOTE — Plan of Care (Signed)
  Problem: Education: Goal: Emotional status will improve Outcome: Progressing Goal: Mental status will improve Outcome: Progressing Goal: Verbalization of understanding the information provided will improve Outcome: Progressing   Problem: Health Behavior/Discharge Planning: Goal: Compliance with treatment plan for underlying cause of condition will improve Outcome: Progressing   Problem: Physical Regulation: Goal: Ability to maintain clinical measurements within normal limits will improve Outcome: Progressing   Problem: Safety: Goal: Periods of time without injury will increase Outcome: Progressing

## 2023-06-23 NOTE — Plan of Care (Signed)
 Caitlin Rivera is a 68 y.o. female patient. No diagnosis found. 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   COPD (chronic obstructive pulmonary disease) (HCC)    smoker   Diabetes mellitus without complication (HCC)    Fibroids    Hypertension    Personal history of radiation therapy    Smoker    Current Facility-Administered Medications  Medication Dose Route Frequency Provider Last Rate Last Admin   acetaminophen  (TYLENOL ) tablet 650 mg  650 mg Oral Q6H PRN Bobbitt, Shalon E, NP   650 mg at 06/22/23 0823   albuterol  (VENTOLIN  HFA) 108 (90 Base) MCG/ACT inhaler 1-2 puff  1-2 puff Inhalation Q6H PRN Bobbitt, Shalon E, NP   2 puff at 06/21/23 0833   alum & mag hydroxide-simeth (MAALOX/MYLANTA) 200-200-20 MG/5ML suspension 30 mL  30 mL Oral Q4H PRN Bobbitt, Shalon E, NP       amLODipine  (NORVASC ) tablet 5 mg  5 mg Oral Daily Bobbitt, Shalon E, NP   5 mg at 06/22/23 4098   ARIPiprazole  (ABILIFY ) tablet 2 mg  2 mg Oral Daily Millington, Matthew E, PA-C   2 mg at 06/22/23 1191   haloperidol  (HALDOL ) tablet 5 mg  5 mg Oral TID PRN Bobbitt, Shalon E, NP       And   diphenhydrAMINE  (BENADRYL ) capsule 50 mg  50 mg Oral TID PRN Bobbitt, Shalon E, NP       haloperidol  lactate (HALDOL ) injection 5 mg  5 mg Intramuscular TID PRN Bobbitt, Shalon E, NP       And   diphenhydrAMINE  (BENADRYL ) injection 50 mg  50 mg Intramuscular TID PRN Bobbitt, Shalon E, NP       And   LORazepam  (ATIVAN ) injection 2 mg  2 mg Intramuscular TID PRN Bobbitt, Shalon E, NP       DULoxetine  (CYMBALTA ) DR capsule 20 mg  20 mg Oral BID Bobbitt, Shalon E, NP   20 mg at 06/22/23 1723   fluticasone  furoate-vilanterol (BREO ELLIPTA ) 200-25 MCG/ACT 1 puff  1 puff Inhalation Daily Bobbitt, Shalon E, NP   1 puff at 06/22/23 0824   gabapentin  (NEURONTIN ) capsule 600 mg  600 mg Oral BID Bobbitt, Shalon E, NP   600 mg at 06/22/23 1723   hydrOXYzine  (ATARAX ) tablet 50 mg  50 mg Oral  QHS Bobbitt, Shalon E, NP   50 mg at 06/22/23 2122   magnesium  hydroxide (MILK OF MAGNESIA) suspension 30 mL  30 mL Oral Daily PRN Bobbitt, Shalon E, NP       metFORMIN  (GLUCOPHAGE ) tablet 500 mg  500 mg Oral Q breakfast Bobbitt, Shalon E, NP   500 mg at 06/22/23 0823   prazosin  (MINIPRESS ) capsule 1 mg  1 mg Oral QHS Bobbitt, Shalon E, NP   1 mg at 06/22/23 2122   rosuvastatin  (CRESTOR ) tablet 20 mg  20 mg Oral Daily Bobbitt, Shalon E, NP   20 mg at 06/22/23 4782   tiZANidine  (ZANAFLEX ) tablet 4 mg  4 mg Oral Q8H PRN Bobbitt, Shalon E, NP   4 mg at 06/21/23 2131   traZODone  (DESYREL ) tablet 50 mg  50 mg Oral QHS PRN Bobbitt, Shalon E, NP   50 mg at 06/22/23 2123   Allergies  Allergen Reactions   Ibuprofen Itching   Morphine  And Codeine Itching   Principal Problem:   MDD (major depressive disorder), recurrent episode, severe (HCC) Active Problems:  Cocaine abuse, episodic (HCC)  Blood pressure (!) 153/85, pulse 95, temperature 99.1 F (37.3 C), temperature source Oral, resp. rate 20, height 5' 6 (1.676 m), weight 107.5 kg, SpO2 96%.   Note: Patient denies SI and HI, continued contract to safety verbally. She verbalized no formalized D/C plan yet but mentioned she applied to [facility] on 06/22/23.   Caitlin Rivera 06/23/2023

## 2023-06-23 NOTE — Group Note (Signed)
 Recreation Therapy Group Note   Group Topic:Leisure Education  Group Date: 06/23/2023 Start Time: 1045 End Time: 1145 Facilitators: Deatrice Factor, LRT, CTRS Location: Craft Room  Group Description: Leisure. Patients were given the option to choose from singing karaoke, coloring mandalas, using oil pastels, journaling, or playing with play-doh. LRT and pts discussed the meaning of leisure, the importance of participating in leisure during their free time/when they're outside of the hospital, as well as how our leisure interests can also serve as coping skills.   Goal Area(s) Addressed:  Patient will identify a current leisure interest.  Patient will learn the definition of "leisure". Patient will practice making a positive decision. Patient will have the opportunity to try a new leisure activity. Patient will communicate with peers and LRT.    Affect/Mood: N/A   Participation Level: Did not attend    Clinical Observations/Individualized Feedback: Patient did not attend group.   Plan: Continue to engage patient in RT group sessions 2-3x/week.   Deatrice Factor, LRT, CTRS 06/23/2023 1:20 PM

## 2023-06-23 NOTE — Progress Notes (Signed)
 [facility] MD Progress Note  06/23/2023 11:13 PM Caitlin Rivera  MRN:  [id number]  68 year old female presented voluntarily to the ED for evaluation of depression and suicidal ideation. They are admitted to behavioral health. There is a known history of depression, DM, morbid obesity, hypercholesterolemia, hypertension, COPD, homelessness, and medication noncompliance.  Patient reports intent to overdose on her medications, though indicated that she had been not been compliant with her medications recently for over a week, citing forgetfulness and stating that the medications "don't work." She also reports being homeless for the past 2 years and feeling unsupported and hungry. During the ED and psychiatric consult, patient expressed feelings of hopelessness, helplessness, and frustration, stating I'll just go get run over by a train.  On exam today she continues to endorse feelings of hopelessness, helplessness, anhedonia, decreased energy, and decreased concentration.  She notes feelings of hopelessness and rates depression as 10 out of 10.  She also notes feelings of guilt and worry rated anxiety at 10 out of 10 as well.   Subjective:  Chart reviewed, case discussed in multidisciplinary meeting, patient seen during rounds.  Patient is noted to be resting in bed.  She reports having poor sleep and wants some help with the insomnia.  She rates her depression as 8 out of 10, 10 being the worst and anxiety is 8 out of 10.  She denies active SI but reports passive suicidal ideation stating she does not mind not currently.  She denies homicidal ideation/plan.  She reports having anxiety but denies panic attacks.  She reports that she is homeless and is working with the social work team to find appropriate placement at this time.  She asked about pain medications tizanidine  that she reports that her primary care gives it to her.  Provider educated her that she will review the PDMP and if she sees an active  prescription it will be ended otherwise patient needs to consult her pain doctor after discharge.  Per nursing staff no behavioral problems she is taking her medications with no reported side effects. Sleep: poor  Appetite:  good  Past Psychiatric History: see h&P Family History:  Family History  Problem Relation Age of Onset   Cancer Mother    Social History:  Social History   Substance and Sexual Activity  Alcohol Use No     Social History   Substance and Sexual Activity  Drug Use Yes   Types: Crack cocaine   Comment: crack-last smoked 2 wks ago-07-24-19, states she uses once a month    Social History   Socioeconomic History   Marital status: Widowed    Spouse name: Not on file   Number of children: Not on file   Years of education: Not on file   Highest education level: Not on file  Occupational History   Not on file  Tobacco Use   Smoking status: Every Day    Current packs/day: 0.50    Average packs/day: 0.5 packs/day for 35.4 years (17.7 ttl pk-yrs)    Types: Cigarettes    Start date: 74   Smokeless tobacco: Never  Substance and Sexual Activity   Alcohol use: No   Drug use: Yes    Types: Crack cocaine    Comment: crack-last smoked 2 wks ago-07-24-19, states she uses once a month   Sexual activity: Not on file  Other Topics Concern   Not on file  Social History Narrative   Not on file   Social Drivers of Health  Financial Resource Strain: Not on file  Food Insecurity: Patient Declined (06/17/2023)   Hunger Vital Sign    Worried About Running Out of Food in the Last Year: Patient declined    Ran Out of Food in the Last Year: Patient declined  Transportation Needs: Unmet Transportation Needs (06/17/2023)   PRAPARE - Administrator, Civil Service (Medical): Yes    Lack of Transportation (Non-Medical): Yes  Physical Activity: Not on file  Stress: Not on file  Social Connections: Moderately Isolated (06/17/2023)   Social Connection and  Isolation Panel    Frequency of Communication with Friends and Family: Twice a week    Frequency of Social Gatherings with Friends and Family: Twice a week    Attends Religious Services: 1 to 4 times per year    Active Member of [employer] or Organizations: No    Attends Banker Meetings: Never    Marital Status: Widowed   Past Medical History:  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   COPD (chronic obstructive pulmonary disease) (HCC)    smoker   Diabetes mellitus without complication (HCC)    Fibroids    Hypertension    Personal history of radiation therapy    Smoker     Past Surgical History:  Procedure Laterality Date   BREAST BIOPSY Bilateral 06/03/2019   BREAST BIOPSY Left 06/14/2019   BREAST EXCISIONAL BIOPSY Right 07/2019   BREAST LUMPECTOMY Left 07/2019   BREAST LUMPECTOMY WITH RADIOACTIVE SEED LOCALIZATION Bilateral 07/31/2019   Procedure: BILATERAL BREAST LUMPECTOMY WITH RADIOACTIVE SEED LOCALIZATION;  Surgeon: Caralyn Chandler, MD;  Location: [facility];  Service: General;  Laterality: Bilateral;   TUBAL LIGATION     TUBAL LIGATION      Current Medications: Current Facility-Administered Medications  Medication Dose Route Frequency Provider Last Rate Last Admin   acetaminophen  (TYLENOL ) tablet 650 mg  650 mg Oral Q6H PRN Bobbitt, Shalon E, NP   650 mg at 06/23/23 0839   albuterol  (VENTOLIN  HFA) 108 (90 Base) MCG/ACT inhaler 1-2 puff  1-2 puff Inhalation Q6H PRN Bobbitt, Shalon E, NP   2 puff at 06/23/23 0844   alum & mag hydroxide-simeth (MAALOX/MYLANTA) 200-200-20 MG/5ML suspension 30 mL  30 mL Oral Q4H PRN Bobbitt, Shalon E, NP       amLODipine  (NORVASC ) tablet 5 mg  5 mg Oral Daily Bobbitt, Shalon E, NP   5 mg at 06/23/23 0836   ARIPiprazole  (ABILIFY ) tablet 2 mg  2 mg Oral Daily Millington, Matthew E, PA-C   2 mg at 06/23/23 1610   haloperidol  (HALDOL ) tablet 5 mg  5 mg Oral TID PRN  Bobbitt, Shalon E, NP       And   diphenhydrAMINE  (BENADRYL ) capsule 50 mg  50 mg Oral TID PRN Bobbitt, Shalon E, NP       haloperidol  lactate (HALDOL ) injection 5 mg  5 mg Intramuscular TID PRN Bobbitt, Shalon E, NP       And   diphenhydrAMINE  (BENADRYL ) injection 50 mg  50 mg Intramuscular TID PRN Bobbitt, Shalon E, NP       And   LORazepam  (ATIVAN ) injection 2 mg  2 mg Intramuscular TID PRN Bobbitt, Shalon E, NP       DULoxetine  (CYMBALTA ) DR capsule 20 mg  20 mg Oral BID Bobbitt, Shalon E, NP   20 mg at 06/23/23 1723   fluticasone  furoate-vilanterol (  BREO ELLIPTA ) 200-25 MCG/ACT 1 puff  1 puff Inhalation Daily Bobbitt, Shalon E, NP   1 puff at 06/22/23 0824   gabapentin  (NEURONTIN ) capsule 600 mg  600 mg Oral BID Bobbitt, Shalon E, NP   600 mg at 06/23/23 1723   hydrOXYzine  (ATARAX ) tablet 50 mg  50 mg Oral QHS Bobbitt, Shalon E, NP   50 mg at 06/23/23 2130   magnesium  hydroxide (MILK OF MAGNESIA) suspension 30 mL  30 mL Oral Daily PRN Bobbitt, Shalon E, NP       metFORMIN  (GLUCOPHAGE ) tablet 500 mg  500 mg Oral Q breakfast Bobbitt, Shalon E, NP   500 mg at 06/23/23 0836   prazosin  (MINIPRESS ) capsule 1 mg  1 mg Oral QHS Bobbitt, Shalon E, NP   1 mg at 06/23/23 2132   rosuvastatin  (CRESTOR ) tablet 20 mg  20 mg Oral Daily Bobbitt, Shalon E, NP   20 mg at 06/23/23 0838   tiZANidine  (ZANAFLEX ) tablet 4 mg  4 mg Oral Q8H PRN Bobbitt, Shalon E, NP   4 mg at 06/23/23 2131   traZODone  (DESYREL ) tablet 50 mg  50 mg Oral QHS PRN Bobbitt, Shalon E, NP   50 mg at 06/23/23 2130    Lab Results:  No results found for this or any previous visit (from the past 48 hours).   Blood Alcohol level:  Lab Results  Component Value Date   [facility] <15 06/15/2023   ETH <10 02/10/2023    Metabolic Disorder Labs: Lab Results  Component Value Date   HGBA1C 6.6 (H) 02/03/2023   MPG 142.72 02/03/2023   No results found for: PROLACTIN Lab Results  Component Value Date   CHOL 151 06/21/2023   TRIG 126  06/21/2023   HDL 45 06/21/2023   CHOLHDL 3.4 06/21/2023   VLDL 25 06/21/2023   LDLCALC 81 06/21/2023    Psychiatric Specialty Exam:  Presentation  General Appearance:  Appropriate for Environment  Eye Contact: Good  Speech: Normal Rate  Speech Volume: Normal    Mood and Affect  Mood: Depressed  Affect: Flat   Thought Process  Thought Processes: Linear  Descriptions of Associations:Intact  Orientation:Full (Time, Place and Person)  Thought Content:reality based  Hallucinations:chronic auditory hallucinations, not command type   Ideas of Reference:None  Suicidal Thoughts:passive SI   Homicidal Thoughts:denies    Sensorium  Memory: Immediate Poor  Judgment: Fair  Insight: Poor   Executive Functions  Concentration: Good  Attention Span: Fair  Recall: Good  Fund of Knowledge: Good  Language: Good   Psychomotor Activity  Psychomotor Activity: slowed   Musculoskeletal: Strength & Muscle Tone: within normal limits Gait & Station: unsteady Assets  Assets: Manufacturing systems engineer; Desire for Improvement    Physical Exam: Physical Exam Vitals and nursing note reviewed.  Constitutional:      Appearance: She is obese.  HENT:     Head: Atraumatic.   Eyes:     Extraocular Movements: Extraocular movements intact.   Pulmonary:     Effort: Pulmonary effort is normal.   Neurological:     Mental Status: She is alert and oriented to person, place, and time.   Psychiatric:        Behavior: Behavior normal.    Review of Systems  Psychiatric/Behavioral:  Positive for depression, hallucinations and suicidal ideas. Negative for substance abuse. The patient is nervous/anxious. The patient does not have insomnia.    Blood pressure (!) 167/76, pulse 98, temperature (!) 97.4 F (36.3 C), resp. rate  20, height 5' 6 (1.676 m), weight 107.5 kg, SpO2 97%. Body mass index is 38.25 kg/m.  Diagnosis: Principal Problem:   MDD (major  depressive disorder), recurrent episode, severe (HCC) Active Problems:   Cocaine abuse, episodic (HCC)   PLAN: Safety and Monitoring:  -- Voluntary admission to inpatient psychiatric unit for safety, stabilization and treatment  -- Daily contact with patient to assess and evaluate symptoms and progress in treatment  -- Patient's case to be discussed in multi-disciplinary team meeting  -- Observation Level : q15 minute checks  -- Vital signs:  q12 hours  -- Precautions: suicide, elopement, and assault -- Encouraged patient to participate in unit milieu and in scheduled group therapies  2. Psychiatric Diagnoses and Treatment:                           MDD:             Continue Cymbalta  20 mg twice daily- will titrate it up to 40 mg BID  Abilify  2 mg daily             PTSD rule out             Prazosin  1 mg nightly             Anxiety/insomnia             Hydroxyzine  50 mg at bedtime- will change to trazodone              gabapentin  600 mg twice daily  -- The risks/benefits/side-effects/alternatives to this medication were discussed in detail with the patient and time was given for questions. The patient consents to medication trial.                -- Metabolic profile and EKG monitoring obtained while on an atypical antipsychotic (BMI: Lipid Panel: HbgA1c: QTc:)              -- Encouraged patient to participate in unit milieu and in scheduled group therapies        3. Medical Issues Being Addressed:   Home meds restarted. amlodipine  5 mg daily, Breo Ellipta  substituted for Symbicort , metformin  500 mg daily, rosuvastatin  20 mg daily. Consult to diabetes coordinator is placed.   4. Discharge Planning:   -- Social work and case management to assist with discharge planning and identification of hospital follow-up needs prior to discharge  -- Estimated LOS: 3-4 days  Danne Vasek, MD 06/23/2023, 11:13 PM

## 2023-06-23 NOTE — Group Note (Signed)
 Date:  06/23/2023 Time:  6:49 PM  Group Topic/Focus:  Dimensions of Wellness:   The focus of this group is to introduce the topic of wellness and discuss the role each dimension of wellness plays in total health.    Participation Level:  Active  Participation Quality:  Appropriate  Affect:  Appropriate  Cognitive:  Appropriate  Insight: Appropriate  Engagement in Group:  Engaged  Modes of Intervention:  Activity and Socialization  Additional Comments:    Laverne Potter 06/23/2023, 6:49 PM

## 2023-06-23 NOTE — Progress Notes (Signed)
   06/23/23 0900  Psych Admission Type (Psych Patients Only)  Admission Status Voluntary  Psychosocial Assessment  Patient Complaints Sleep disturbance  Eye Contact Fair  Facial Expression Animated  Affect Appropriate to circumstance  Speech Logical/coherent  Interaction Assertive  Motor Activity Slow;Unsteady  Appearance/Hygiene Unremarkable;In scrubs  Behavior Characteristics Cooperative  Mood Pleasant  Thought Process  Coherency WDL  Content WDL  Delusions None reported or observed  Perception Hallucinations  Hallucination Auditory;Visual (Patient reports she sees and talks to deceased family members)  Judgment Impaired  Confusion None  Danger to Self  Current suicidal ideation? Denies  Self-Injurious Behavior No self-injurious ideation or behavior indicators observed or expressed   Danger to Others  Danger to Others None reported or observed

## 2023-06-24 NOTE — Group Note (Signed)
 Date:  06/24/2023 Time:  6:14 PM  Group Topic/Focus:  Wellness Toolbox:   The focus of this group is to discuss various aspects of wellness, balancing those aspects and exploring ways to increase the ability to experience wellness.  Patients will create a wellness toolbox for use upon discharge.    Participation Level:  Did Not Attend   Mellie Sprinkle [facility] 06/24/2023, 6:14 PM

## 2023-06-24 NOTE — Progress Notes (Addendum)
 [facility] MD Progress Note  06/24/2023 10:38 AM Caitlin Rivera Caitlin Rivera  MRN:  [id number]  68 year old female presented voluntarily to the ED for evaluation of depression and suicidal ideation. They are admitted to behavioral health. There is a known history of depression, DM, morbid obesity, hypercholesterolemia, hypertension, COPD, homelessness, and medication noncompliance.  Patient reports intent to overdose on her medications, though indicated that she had been not been compliant with her medications recently for over a week, citing forgetfulness and stating that the medications "don't work." She also reports being homeless for the past 2 years and feeling unsupported and hungry. During the ED and psychiatric consult, patient expressed feelings of hopelessness, helplessness, and frustration, stating I'll just go get run over by a train.  On exam today she continues to endorse feelings of hopelessness, helplessness, anhedonia, decreased energy, and decreased concentration.  She notes feelings of hopelessness and rates depression as 10 out of 10.  She also notes feelings of guilt and worry rated anxiety at 10 out of 10 as well.   Subjective:  Chart reviewed, patient seen during rounds.  Patient seen today for follow-up psychiatric relation.  Upon approach she is noted to be in bed upon approach she states her night okay she continues to report significant depression 6/10 and constant anxiety.  She denies thoughts to harm herself or anyone else however shares I'm just tired of living. She denies AVH paranoia and delusions. Thoughts are negativistic In reviewing medications she denies side effects.  She continues to request pain medication which review advised from attending to consult her pain doctor after discharge.  Per nursing staff no behavioral problems she is taking her medications with no reported side effects. Homelessness remains barrier to safe discharge, social work involved in planning. Patient is  medication compliant.   Sleep: poor  Appetite:  good  Past Psychiatric History: see h&P Family History:  Family History  Problem Relation Age of Onset   Cancer Mother    Social History:  Social History   Substance and Sexual Activity  Alcohol Use No     Social History   Substance and Sexual Activity  Drug Use Yes   Types: Crack cocaine   Comment: crack-last smoked 2 wks ago-07-24-19, states she uses once a month    Social History   Socioeconomic History   Marital status: Widowed    Spouse name: Not on file   Number of children: Not on file   Years of education: Not on file   Highest education level: Not on file  Occupational History   Not on file  Tobacco Use   Smoking status: Every Day    Current packs/day: 0.50    Average packs/day: 0.5 packs/day for 35.4 years (17.7 ttl pk-yrs)    Types: Cigarettes    Start date: 27   Smokeless tobacco: Never  Substance and Sexual Activity   Alcohol use: No   Drug use: Yes    Types: Crack cocaine    Comment: crack-last smoked 2 wks ago-07-24-19, states she uses once a month   Sexual activity: Not on file  Other Topics Concern   Not on file  Social History Narrative   Not on file   Social Drivers of Health   Financial Resource Strain: Not on file  Food Insecurity: Patient Declined (06/17/2023)   Hunger Vital Sign    Worried About Running Out of Food in the Last Year: Patient declined    Ran Out of Food in the Last Year: Patient  declined  Transportation Needs: Unmet Transportation Needs (06/17/2023)   PRAPARE - Administrator, Civil Service (Medical): Yes    Lack of Transportation (Non-Medical): Yes  Physical Activity: Not on file  Stress: Not on file  Social Connections: Moderately Isolated (06/17/2023)   Social Connection and Isolation Panel    Frequency of Communication with Friends and Family: Twice a week    Frequency of Social Gatherings with Friends and Family: Twice a week    Attends Religious  Services: 1 to 4 times per year    Active Member of [employer] or Organizations: No    Attends Banker Meetings: Never    Marital Status: Widowed   Past Medical History:  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   COPD (chronic obstructive pulmonary disease) (HCC)    smoker   Diabetes mellitus without complication (HCC)    Fibroids    Hypertension    Personal history of radiation therapy    Smoker     Past Surgical History:  Procedure Laterality Date   BREAST BIOPSY Bilateral 06/03/2019   BREAST BIOPSY Left 06/14/2019   BREAST EXCISIONAL BIOPSY Right 07/2019   BREAST LUMPECTOMY Left 07/2019   BREAST LUMPECTOMY WITH RADIOACTIVE SEED LOCALIZATION Bilateral 07/31/2019   Procedure: BILATERAL BREAST LUMPECTOMY WITH RADIOACTIVE SEED LOCALIZATION;  Surgeon: Caralyn Chandler, MD;  Location: [facility];  Service: General;  Laterality: Bilateral;   TUBAL LIGATION     TUBAL LIGATION      Current Medications: Current Facility-Administered Medications  Medication Dose Route Frequency Provider Last Rate Last Admin   acetaminophen  (TYLENOL ) tablet 650 mg  650 mg Oral Q6H PRN Bobbitt, Shalon E, NP   650 mg at 06/23/23 0839   albuterol  (VENTOLIN  HFA) 108 (90 Base) MCG/ACT inhaler 1-2 puff  1-2 puff Inhalation Q6H PRN Bobbitt, Shalon E, NP   2 puff at 06/24/23 0841   alum & mag hydroxide-simeth (MAALOX/MYLANTA) 200-200-20 MG/5ML suspension 30 mL  30 mL Oral Q4H PRN Bobbitt, Shalon E, NP       amLODipine  (NORVASC ) tablet 5 mg  5 mg Oral Daily Bobbitt, Shalon E, NP   5 mg at 06/24/23 1610   ARIPiprazole  (ABILIFY ) tablet 2 mg  2 mg Oral Daily Millington, Matthew E, PA-C   2 mg at 06/24/23 9604   haloperidol  (HALDOL ) tablet 5 mg  5 mg Oral TID PRN Bobbitt, Shalon E, NP       And   diphenhydrAMINE  (BENADRYL ) capsule 50 mg  50 mg Oral TID PRN Bobbitt, Shalon E, NP       haloperidol  lactate (HALDOL ) injection 5 mg  5 mg  Intramuscular TID PRN Bobbitt, Shalon E, NP       And   diphenhydrAMINE  (BENADRYL ) injection 50 mg  50 mg Intramuscular TID PRN Bobbitt, Shalon E, NP       And   LORazepam  (ATIVAN ) injection 2 mg  2 mg Intramuscular TID PRN Bobbitt, Shalon E, NP       DULoxetine  (CYMBALTA ) DR capsule 40 mg  40 mg Oral BID Jadapalle, Sree, MD   40 mg at 06/24/23 0837   fluticasone  furoate-vilanterol (BREO ELLIPTA ) 200-25 MCG/ACT 1 puff  1 puff Inhalation Daily Bobbitt, Shalon E, NP   1 puff at 06/24/23 0840   gabapentin  (NEURONTIN ) capsule 600 mg  600 mg Oral BID Bobbitt, Shalon E, NP   600 mg at 06/24/23 [phone]  magnesium  hydroxide (MILK OF MAGNESIA) suspension 30 mL  30 mL Oral Daily PRN Bobbitt, Shalon E, NP       metFORMIN  (GLUCOPHAGE ) tablet 500 mg  500 mg Oral Q breakfast Bobbitt, Shalon E, NP   500 mg at 06/24/23 0837   prazosin  (MINIPRESS ) capsule 1 mg  1 mg Oral QHS Bobbitt, Shalon E, NP   1 mg at 06/23/23 2132   rosuvastatin  (CRESTOR ) tablet 20 mg  20 mg Oral Daily Bobbitt, Shalon E, NP   20 mg at 06/24/23 0839   tiZANidine  (ZANAFLEX ) tablet 4 mg  4 mg Oral Q8H PRN Bobbitt, Shalon E, NP   4 mg at 06/24/23 1610   traZODone  (DESYREL ) tablet 100 mg  100 mg Oral QHS Jadapalle, Sree, MD       traZODone  (DESYREL ) tablet 50 mg  50 mg Oral QHS PRN Bobbitt, Shalon E, NP   50 mg at 06/23/23 2130    Lab Results:  No results found for this or any previous visit (from the past 48 hours).   Blood Alcohol level:  Lab Results  Component Value Date   [facility] <15 06/15/2023   ETH <10 02/10/2023    Metabolic Disorder Labs: Lab Results  Component Value Date   HGBA1C 6.6 (H) 02/03/2023   MPG 142.72 02/03/2023   No results found for: PROLACTIN Lab Results  Component Value Date   CHOL 151 06/21/2023   TRIG 126 06/21/2023   HDL 45 06/21/2023   CHOLHDL 3.4 06/21/2023   VLDL 25 06/21/2023   LDLCALC 81 06/21/2023    Psychiatric Specialty Exam:  Presentation  General Appearance:  Appropriate for  Environment  Eye Contact: Good  Speech: Normal Rate  Speech Volume: Normal    Mood and Affect  Mood: Depressed  Affect: Flat   Thought Process  Thought Processes: Linear  Descriptions of Associations:Intact  Orientation:Full (Time, Place and Person)  Thought Content:reality based  Hallucinations:chronic auditory hallucinations, not command type   Ideas of Reference:None  Suicidal Thoughts:passive SI   Homicidal Thoughts:denies    Sensorium  Memory: Immediate Poor  Judgment: Fair  Insight: Poor   Executive Functions  Concentration: Good  Attention Span: Fair  Recall: Good  Fund of Knowledge: Good  Language: Good   Psychomotor Activity  Psychomotor Activity: slowed   Musculoskeletal: Strength & Muscle Tone: within normal limits Gait & Station: unsteady Assets  Assets: Manufacturing systems engineer; Desire for Improvement    Physical Exam: Physical Exam Vitals and nursing note reviewed.  Constitutional:      Appearance: She is obese.  HENT:     Head: Atraumatic.   Eyes:     Extraocular Movements: Extraocular movements intact.   Pulmonary:     Effort: Pulmonary effort is normal.   Neurological:     Mental Status: She is alert and oriented to person, place, and time.   Psychiatric:        Behavior: Behavior normal.    Review of Systems  Psychiatric/Behavioral:  Positive for depression, hallucinations and suicidal ideas. Negative for substance abuse. The patient is nervous/anxious. The patient does not have insomnia.    Blood pressure 135/66, pulse 87, temperature 98.8 F (37.1 C), temperature source Oral, resp. rate (!) 22, height 5' 6 (1.676 m), weight 107.5 kg, SpO2 95%. Body mass index is 38.25 kg/m.  Diagnosis: Principal Problem:   MDD (major depressive disorder), recurrent episode, severe (HCC) Active Problems:   Cocaine abuse, episodic (HCC)   PLAN: Safety and Monitoring:  -- Voluntary admission  to  inpatient psychiatric unit for safety, stabilization and treatment  -- Daily contact with patient to assess and evaluate symptoms and progress in treatment  -- Patient's case to be discussed in multi-disciplinary team meeting  -- Observation Level : q15 minute checks  -- Vital signs:  q12 hours  -- Precautions: suicide, elopement, and assault -- Encouraged patient to participate in unit milieu and in scheduled group therapies  2. Psychiatric Diagnoses and Treatment:                           MDD:             Continue Cymbalta  20 mg twice daily- will titrate it up to 40 mg BID  Abilify  2 mg daily             PTSD rule out             Prazosin  1 mg nightly             Anxiety/insomnia             Hydroxyzine  50 mg at bedtime- will change to trazodone              gabapentin  600 mg twice daily  -- The risks/benefits/side-effects/alternatives to this medication were discussed in detail with the patient and time was given for questions. The patient consents to medication trial.                -- Metabolic profile and EKG monitoring obtained while on an atypical antipsychotic (BMI: Lipid Panel: HbgA1c: QTc:)              -- Encouraged patient to participate in unit milieu and in scheduled group therapies        3. Medical Issues Being Addressed:   Home meds restarted. amlodipine  5 mg daily, Breo Ellipta  substituted for Symbicort , metformin  500 mg daily, rosuvastatin  20 mg daily. Consult to diabetes coordinator is placed.   4. Discharge Planning:   -- Social work and case management to assist with discharge planning and identification of hospital follow-up needs prior to discharge  -- Estimated LOS: 3-4 days  Angelia Barcelona, NP 06/24/2023, 10:38 AM

## 2023-06-24 NOTE — Group Note (Signed)
 Date:  06/24/2023 Time:  11:19 AM  Group Topic/Focus:  Goals Group:   The focus of this group is to help patients establish daily goals to achieve during treatment and discuss how the patient can incorporate goal setting into their daily lives to aide in recovery.    Participation Level:  Did Not Attend   Caitlin Rivera 06/24/2023, 11:19 AM

## 2023-06-24 NOTE — Progress Notes (Signed)
   06/24/23 0900  Psych Admission Type (Psych Patients Only)  Admission Status Voluntary  Psychosocial Assessment  Patient Complaints Sleep disturbance  Eye Contact Fair  Facial Expression Other (Comment) (WNL)  Affect Appropriate to circumstance  Speech Logical/coherent  Interaction Assertive  Motor Activity Slow  Appearance/Hygiene Unremarkable;In scrubs  Behavior Characteristics Cooperative;Appropriate to situation  Mood Pleasant  Thought Process  Coherency WDL  Content WDL  Delusions None reported or observed  Perception Hallucinations  Hallucination Auditory  Judgment Impaired  Confusion None  Danger to Self  Current suicidal ideation? Denies  Self-Injurious Behavior No self-injurious ideation or behavior indicators observed or expressed   Danger to Others  Danger to Others None reported or observed

## 2023-06-24 NOTE — Plan of Care (Signed)
  Problem: Coping: Goal: Ability to verbalize frustrations and anger appropriately will improve Outcome: Progressing   

## 2023-06-24 NOTE — BH IP Treatment Plan (Signed)
 Interdisciplinary Treatment and Diagnostic Plan Update  06/24/2023 Time of Session: 9:20 am Kinberly Perris MRN: [id number]  Principal Diagnosis: MDD (major depressive disorder), recurrent episode, severe (HCC)  Secondary Diagnoses: Principal Problem:   MDD (major depressive disorder), recurrent episode, severe (HCC) Active Problems:   Cocaine abuse, episodic (HCC)   Current Medications:  Current Facility-Administered Medications  Medication Dose Route Frequency Provider Last Rate Last Admin   acetaminophen  (TYLENOL ) tablet 650 mg  650 mg Oral Q6H PRN Bobbitt, Shalon E, NP   650 mg at 06/23/23 0839   albuterol  (VENTOLIN  HFA) 108 (90 Base) MCG/ACT inhaler 1-2 puff  1-2 puff Inhalation Q6H PRN Bobbitt, Shalon E, NP   2 puff at 06/24/23 0841   alum & mag hydroxide-simeth (MAALOX/MYLANTA) 200-200-20 MG/5ML suspension 30 mL  30 mL Oral Q4H PRN Bobbitt, Shalon E, NP       amLODipine  (NORVASC ) tablet 5 mg  5 mg Oral Daily Bobbitt, Shalon E, NP   5 mg at 06/24/23 3557   ARIPiprazole  (ABILIFY ) tablet 2 mg  2 mg Oral Daily Millington, Matthew E, PA-C   2 mg at 06/24/23 3220   haloperidol  (HALDOL ) tablet 5 mg  5 mg Oral TID PRN Bobbitt, Shalon E, NP       And   diphenhydrAMINE  (BENADRYL ) capsule 50 mg  50 mg Oral TID PRN Bobbitt, Shalon E, NP       haloperidol  lactate (HALDOL ) injection 5 mg  5 mg Intramuscular TID PRN Bobbitt, Shalon E, NP       And   diphenhydrAMINE  (BENADRYL ) injection 50 mg  50 mg Intramuscular TID PRN Bobbitt, Shalon E, NP       And   LORazepam  (ATIVAN ) injection 2 mg  2 mg Intramuscular TID PRN Bobbitt, Shalon E, NP       DULoxetine  (CYMBALTA ) DR capsule 40 mg  40 mg Oral BID Jadapalle, Sree, MD   40 mg at 06/24/23 0837   fluticasone  furoate-vilanterol (BREO ELLIPTA ) 200-25 MCG/ACT 1 puff  1 puff Inhalation Daily Bobbitt, Shalon E, NP   1 puff at 06/24/23 0840   gabapentin  (NEURONTIN ) capsule 600 mg  600 mg Oral BID Bobbitt, Shalon E, NP   600 mg at 06/24/23 2542    magnesium  hydroxide (MILK OF MAGNESIA) suspension 30 mL  30 mL Oral Daily PRN Bobbitt, Shalon E, NP       metFORMIN  (GLUCOPHAGE ) tablet 500 mg  500 mg Oral Q breakfast Bobbitt, Shalon E, NP   500 mg at 06/24/23 7062   prazosin  (MINIPRESS ) capsule 1 mg  1 mg Oral QHS Bobbitt, Shalon E, NP   1 mg at 06/23/23 2132   rosuvastatin  (CRESTOR ) tablet 20 mg  20 mg Oral Daily Bobbitt, Shalon E, NP   20 mg at 06/24/23 3762   tiZANidine  (ZANAFLEX ) tablet 4 mg  4 mg Oral Q8H PRN Bobbitt, Shalon E, NP   4 mg at 06/24/23 8315   traZODone  (DESYREL ) tablet 100 mg  100 mg Oral QHS Jadapalle, Sree, MD       traZODone  (DESYREL ) tablet 50 mg  50 mg Oral QHS PRN Bobbitt, Shalon E, NP   50 mg at 06/23/23 2130   PTA Medications: Medications Prior to Admission  Medication Sig Dispense Refill Last Dose/Taking   albuterol  (VENTOLIN  HFA) 108 (90 Base) MCG/ACT inhaler Inhale 1-2 puffs into the lungs every 6 (six) hours as needed for wheezing or shortness of breath. 6.7 g 0    amLODipine  (NORVASC ) 5 MG tablet Take 1  tablet (5 mg total) by mouth daily. 30 tablet 0    DULoxetine  (CYMBALTA ) 20 MG capsule Take 1 capsule (20 mg total) by mouth 2 (two) times daily. 60 capsule 0    gabapentin  (NEURONTIN ) 300 MG capsule Take 2 capsules (600 mg total) by mouth 2 (two) times daily. 120 capsule 0    hydrOXYzine  (ATARAX ) 50 MG tablet Take 1 tablet (50 mg total) by mouth at bedtime. 30 tablet 0    metFORMIN  (GLUCOPHAGE ) 500 MG tablet Take 1 tablet (500 mg total) by mouth daily with breakfast. 30 tablet 0    prazosin  (MINIPRESS ) 1 MG capsule Take 1 capsule (1 mg total) by mouth at bedtime. 30 capsule 0    rosuvastatin  (CRESTOR ) 20 MG tablet Take 1 tablet (20 mg total) by mouth daily. 30 tablet 0    SYMBICORT  160-4.5 MCG/ACT inhaler Inhale 2 puffs into the lungs in the morning and at bedtime. 6 g 0    tiZANidine  (ZANAFLEX ) 4 MG tablet Take 1 tablet (4 mg total) by mouth every 8 (eight) hours as needed for muscle spasms. 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   Physician Treatment Plan for Primary Diagnosis: MDD (major depressive disorder), recurrent episode, severe (HCC) Long Term Goal(s): Improvement in symptoms so as ready for discharge   Short Term Goals: Ability to identify and develop effective coping behaviors will improve Ability to maintain clinical measurements within normal limits will improve Compliance with prescribed medications will improve Ability to identify triggers associated with substance abuse/mental health issues will improve  Medication Management: Evaluate patient's response, side effects, and tolerance of medication regimen.  Therapeutic Interventions: 1 to 1 sessions, Unit Group sessions and Medication administration.  Evaluation of Outcomes: Progressing  Physician Treatment Plan for Secondary Diagnosis: Principal Problem:   MDD (major depressive disorder), recurrent episode, severe (HCC) Active Problems:   Cocaine abuse, episodic (HCC)  Long Term Goal(s): Improvement in symptoms so as ready for discharge   Short Term Goals: Ability to identify and develop effective coping behaviors will improve Ability to maintain clinical measurements within normal limits will improve Compliance with prescribed medications will improve Ability to identify triggers associated with substance abuse/mental health issues will improve     Medication Management: Evaluate patient's response, side effects, and tolerance of medication regimen.  Therapeutic Interventions: 1 to 1 sessions, Unit Group sessions and Medication administration.  Evaluation of Outcomes: Progressing   RN Treatment  Plan for Primary Diagnosis: MDD (major depressive disorder), recurrent episode, severe (HCC) Long Term Goal(s): Knowledge of disease and therapeutic regimen to maintain health will improve  Short Term Goals: Ability to remain free from injury will improve, Ability to verbalize frustration and anger appropriately will improve, Ability to demonstrate self-control, Ability to participate in decision making will improve, Ability to verbalize feelings will improve, Ability to disclose and discuss suicidal ideas, Ability to identify and develop effective coping behaviors will improve, and Compliance with prescribed medications will improve  Medication Management: RN will administer medications as ordered by provider, will assess and evaluate patient's response and provide education to patient for prescribed medication. RN will report any adverse and/or side effects to prescribing provider.  Therapeutic Interventions: 1 on 1 counseling sessions, Psychoeducation,  Medication administration, Evaluate responses to treatment, Monitor vital signs and CBGs as ordered, Perform/monitor CIWA, COWS, AIMS and Fall Risk screenings as ordered, Perform wound care treatments as ordered.  Evaluation of Outcomes: Progressing   LCSW Treatment Plan for Primary Diagnosis: MDD (major depressive disorder), recurrent episode, severe (HCC) Long Term Goal(s): Safe transition to appropriate next level of care at discharge, Engage patient in therapeutic group addressing interpersonal concerns.  Short Term Goals: Engage patient in aftercare planning with referrals and resources, Increase social support, Increase ability to appropriately verbalize feelings, Increase emotional regulation, Facilitate acceptance of mental health diagnosis and concerns, Facilitate patient progression through stages of change regarding substance use diagnoses and concerns, Identify triggers associated with mental health/substance abuse issues, and Increase  skills for wellness and recovery  Therapeutic Interventions: Assess for all discharge needs, 1 to 1 time with Social worker, Explore available resources and support systems, Assess for adequacy in community support network, Educate family and significant other(s) on suicide prevention, Complete Psychosocial Assessment, Interpersonal group therapy.  Evaluation of Outcomes: Progressing   Progress in Treatment: Attending groups: Yes. Participating in groups: No. Taking medication as prescribed: Yes. Toleration medication: Yes. Family/Significant other contact made: No, Will contact once permission is provided Patient understands diagnosis: Yes. Discussing patient identified problems/goals with staff: Yes. Medical problems stabilized or resolved: Yes. Denies suicidal/homicidal ideation: Yes. Issues/concerns per patient self-inventory: No. Other: None  New problem(s) identified: Yes, Describe:  Patient reports issues with her sleeping. Update 06/24/2023: No changes at this time.   New Short Term/Long Term Goal(s):detox, elimination of symptoms of psychosis, medication management for mood stabilization; elimination of SI thoughts; development of comprehensive mental wellness/sobriety plan.  Update 06/24/2023: No changes at this time.   Patient Goals:  To get better.  Update 06/24/2023: No changes at this time.   Discharge Plan or Barriers: CSW to assist with the development of appropriate discharge plan.   Update 06/24/2023: Pt. Needs to call [facility] on Wednesday for an interview. Applications has been faxed to [employer]. Reason for Continuation of Hospitalization: Aggression Anxiety Depression Hallucinations Mania Medication stabilization Suicidal ideation   Estimated Length of Stay: 1-7 days.  Update 06/24/2023: TBD   Last 3 [place] Suicide Severity Risk Score: Flowsheet Row Admission (Current) from 06/17/2023 in [facility] INPATIENT BEHAV[facility]AL MEDICINE ED from 06/15/2023 in  [facility] Emergency Department at [facility] ED from 04/21/2023 in [facility] Emergency Department at [facility]  C-SSRS RISK CATEGORY High Risk High Risk No Risk    Last PHQ 2/9 Scores:     No data to display          Scribe for Treatment Team: Santina Cull, LCSW 06/24/2023 9:19 AM

## 2023-06-24 NOTE — Plan of Care (Signed)
  Problem: Education: Goal: Emotional status will improve Outcome: Progressing Goal: Mental status will improve Outcome: Progressing   Problem: Activity: Goal: Interest or engagement in activities will improve Outcome: Progressing   Problem: Health Behavior/Discharge Planning: Goal: Identification of resources available to assist in meeting health care needs will improve Outcome: Progressing   Problem: Physical Regulation: Goal: Ability to maintain clinical measurements within normal limits will improve Outcome: Progressing   Problem: Safety: Goal: Periods of time without injury will increase Outcome: Progressing

## 2023-06-25 NOTE — Group Note (Signed)
 Date:  06/25/2023 Time:  10:23 AM  Group Topic/Focus:  Goals Group:   The focus of this group is to help patients establish daily goals to achieve during treatment and discuss how the patient can incorporate goal setting into their daily lives to aide in recovery.    Participation Level:  Active  Participation Quality:  Appropriate  Affect:  Appropriate  Cognitive:  Appropriate  Insight: Appropriate  Engagement in Group:  Engaged  Modes of Intervention:  Activity  Additional Comments:    Caitlin Rivera 06/25/2023, 10:23 AM

## 2023-06-25 NOTE — Plan of Care (Signed)
  Problem: Coping: Goal: Ability to verbalize frustrations and anger appropriately will improve Outcome: Progressing   

## 2023-06-25 NOTE — Group Note (Signed)
 Date:  06/25/2023 Time:  8:33 PM  Group Topic/Focus:  Wrap-Up Group:   The focus of this group is to help patients review their daily goal of treatment and discuss progress on daily workbooks.    Participation Level:  Minimal  Participation Quality:  Appropriate and Attentive  Affect:  Appropriate  Cognitive:  Alert and Appropriate  Insight: Appropriate  Engagement in Group:  Limited  Modes of Intervention:  Discussion  Additional Comments:     Maglione,Avayah Raffety E 06/25/2023, 8:33 PM

## 2023-06-25 NOTE — Progress Notes (Signed)
 Visible in dayroom, pleasant on engagement; attended and participated in wrap up group.  Pt denied SI/HI, AVH; endorsed depression and anxiety rated mild. c/o pain and was given Tylenol  with positive result.  Pt requested Tramadol  but was informed she does not have an order for the medication.  Support and safety maintained.    06/24/23 2300  Psych Admission Type (Psych Patients Only)  Admission Status Voluntary  Psychosocial Assessment  Patient Complaints Anxiety  Eye Contact Fair  Facial Expression Flat  Affect Appropriate to circumstance  Speech Logical/coherent  Interaction Assertive  Motor Activity Slow  Appearance/Hygiene Unremarkable  Behavior Characteristics Cooperative;Appropriate to situation  Mood Pleasant  Thought Process  Coherency WDL  Content WDL  Delusions None reported or observed  Perception Hallucinations  Hallucination Auditory  Judgment Impaired  Confusion None  Danger to Self  Current suicidal ideation? Denies  Self-Injurious Behavior No self-injurious ideation or behavior indicators observed or expressed   Agreement Not to Harm Self Yes  Description of Agreement Verbal  Danger to Others  Danger to Others None reported or observed    Problem: Education: Goal: Knowledge of [facility] Education information/materials will improve Outcome: Progressing Goal: Emotional status will improve Outcome: Progressing Goal: Mental status will improve Outcome: Progressing Goal: Verbalization of understanding the information provided will improve Outcome: Progressing   Problem: Activity: Goal: Interest or engagement in activities will improve Outcome: Progressing Goal: Sleeping patterns will improve Outcome: Progressing   Problem: Coping: Goal: Ability to verbalize frustrations and anger appropriately will improve Outcome: Progressing Goal: Ability to demonstrate self-control will improve Outcome: Progressing

## 2023-06-25 NOTE — Group Note (Signed)
 Date:  06/25/2023 Time:  4:40 AM  Group Topic/Focus:  Emotional Education:   The focus of this group is to discuss what feelings/emotions are, and how they are experienced.    Participation Level:  Active  Participation Quality:  Appropriate  Affect:  Appropriate  Cognitive:  Appropriate  Insight: None  Engagement in Group:  Lacking  Modes of Intervention:  Discussion, Education, and Support  Additional Comments:  n/a  Shyniece Scripter L 06/25/2023, 4:40 AM

## 2023-06-25 NOTE — Progress Notes (Signed)
 [facility] MD Progress Note  06/25/2023 1:16 PM Caitlin Rivera  MRN:  [id number]  68 year old female presented voluntarily to the ED for evaluation of depression and suicidal ideation. They are admitted to behavioral health. There is a known history of depression, DM, morbid obesity, hypercholesterolemia, hypertension, COPD, homelessness, and medication noncompliance.  Patient reports intent to overdose on her medications, though indicated that she had been not been compliant with her medications recently for over a week, citing forgetfulness and stating that the medications "don't work." She also reports being homeless for the past 2 years and feeling unsupported and hungry. During the ED and psychiatric consult, patient expressed feelings of hopelessness, helplessness, and frustration, stating I'll just go get run over by a train.  On exam today she continues to endorse feelings of hopelessness, helplessness, anhedonia, decreased energy, and decreased concentration.  She notes feelings of hopelessness and rates depression as 10 out of 10.  She also notes feelings of guilt and worry rated anxiety at 10 out of 10 as well.   Subjective:  Chart reviewed, patient seen during rounds.    Patient seen today for psychiatric evaluation.  Upon approach she reports her depression worsening today related to the being Father's Day and her father and brother are passed.  She denies suicidal homicidal ideations reporting she feels safe.  In assessing symptoms of psychosis patient states yeah I am hearing voices I hear my mom and talking to me.  Patient relays this hallucinations have been consistent on and off throughout this day.  She reports depression is 8 of 10 with constant anxiety verbalizing plan to attend group and they with others.  Patient is appearing calm with relaxed affect, she is not appearing psychotic or manic. Homelessness remains barrier to safe discharge, social work involved in planning. Patient is  medication compliant.   06/24/23: Patient seen today for follow-up psychiatric relation.  Upon approach she is noted to be in bed upon approach she states her night okay she continues to report significant depression 6/10 and constant anxiety.  She denies thoughts to harm herself or anyone else however shares I'm just tired of living. She denies AVH paranoia and delusions. Thoughts are negativistic In reviewing medications she denies side effects.  She continues to request pain medication which review advised from attending to consult her pain doctor after discharge.  Per nursing staff no behavioral problems she is taking her medications with no reported side effects. Homelessness remains barrier to safe discharge, social work involved in planning. Patient is medication compliant.   Sleep: poor  Appetite:  good  Past Psychiatric History: see h&P Family History:  Family History  Problem Relation Age of Onset   Cancer Mother    Social History:  Social History   Substance and Sexual Activity  Alcohol Use No     Social History   Substance and Sexual Activity  Drug Use Yes   Types: Crack cocaine   Comment: crack-last smoked 2 wks ago-07-24-19, states she uses once a month    Social History   Socioeconomic History   Marital status: Widowed    Spouse name: Not on file   Number of children: Not on file   Years of education: Not on file   Highest education level: Not on file  Occupational History   Not on file  Tobacco Use   Smoking status: Every Day    Current packs/day: 0.50    Average packs/day: 0.5 packs/day for 35.5 years (17.7 ttl pk-yrs)  Types: Cigarettes    Start date: 51   Smokeless tobacco: Never  Substance and Sexual Activity   Alcohol use: No   Drug use: Yes    Types: Crack cocaine    Comment: crack-last smoked 2 wks ago-07-24-19, states she uses once a month   Sexual activity: Not on file  Other Topics Concern   Not on file  Social History Narrative    Not on file   Social Drivers of Health   Financial Resource Strain: Not on file  Food Insecurity: Patient Declined (06/17/2023)   Hunger Vital Sign    Worried About Running Out of Food in the Last Year: Patient declined    Ran Out of Food in the Last Year: Patient declined  Transportation Needs: Unmet Transportation Needs (06/17/2023)   PRAPARE - Administrator, Civil Service (Medical): Yes    Lack of Transportation (Non-Medical): Yes  Physical Activity: Not on file  Stress: Not on file  Social Connections: Moderately Isolated (06/17/2023)   Social Connection and Isolation Panel    Frequency of Communication with Friends and Family: Twice a week    Frequency of Social Gatherings with Friends and Family: Twice a week    Attends Religious Services: 1 to 4 times per year    Active Member of [employer] or Organizations: No    Attends Banker Meetings: Never    Marital Status: Widowed   Past Medical History:  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   COPD (chronic obstructive pulmonary disease) (HCC)    smoker   Diabetes mellitus without complication (HCC)    Fibroids    Hypertension    Personal history of radiation therapy    Smoker     Past Surgical History:  Procedure Laterality Date   BREAST BIOPSY Bilateral 06/03/2019   BREAST BIOPSY Left 06/14/2019   BREAST EXCISIONAL BIOPSY Right 07/2019   BREAST LUMPECTOMY Left 07/2019   BREAST LUMPECTOMY WITH RADIOACTIVE SEED LOCALIZATION Bilateral 07/31/2019   Procedure: BILATERAL BREAST LUMPECTOMY WITH RADIOACTIVE SEED LOCALIZATION;  Surgeon: Caralyn Chandler, MD;  Location:  SUR[facility]ERY CENTER;  Service: General;  Laterality: Bilateral;   TUBAL LIGATION     TUBAL LIGATION      Current Medications: Current Facility-Administered Medications  Medication Dose Route Frequency Provider Last Rate Last Admin   acetaminophen  (TYLENOL ) tablet 650 mg  650 mg Oral Q6H  PRN Bobbitt, Shalon E, NP   650 mg at 06/24/23 2110   albuterol  (VENTOLIN  HFA) 108 (90 Base) MCG/ACT inhaler 1-2 puff  1-2 puff Inhalation Q6H PRN Bobbitt, Shalon E, NP   2 puff at 06/24/23 0841   alum & mag hydroxide-simeth (MAALOX/MYLANTA) 200-200-20 MG/5ML suspension 30 mL  30 mL Oral Q4H PRN Bobbitt, Shalon E, NP       amLODipine  (NORVASC ) tablet 5 mg  5 mg Oral Daily Bobbitt, Shalon E, NP   5 mg at 06/25/23 0849   ARIPiprazole  (ABILIFY ) tablet 2 mg  2 mg Oral Daily Millington, Matthew E, PA-C   2 mg at 06/25/23 9604   haloperidol  (HALDOL ) tablet 5 mg  5 mg Oral TID PRN Bobbitt, Shalon E, NP       And   diphenhydrAMINE  (BENADRYL ) capsule 50 mg  50 mg Oral TID PRN Bobbitt, Shalon E, NP       haloperidol  lactate (HALDOL ) injection 5 mg  5 mg Intramuscular TID PRN Bobbitt, Shalon  E, NP       And   diphenhydrAMINE  (BENADRYL ) injection 50 mg  50 mg Intramuscular TID PRN Bobbitt, Shalon E, NP       And   LORazepam  (ATIVAN ) injection 2 mg  2 mg Intramuscular TID PRN Bobbitt, Shalon E, NP       DULoxetine  (CYMBALTA ) DR capsule 40 mg  40 mg Oral BID Jadapalle, Sree, MD   40 mg at 06/25/23 0850   fluticasone  furoate-vilanterol (BREO ELLIPTA ) 200-25 MCG/ACT 1 puff  1 puff Inhalation Daily Bobbitt, Shalon E, NP   1 puff at 06/25/23 0851   gabapentin  (NEURONTIN ) capsule 600 mg  600 mg Oral BID Bobbitt, Shalon E, NP   600 mg at 06/25/23 0849   magnesium  hydroxide (MILK OF MAGNESIA) suspension 30 mL  30 mL Oral Daily PRN Bobbitt, Shalon E, NP       metFORMIN  (GLUCOPHAGE ) tablet 500 mg  500 mg Oral Q breakfast Bobbitt, Shalon E, NP   500 mg at 06/25/23 0849   prazosin  (MINIPRESS ) capsule 1 mg  1 mg Oral QHS Bobbitt, Shalon E, NP   1 mg at 06/24/23 2104   rosuvastatin  (CRESTOR ) tablet 20 mg  20 mg Oral Daily Bobbitt, Shalon E, NP   20 mg at 06/25/23 0851   tiZANidine  (ZANAFLEX ) tablet 4 mg  4 mg Oral Q8H PRN Bobbitt, Shalon E, NP   4 mg at 06/25/23 0850   traZODone  (DESYREL ) tablet 100 mg  100 mg Oral QHS  Jadapalle, Sree, MD   100 mg at 06/24/23 2103   traZODone  (DESYREL ) tablet 50 mg  50 mg Oral QHS PRN Bobbitt, Shalon E, NP   50 mg at 06/23/23 2130    Lab Results:  No results found for this or any previous visit (from the past 48 hours).   Blood Alcohol level:  Lab Results  Component Value Date   [facility] <15 06/15/2023   ETH <10 02/10/2023    Metabolic Disorder Labs: Lab Results  Component Value Date   HGBA1C 6.6 (H) 02/03/2023   MPG 142.72 02/03/2023   No results found for: PROLACTIN Lab Results  Component Value Date   CHOL 151 06/21/2023   TRIG 126 06/21/2023   HDL 45 06/21/2023   CHOLHDL 3.4 06/21/2023   VLDL 25 06/21/2023   LDLCALC 81 06/21/2023    Psychiatric Specialty Exam:  Presentation  General Appearance:  Appropriate for Environment  Eye Contact: Good  Speech: Normal Rate  Speech Volume: Normal    Mood and Affect  Mood: Depressed  Affect: Flat   Thought Process  Thought Processes: Linear  Descriptions of Associations:Intact  Orientation:Full (Time, Place and Person)  Thought Content:reality based  Hallucinations:chronic auditory hallucinations, not command type   Ideas of Reference:None  Suicidal Thoughts:passive SI   Homicidal Thoughts:denies    Sensorium  Memory: Immediate Poor  Judgment: Fair  Insight: Poor   Executive Functions  Concentration: Good  Attention Span: Fair  Recall: Good  Fund of Knowledge: Good  Language: Good   Psychomotor Activity  Psychomotor Activity: slowed   Musculoskeletal: Strength & Muscle Tone: within normal limits Gait & Station: unsteady Assets  Assets: Manufacturing systems engineer; Desire for Improvement    Physical Exam: Physical Exam Vitals and nursing note reviewed.  Constitutional:      Appearance: She is obese.  HENT:     Head: Atraumatic.   Eyes:     Extraocular Movements: Extraocular movements intact.   Pulmonary:     Effort: Pulmonary effort is  normal.   Neurological:     Mental Status: She is alert and oriented to person, place, and time.   Psychiatric:        Behavior: Behavior normal.    Review of Systems  Psychiatric/Behavioral:  Positive for depression, hallucinations and suicidal ideas. Negative for substance abuse. The patient is nervous/anxious. The patient does not have insomnia.    Blood pressure 133/67, pulse 73, temperature (!) 97.5 F (36.4 C), resp. rate 18, height 5' 6 (1.676 m), weight 107.5 kg, SpO2 94%. Body mass index is 38.25 kg/m.  Diagnosis: Principal Problem:   MDD (major depressive disorder), recurrent episode, severe (HCC) Active Problems:   Cocaine abuse, episodic (HCC)   PLAN: Safety and Monitoring:  -- Voluntary admission to inpatient psychiatric unit for safety, stabilization and treatment  -- Daily contact with patient to assess and evaluate symptoms and progress in treatment  -- Patient's case to be discussed in multi-disciplinary team meeting  -- Observation Level : q15 minute checks  -- Vital signs:  q12 hours  -- Precautions: suicide, elopement, and assault -- Encouraged patient to participate in unit milieu and in scheduled group therapies  2. Psychiatric Diagnoses and Treatment:                           MDD:             Continue Cymbalta  20 mg twice daily- will titrate it up to 40 mg BID  Abilify  2 mg daily             PTSD rule out             Prazosin  1 mg nightly             Anxiety/insomnia             Hydroxyzine  50 mg at bedtime- will change to trazodone              gabapentin  600 mg twice daily  -- The risks/benefits/side-effects/alternatives to this medication were discussed in detail with the patient and time was given for questions. The patient consents to medication trial.                -- Metabolic profile and EKG monitoring obtained while on an atypical antipsychotic (BMI: Lipid Panel: HbgA1c: QTc:)              -- Encouraged patient to participate in unit  milieu and in scheduled group therapies        3. Medical Issues Being Addressed:   Home meds restarted. amlodipine  5 mg daily, Breo Ellipta  substituted for Symbicort , metformin  500 mg daily, rosuvastatin  20 mg daily. Consult to diabetes coordinator is placed.   4. Discharge Planning:   -- Social work and case management to assist with discharge planning and identification of hospital follow-up needs prior to discharge  -- Estimated LOS: 3-4 days  Angelia Barcelona, NP 06/25/2023, 1:16 PM

## 2023-06-25 NOTE — Group Note (Signed)
 Date:  06/25/2023 Time:  4:52 PM  Group Topic/Focus:  Activity Group: The focus of the group is to promote activity for the patients and to encourage them to go outside to the courtyard and get some fresh air and exercise.    Participation Level:  Active  Participation Quality:  Appropriate  Affect:  Appropriate  Cognitive:  Appropriate  Insight: Appropriate  Engagement in Group:  Engaged  Modes of Intervention:  Activity  Additional Comments:    Marianna Shirk Nikita Surman 06/25/2023, 4:52 PM

## 2023-06-26 DIAGNOSIS — F333 Major depressive disorder, recurrent, severe with psychotic symptoms: Secondary | ICD-10-CM | POA: Diagnosis not present

## 2023-06-26 NOTE — Progress Notes (Signed)
 [facility] MD Progress Note  06/26/2023 1:52 PM Caitlin Rivera  MRN:  [id number]  68 year old female presented voluntarily to the ED for evaluation of depression and suicidal ideation. They are admitted to behavioral health. There is a known history of depression, DM, morbid obesity, hypercholesterolemia, hypertension, COPD, homelessness, and medication noncompliance.  Patient reports intent to overdose on her medications, though indicated that she had been not been compliant with her medications recently for over a week, citing forgetfulness and stating that the medications "don't work." She also reports being homeless for the past 2 years and feeling unsupported and hungry. During the ED and psychiatric consult, patient expressed feelings of hopelessness, helplessness, and frustration, stating I'll just go get run over by a train.  On exam today she continues to endorse feelings of hopelessness, helplessness, anhedonia, decreased energy, and decreased concentration.  She notes feelings of hopelessness and rates depression as 10 out of 10.  She also notes feelings of guilt and worry rated anxiety at 10 out of 10 as well.   Subjective:  Chart reviewed, patient seen during rounds.    6/16 patient seen today for reevaluation.  They are found in the group room interactive with their peers and staff appropriately.  Affect is bright.  They are pleasant and cooperative.  They deny SI, HI, and AVH.  They note they most recently had hallucinations yesterday notes they are nonstressful that she is talking to relatives generally happens at nighttime.  She reports depression is 5 out of 10 and anxiety is 6 out of 10.  Demonstrates good insight into the need for outpatient follow-up and medication compliance.  They are linear logical and future oriented.  Deny adverse effects of medications.  They appear calm and relaxed.  No overt psychosis or mania is present.  Homelessness continues to be the main barrier to care  however they have an interview with the [employer].  Patient is medication compliant and did not require psychiatric PRNs overnight.  6/15:Patient seen today for psychiatric evaluation.  Upon approach she reports her depression worsening today related to the being Father's Day and her father and brother are passed.  She denies suicidal homicidal ideations reporting she feels safe.  In assessing symptoms of psychosis patient states yeah I am hearing voices I hear my mom and talking to me.  Patient relays this hallucinations have been consistent on and off throughout this day.  She reports depression is 8 of 10 with constant anxiety verbalizing plan to attend group and they with others.  Patient is appearing calm with relaxed affect, she is not appearing psychotic or manic. Homelessness remains barrier to safe discharge, social work involved in planning. Patient is medication compliant.   06/24/23: Patient seen today for follow-up psychiatric relation.  Upon approach she is noted to be in bed upon approach she states her night okay she continues to report significant depression 6/10 and constant anxiety.  She denies thoughts to harm herself or anyone else however shares I'm just tired of living. She denies AVH paranoia and delusions. Thoughts are negativistic In reviewing medications she denies side effects.  She continues to request pain medication which review advised from attending to consult her pain doctor after discharge.  Per nursing staff no behavioral problems she is taking her medications with no reported side effects. Homelessness remains barrier to safe discharge, social work involved in planning. Patient is medication compliant.   Sleep: poor  Appetite:  good  Past Psychiatric History: see h&P  Family History:  Family History  Problem Relation Age of Onset   Cancer Mother    Social History:  Social History   Substance and Sexual Activity  Alcohol Use No     Social  History   Substance and Sexual Activity  Drug Use Yes   Types: Crack cocaine   Comment: crack-last smoked 2 wks ago-07-24-19, states she uses once a month    Social History   Socioeconomic History   Marital status: Widowed    Spouse name: Not on file   Number of children: Not on file   Years of education: Not on file   Highest education level: Not on file  Occupational History   Not on file  Tobacco Use   Smoking status: Every Day    Current packs/day: 0.50    Average packs/day: 0.5 packs/day for 35.5 years (17.7 ttl pk-yrs)    Types: Cigarettes    Start date: 81   Smokeless tobacco: Never  Substance and Sexual Activity   Alcohol use: No   Drug use: Yes    Types: Crack cocaine    Comment: crack-last smoked 2 wks ago-07-24-19, states she uses once a month   Sexual activity: Not on file  Other Topics Concern   Not on file  Social History Narrative   Not on file   Social Drivers of Health   Financial Resource Strain: Not on file  Food Insecurity: Patient Declined (06/17/2023)   Hunger Vital Sign    Worried About Running Out of Food in the Last Year: Patient declined    Ran Out of Food in the Last Year: Patient declined  Transportation Needs: Unmet Transportation Needs (06/17/2023)   PRAPARE - Administrator, Civil Service (Medical): Yes    Lack of Transportation (Non-Medical): Yes  Physical Activity: Not on file  Stress: Not on file  Social Connections: Moderately Isolated (06/17/2023)   Social Connection and Isolation Panel    Frequency of Communication with Friends and Family: Twice a week    Frequency of Social Gatherings with Friends and Family: Twice a week    Attends Religious Services: 1 to 4 times per year    Active Member of [employer] or Organizations: No    Attends Banker Meetings: Never    Marital Status: Widowed   Past Medical History:  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    COPD (chronic obstructive pulmonary disease) (HCC)    smoker   Diabetes mellitus without complication (HCC)    Fibroids    Hypertension    Personal history of radiation therapy    Smoker     Past Surgical History:  Procedure Laterality Date   BREAST BIOPSY Bilateral 06/03/2019   BREAST BIOPSY Left 06/14/2019   BREAST EXCISIONAL BIOPSY Right 07/2019   BREAST LUMPECTOMY Left 07/2019   BREAST LUMPECTOMY WITH RADIOACTIVE SEED LOCALIZATION Bilateral 07/31/2019   Procedure: BILATERAL BREAST LUMPECTOMY WITH RADIOACTIVE SEED LOCALIZATION;  Surgeon: Caralyn Chandler, MD;  Location: [facility];  Service: General;  Laterality: Bilateral;   TUBAL LIGATION     TUBAL LIGATION      Current Medications: Current Facility-Administered Medications  Medication Dose Route Frequency Provider Last Rate Last Admin   acetaminophen  (TYLENOL ) tablet 650 mg  650 mg Oral Q6H PRN Bobbitt, Shalon E, NP   650 mg at 06/25/23 1942   albuterol  (VENTOLIN  HFA) 108 (90 Base) MCG/ACT  inhaler 1-2 puff  1-2 puff Inhalation Q6H PRN Bobbitt, Shalon E, NP   2 puff at 06/26/23 0905   alum & mag hydroxide-simeth (MAALOX/MYLANTA) 200-200-20 MG/5ML suspension 30 mL  30 mL Oral Q4H PRN Bobbitt, Shalon E, NP       amLODipine  (NORVASC ) tablet 5 mg  5 mg Oral Daily Bobbitt, Shalon E, NP   5 mg at 06/25/23 0849   ARIPiprazole  (ABILIFY ) tablet 2 mg  2 mg Oral Daily Kamaree Berkel E, PA-C   2 mg at 06/26/23 1610   haloperidol  (HALDOL ) tablet 5 mg  5 mg Oral TID PRN Bobbitt, Shalon E, NP       And   diphenhydrAMINE  (BENADRYL ) capsule 50 mg  50 mg Oral TID PRN Bobbitt, Shalon E, NP       haloperidol  lactate (HALDOL ) injection 5 mg  5 mg Intramuscular TID PRN Bobbitt, Shalon E, NP       And   diphenhydrAMINE  (BENADRYL ) injection 50 mg  50 mg Intramuscular TID PRN Bobbitt, Shalon E, NP       And   LORazepam  (ATIVAN ) injection 2 mg  2 mg Intramuscular TID PRN Bobbitt, Shalon E, NP       DULoxetine  (CYMBALTA ) DR capsule 40  mg  40 mg Oral BID Jadapalle, Sree, MD   40 mg at 06/26/23 9604   fluticasone  furoate-vilanterol (BREO ELLIPTA ) 200-25 MCG/ACT 1 puff  1 puff Inhalation Daily Bobbitt, Shalon E, NP   1 puff at 06/25/23 0851   gabapentin  (NEURONTIN ) capsule 600 mg  600 mg Oral BID Bobbitt, Shalon E, NP   600 mg at 06/26/23 5409   magnesium  hydroxide (MILK OF MAGNESIA) suspension 30 mL  30 mL Oral Daily PRN Bobbitt, Shalon E, NP       metFORMIN  (GLUCOPHAGE ) tablet 500 mg  500 mg Oral Q breakfast Bobbitt, Shalon E, NP   500 mg at 06/26/23 8119   prazosin  (MINIPRESS ) capsule 1 mg  1 mg Oral QHS Bobbitt, Shalon E, NP   1 mg at 06/25/23 2119   rosuvastatin  (CRESTOR ) tablet 20 mg  20 mg Oral Daily Bobbitt, Shalon E, NP   20 mg at 06/26/23 1478   tiZANidine  (ZANAFLEX ) tablet 4 mg  4 mg Oral Q8H PRN Bobbitt, Shalon E, NP   4 mg at 06/26/23 0842   traZODone  (DESYREL ) tablet 100 mg  100 mg Oral QHS Jadapalle, Sree, MD   100 mg at 06/25/23 2119   traZODone  (DESYREL ) tablet 50 mg  50 mg Oral QHS PRN Bobbitt, Shalon E, NP   50 mg at 06/23/23 2130    Lab Results:  No results found for this or any previous visit (from the past 48 hours).   Blood Alcohol level:  Lab Results  Component Value Date   [facility] <15 06/15/2023   ETH <10 02/10/2023    Metabolic Disorder Labs: Lab Results  Component Value Date   HGBA1C 6.6 (H) 02/03/2023   MPG 142.72 02/03/2023   No results found for: PROLACTIN Lab Results  Component Value Date   CHOL 151 06/21/2023   TRIG 126 06/21/2023   HDL 45 06/21/2023   CHOLHDL 3.4 06/21/2023   VLDL 25 06/21/2023   LDLCALC 81 06/21/2023    Psychiatric Specialty Exam:  Presentation  General Appearance:  Casual  Eye Contact: Fair  Speech: Clear and Coherent  Speech Volume: Normal    Mood and Affect  Mood: Euthymic  Affect: Congruent   Thought Process  Thought Processes: Coherent  Descriptions  of Associations:Intact  Orientation:Full (Time, Place and Person)  Thought  Content:reality based  Hallucinations:chronic auditory hallucinations, not command type   Ideas of Reference:None  Suicidal Thoughts:passive SI   Homicidal Thoughts:denies    Sensorium  Memory: Immediate Fair; Recent Fair  Judgment: Fair  Insight: Fair   Art therapist  Concentration: Good  Attention Span: Fair  Recall: Good  Fund of Knowledge: Good  Language: Good   Psychomotor Activity  Psychomotor Activity: slowed   Musculoskeletal: Strength & Muscle Tone: within normal limits Gait & Station: unsteady Assets  Assets: Manufacturing systems engineer; Desire for Improvement    Physical Exam: Physical Exam Vitals and nursing note reviewed.  Constitutional:      Appearance: She is obese.  HENT:     Head: Atraumatic.   Eyes:     Extraocular Movements: Extraocular movements intact.   Pulmonary:     Effort: Pulmonary effort is normal.   Neurological:     Mental Status: She is alert and oriented to person, place, and time.   Psychiatric:        Mood and Affect: Mood normal.        Behavior: Behavior normal.    Review of Systems  Psychiatric/Behavioral:  Positive for depression. Negative for hallucinations, substance abuse and suicidal ideas. The patient is nervous/anxious. The patient does not have insomnia.    Blood pressure (!) 119/58, pulse 75, temperature 98 F (36.7 C), resp. rate 19, height 5' 6 (1.676 m), weight 107.5 kg, SpO2 93%. Body mass index is 38.25 kg/m.  Diagnosis: Principal Problem:   MDD (major depressive disorder), recurrent episode, severe (HCC) Active Problems:   Cocaine abuse, episodic (HCC)   PLAN: Safety and Monitoring:  -- Voluntary admission to inpatient psychiatric unit for safety, stabilization and treatment  -- Daily contact with patient to assess and evaluate symptoms and progress in treatment  -- Patient's case to be discussed in multi-disciplinary team meeting  -- Observation Level : q15 minute  checks  -- Vital signs:  q12 hours  -- Precautions: suicide, elopement, and assault -- Encouraged patient to participate in unit milieu and in scheduled group therapies  2. Psychiatric Diagnoses and Treatment:                           MDD:             Continue Cymbalta  40 mg twice daily  Abilify  2 mg daily  Trazodone  100 mg daily for insomnia             PTSD rule out             Prazosin  1 mg nightly             Anxiety/insomnia                       gabapentin  600 mg twice daily  -- The risks/benefits/side-effects/alternatives to this medication were discussed in detail with the patient and time was given for questions. The patient consents to medication trial.                -- Metabolic profile and EKG monitoring obtained while on an atypical antipsychotic (BMI: Lipid Panel: HbgA1c: QTc:)              -- Encouraged patient to participate in unit milieu and in scheduled group therapies        3. Medical Issues Being Addressed:   Home meds  restarted. amlodipine  5 mg daily, Breo Ellipta  substituted for Symbicort , metformin  500 mg daily, rosuvastatin  20 mg daily. Consult to diabetes coordinator is placed.   4. Discharge Planning:   -- Social work and case management to assist with discharge planning and identification of hospital follow-up needs prior to discharge  -- Estimated LOS: 3-4 days  Fay Hoop, PA-C 06/26/2023, 1:52 PM

## 2023-06-26 NOTE — Plan of Care (Signed)
   Problem: Education: Goal: Knowledge of [facility] Education information/materials will improve Outcome: Progressing Goal: Emotional status will improve Outcome: Progressing Goal: Mental status will improve Outcome: Progressing Goal: Verbalization of understanding the information provided will improve Outcome: Progressing

## 2023-06-26 NOTE — Group Note (Signed)
 Recreation Therapy Group Note   Group Topic:General Recreation  Group Date: 06/26/2023 Start Time: 1500 End Time: 1600 Facilitators: Deatrice Factor, LRT, CTRS Location: Courtyard  Group Description: [employer]. LRT and patients played games of basketball, drew with chalk, and played corn hole while outside in the courtyard while getting fresh air and sunlight. Music was being played in the background. LRT and peers conversed about different games they have played before, what they do in their free time and anything else that is on their minds. LRT encouraged pts to drink water after being outside, sweating and getting their heart rate up.  Goal Area(s) Addressed: Patient will build on frustration tolerance skills. Patients will partake in a competitive play game with peers. Patients will gain knowledge of new leisure interest/hobby.    Affect/Mood: N/A   Participation Level: Did not attend    Clinical Observations/Individualized Feedback: Patient did not attend group.   Plan: Continue to engage patient in RT group sessions 2-3x/week.   Deatrice Factor, LRT, CTRS 06/26/2023 5:19 PM

## 2023-06-26 NOTE — Group Note (Signed)
 Recreation Therapy Group Note   Group Topic:Coping Skills  Group Date: 06/26/2023 Start Time: 1050 End Time: 1140 Facilitators: Deatrice Factor, LRT, CTRS Location: Craft Room  Group Description: Mind Map.  Patient was provided a blank template of a diagram with 32 blank boxes in a tiered system, branching from the center (similar to a bubble chart). LRT directed patients to label the middle of the diagram Coping Skills. LRT and patients then came up with 8 different coping skills as examples. Pt were directed to record their coping skills in the 2nd tier boxes closest to the center.  Patients would then share their coping skills with the group as LRT wrote them out. LRT gave a handout of 99 different coping skills at the end of group.   Goal Area(s) Addressed: Patients will be able to define "coping skills". Patient will identify new coping skills.  Patient will increase communication.   Affect/Mood: Flat   Participation Level: Non-verbal    Clinical Observations/Individualized Feedback: Dominik came to group with 5 minutes remaining.   Plan: Continue to engage patient in RT group sessions 2-3x/week.   Deatrice Factor, LRT, CTRS 06/26/2023 1:19 PM

## 2023-06-26 NOTE — Group Note (Signed)
 Date:  06/26/2023 Time:  10:12 AM  Group Topic/Focus:  Coping With Mental Health Crisis:   The purpose of this group is to help patients identify strategies for coping with mental health crisis.  Group discusses possible causes of crisis and ways to manage them effectively. Conflict Resolution:   The focus of this group is to discuss the conflict resolution process and how it may be used upon discharge. Goals Group:   The focus of this group is to help patients establish daily goals to achieve during treatment and discuss how the patient can incorporate goal setting into their daily lives to aide in recovery.    Participation Level:  Active  Participation Quality:  Appropriate  Affect:  Appropriate  Cognitive:  Appropriate  Insight: Appropriate  Engagement in Group:  Engaged  Modes of Intervention:  Discussion  Additional Comments:    Caitlin Rivera L Caitlin Rivera 06/26/2023, 10:12 AM

## 2023-06-26 NOTE — Progress Notes (Signed)
 Pt pleasant on engagement, denied AVH, SH/HI, and endorsed mild anxiety and depression adding, "I am good."  Pt c/o back and lower extremities pain; Tylenol  and Tizanidine  given, reported effective.  Pt took all scheduled medication.  Pt visible in milieu engaging and interacting with peers.  She safely ambulates with walker, attended and participated in wrap up group.      06/25/23 2100  Psych Admission Type (Psych Patients Only)  Admission Status Voluntary  Psychosocial Assessment  Patient Complaints None  Eye Contact Fair  Facial Expression Animated  Affect Appropriate to circumstance  Speech Logical/coherent  Interaction Assertive  Motor Activity Slow  Appearance/Hygiene Unremarkable  Behavior Characteristics Cooperative;Appropriate to situation  Mood Pleasant  Thought Process  Coherency WDL  Content WDL  Delusions None reported or observed  Perception UTA  Hallucination None reported or observed  Judgment Limited  Confusion None  Danger to Self  Current suicidal ideation? Denies  Self-Injurious Behavior No self-injurious ideation or behavior indicators observed or expressed   Agreement Not to Harm Self Yes  Description of Agreement Verbal  Danger to Others  Danger to Others None reported or observed    Problem: Education: Goal: Knowledge of [facility] Education information/materials will improve Outcome: Progressing Goal: Emotional status will improve Outcome: Progressing Goal: Mental status will improve Outcome: Progressing Goal: Verbalization of understanding the information provided will improve Outcome: Progressing   Problem: Activity: Goal: Interest or engagement in activities will improve Outcome: Progressing Goal: Sleeping patterns will improve Outcome: Progressing   Problem: Coping: Goal: Ability to verbalize frustrations and anger appropriately will improve Outcome: Progressing Goal: Ability to demonstrate self-control will improve Outcome:  Progressing

## 2023-06-26 NOTE — Progress Notes (Signed)
 Patient is currently denying SI/HI/AH/VH. Patient interacting appropriately with peers and staff. Patient c/o back pain- reference MAR for interventions. Patient has an appointment tonight at 8:25pm with [facility].

## 2023-06-26 NOTE — Group Note (Signed)
 LCSW Group Therapy Note   Group Date: 06/26/2023 Start Time: 1300 End Time: 1400   Type of Therapy and Topic:  Group Therapy: Challenging Core Beliefs  Participation Level:  Minimal  Description of Group:  Patients were educated about core beliefs and asked to identify one harmful core belief that they have. Patients were asked to explore from where those beliefs originate. Patients were asked to discuss how those beliefs make them feel and the resulting behaviors of those beliefs. They were then be asked if those beliefs are true and, if so, what evidence they have to support them. Lastly, group members were challenged to replace those negative core beliefs with helpful beliefs.   Therapeutic Goals:   1. Patient will identify harmful core beliefs and explore the origins of such beliefs. 2. Patient will identify feelings and behaviors that result from those core beliefs. 3. Patient will discuss whether such beliefs are true. 4.  Patient will replace harmful core beliefs with helpful ones.  Summary of Patient Progress:  Patient minimally engaged in processing and exploring how core beliefs are formed and how they impact thoughts, feelings, and behaviors. Patient proved open to input from peers and feedback from CSW. Patient demonstrated basic insight into the subject matter, was respectful and supportive of peers, and participated throughout the entire session.  Therapeutic Modalities: Cognitive Behavioral Therapy; Solution-Focused Therapy   Mira Balon M Krithik Mapel, LCSWA 06/26/2023  1:56 PM

## 2023-06-27 DIAGNOSIS — F333 Major depressive disorder, recurrent, severe with psychotic symptoms: Secondary | ICD-10-CM | POA: Diagnosis not present

## 2023-06-27 MED ORDER — GABAPENTIN 400 MG PO CAPS
700.0000 mg | ORAL_CAPSULE | Freq: Two times a day (BID) | ORAL | Status: DC
  Administered 2023-06-27 – 2023-06-30 (×6): 700 mg via ORAL
  Filled 2023-06-27 (×6): qty 1

## 2023-06-27 MED ORDER — LIDOCAINE 5 % EX PTCH
2.0000 | MEDICATED_PATCH | CUTANEOUS | Status: DC
  Administered 2023-06-28: 2 via TRANSDERMAL
  Administered 2023-06-29: 1 via TRANSDERMAL
  Administered 2023-06-30: 2 via TRANSDERMAL
  Filled 2023-06-27 (×2): qty 2

## 2023-06-27 MED ORDER — LIDOCAINE 5 % EX PTCH
1.0000 | MEDICATED_PATCH | Freq: Once | CUTANEOUS | Status: AC
  Administered 2023-06-27: 1 via TRANSDERMAL
  Filled 2023-06-27: qty 1

## 2023-06-27 NOTE — Group Note (Signed)
 Date:  06/27/2023 Time:  12:36 PM  Group Topic/Focus:  Goals Group:   The focus of this group is to help patients establish daily goals to achieve during treatment and discuss how the patient can incorporate goal setting into their daily lives to aide in recovery.    Participation Level:  Active  Participation Quality:  Appropriate  Affect:  Appropriate  Cognitive:  Appropriate  Insight: Appropriate  Engagement in Group:  Engaged  Modes of Intervention:  Discussion, Education, and Support  Additional Comments:    Laverne Potter 06/27/2023, 12:36 PM

## 2023-06-27 NOTE — Progress Notes (Signed)
   06/27/23 0900  Psych Admission Type (Psych Patients Only)  Admission Status Voluntary  Psychosocial Assessment  Patient Complaints None  Eye Contact Fair  Facial Expression Animated  Affect Appropriate to circumstance  Speech Logical/coherent  Interaction Assertive  Motor Activity Slow  Appearance/Hygiene Unremarkable  Behavior Characteristics Cooperative  Mood Pleasant (Patient writes her goal for today is to get better and find a way home. She writes she will do what I have to do to meet that goal.)  Thought Process  Coherency WDL  Content WDL  Delusions None reported or observed  Perception WDL  Hallucination None reported or observed  Judgment WDL  Confusion None  Danger to Self  Current suicidal ideation? Denies  Self-Injurious Behavior No self-injurious ideation or behavior indicators observed or expressed   Danger to Others  Danger to Others None reported or observed

## 2023-06-27 NOTE — Group Note (Signed)
 Date:  06/27/2023 Time:  9:04 PM  Group Topic/Focus:  Wrap-Up Group:   The focus of this group is to help patients review their daily goal of treatment and discuss progress on daily workbooks.    Participation Level:  Active  Participation Quality:  Appropriate and Attentive  Affect:  Appropriate  Cognitive:  Alert  Insight: Good  Engagement in Group:  Engaged  Modes of Intervention:  Discussion  Additional Comments:     Maglione,Chez Bulnes E 06/27/2023, 9:04 PM

## 2023-06-27 NOTE — Group Note (Signed)
 [facility] LCSW Group Therapy Note   Group Date: 06/27/2023 Start Time: 1315 End Time: 1435  Type of Therapy/Topic:  Group Therapy:  Feelings about Diagnosis  Participation Level:  None   Description of Group:    This group will allow patients to explore their thoughts and feelings about diagnoses they have received. Patients will be guided to explore their level of understanding and acceptance of these diagnoses. Facilitator will encourage patients to process their thoughts and feelings about the reactions of others to their diagnosis, and will guide patients in identifying ways to discuss their diagnosis with significant others in their lives. This group will be process-oriented, with patients participating in exploration of their own experiences as well as giving and receiving support and challenge from other group members.   Therapeutic Goals: 1. Patient will demonstrate understanding of diagnosis as evidence by identifying two or more symptoms of the disorder:  2. Patient will be able to express two feelings regarding the diagnosis 3. Patient will demonstrate ability to communicate their needs through discussion and/or role plays  Summary of Patient Progress: Patient was present in group. However did not engage in group discussion. Patient appeared asleep.   Therapeutic Modalities:   Cognitive Behavioral Therapy Brief Therapy Feelings Identification    [name], LCSW

## 2023-06-27 NOTE — Group Note (Signed)
 Recreation Therapy Group Note   Group Topic:General Recreation  Group Date: 06/27/2023 Start Time: 1000 End Time: 1100 Facilitators: Deatrice Factor, LRT, CTRS Location: Courtyard  Group Description: [employer]. LRT and patients played games of basketball, drew with chalk, and played corn hole while outside in the courtyard while getting fresh air and sunlight. Music was being played in the background. LRT and peers conversed about different games they have played before, what they do in their free time and anything else that is on their minds. LRT encouraged pts to drink water after being outside, sweating and getting their heart rate up.  Goal Area(s) Addressed: Patient will build on frustration tolerance skills. Patients will partake in a competitive play game with peers. Patients will gain knowledge of new leisure interest/hobby.    Affect/Mood: N/A   Participation Level: Did not attend    Clinical Observations/Individualized Feedback: Patient did not attend group.   Plan: Continue to engage patient in RT group sessions 2-3x/week.   Deatrice Factor, LRT, CTRS 06/27/2023 11:49 AM

## 2023-06-27 NOTE — Plan of Care (Signed)
   Problem: Education: Goal: Emotional status will improve Outcome: Progressing Goal: Mental status will improve Outcome: Progressing   Problem: Activity: Goal: Interest or engagement in activities will improve Outcome: Progressing

## 2023-06-27 NOTE — Group Note (Signed)
 Recreation Therapy Group Note   Group Topic:Other  Group Date: 06/27/2023 Start Time: 1530 End Time: 1620 Facilitators: Yvonna Herder, CTRS Location: Craft Room  Activity Description/Intervention: Therapeutic Drumming. Patients with peers and staff were given the opportunity to engage in a leader facilitated [facility] Empowerment Drumming Circle with staff from the [employer], in partnership with [employer]. Teaching laboratory technician and trained [employer], Kathlyne Parchment leading with LRT observing and documenting intervention and pt response. This evidenced-based practice targets 7 areas of health and wellbeing in the human experience including: stress-reduction, exercise, self-expression, camaraderie/support, nurturing, spirituality, and music-making (leisure).    Goal Area(s) Addresses:  Patient will engage in pro-social way in music group.  Patient will follow directions of drum leader on the first prompt. Patient will demonstrate no behavioral issues during group.  Patient will identify if a reduction in stress level occurs as a result of participation in therapeutic drum circle.     Affect/Mood: Appropriate   Participation Level: Moderate    Clinical Observations/Individualized Feedback: Caitlin Rivera came late to group. Pt was present for less than half of the session.   Plan: Continue to engage patient in RT group sessions 2-3x/week.   Deatrice Factor, LRT, CTRS 06/27/2023 5:29 PM

## 2023-06-27 NOTE — Progress Notes (Signed)
 [facility] MD Progress Note  06/27/2023 9:28 AM Caitlin Rivera  MRN:  [id number]  68 year old female presented voluntarily to the ED for evaluation of depression and suicidal ideation. They are admitted to behavioral health. There is a known history of depression, DM, morbid obesity, hypercholesterolemia, hypertension, COPD, homelessness, and medication noncompliance.  Patient reports intent to overdose on her medications, though indicated that she had been not been compliant with her medications recently for over a week, citing forgetfulness and stating that the medications "don't work." She also reports being homeless for the past 2 years and feeling unsupported and hungry. During the ED and psychiatric consult, patient expressed feelings of hopelessness, helplessness, and frustration, stating I'll just go get run over by a train.  On exam today she continues to endorse feelings of hopelessness, helplessness, anhedonia, decreased energy, and decreased concentration.  She notes feelings of hopelessness and rates depression as 10 out of 10.  She also notes feelings of guilt and worry rated anxiety at 10 out of 10 as well.   Subjective:  Chart reviewed, patient seen during rounds.    6/17:Patient is found walking around unit utilizing her walker. She is alert and oriented. Notes her back pain is elevated today, discuss need to follow up with pain management. Affect is bright. They deny SI/HI/AVH At present.  They are attending group sessions and interacting appropriate with peers.  They deny adverse effects of medications.  They report depression is 4 out of 10 and anxiety is 6 out of 10.  They report that anxiety is specifically associated with discharge planning as they were not approved for the [facility].  They are linear logical and future oriented.  They deny adverse effects of medication.  They are pleasant and cooperative.  No overt psychosis or mania.  Continue to have intermittent voices where they  hear their mothers voice at night, note it is not distressing, and has been present hx regardless of medication.   6/16 patient seen today for reevaluation.  They are found in the group room interactive with their peers and staff appropriately.  Affect is bright.  They are pleasant and cooperative.  They deny SI, HI, and AVH.  They note they most recently had hallucinations yesterday notes they are nonstressful that she is talking to relatives generally happens at nighttime.  She reports depression is 5 out of 10 and anxiety is 6 out of 10.  Demonstrates good insight into the need for outpatient follow-up and medication compliance.  They are linear logical and future oriented.  Deny adverse effects of medications.  They appear calm and relaxed.  No overt psychosis or mania is present.  Homelessness continues to be the main barrier to care however they have an interview with the [employer].  Patient is medication compliant and did not require psychiatric PRNs overnight.  6/15:Patient seen today for psychiatric evaluation.  Upon approach she reports her depression worsening today related to the being Father's Day and her father and brother are passed.  She denies suicidal homicidal ideations reporting she feels safe.  In assessing symptoms of psychosis patient states yeah I am hearing voices I hear my mom and talking to me.  Patient relays this hallucinations have been consistent on and off throughout this day.  She reports depression is 8 of 10 with constant anxiety verbalizing plan to attend group and they with others.  Patient is appearing calm with relaxed affect, she is not appearing psychotic or manic. Homelessness remains barrier  to safe discharge, social work involved in planning. Patient is medication compliant.   06/24/23: Patient seen today for follow-up psychiatric relation.  Upon approach she is noted to be in bed upon approach she states her night okay she continues to report  significant depression 6/10 and constant anxiety.  She denies thoughts to harm herself or anyone else however shares I'm just tired of living. She denies AVH paranoia and delusions. Thoughts are negativistic In reviewing medications she denies side effects.  She continues to request pain medication which review advised from attending to consult her pain doctor after discharge.  Per nursing staff no behavioral problems she is taking her medications with no reported side effects. Homelessness remains barrier to safe discharge, social work involved in planning. Patient is medication compliant.   Sleep: poor  Appetite:  good  Past Psychiatric History: see h&P Family History:  Family History  Problem Relation Age of Onset   Cancer Mother    Social History:  Social History   Substance and Sexual Activity  Alcohol Use No     Social History   Substance and Sexual Activity  Drug Use Yes   Types: Crack cocaine   Comment: crack-last smoked 2 wks ago-07-24-19, states she uses once a month    Social History   Socioeconomic History   Marital status: Widowed    Spouse name: Not on file   Number of children: Not on file   Years of education: Not on file   Highest education level: Not on file  Occupational History   Not on file  Tobacco Use   Smoking status: Every Day    Current packs/day: 0.50    Average packs/day: 0.5 packs/day for 35.5 years (17.7 ttl pk-yrs)    Types: Cigarettes    Start date: 45   Smokeless tobacco: Never  Substance and Sexual Activity   Alcohol use: No   Drug use: Yes    Types: Crack cocaine    Comment: crack-last smoked 2 wks ago-07-24-19, states she uses once a month   Sexual activity: Not on file  Other Topics Concern   Not on file  Social History Narrative   Not on file   Social Drivers of Health   Financial Resource Strain: Not on file  Food Insecurity: Patient Declined (06/17/2023)   Hunger Vital Sign    Worried About Running Out of Food in  the Last Year: Patient declined    Ran Out of Food in the Last Year: Patient declined  Transportation Needs: Unmet Transportation Needs (06/17/2023)   PRAPARE - Administrator, Civil Service (Medical): Yes    Lack of Transportation (Non-Medical): Yes  Physical Activity: Not on file  Stress: Not on file  Social Connections: Moderately Isolated (06/17/2023)   Social Connection and Isolation Panel    Frequency of Communication with Friends and Family: Twice a week    Frequency of Social Gatherings with Friends and Family: Twice a week    Attends Religious Services: 1 to 4 times per year    Active Member of [employer] or Organizations: No    Attends Banker Meetings: Never    Marital Status: Widowed   Past Medical History:  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   COPD (chronic obstructive pulmonary disease) (HCC)    smoker   Diabetes mellitus without complication (HCC)    Fibroids    Hypertension  Personal history of radiation therapy    Smoker     Past Surgical History:  Procedure Laterality Date   BREAST BIOPSY Bilateral 06/03/2019   BREAST BIOPSY Left 06/14/2019   BREAST EXCISIONAL BIOPSY Right 07/2019   BREAST LUMPECTOMY Left 07/2019   BREAST LUMPECTOMY WITH RADIOACTIVE SEED LOCALIZATION Bilateral 07/31/2019   Procedure: BILATERAL BREAST LUMPECTOMY WITH RADIOACTIVE SEED LOCALIZATION;  Surgeon: Caralyn Chandler, MD;  Location: [facility];  Service: General;  Laterality: Bilateral;   TUBAL LIGATION     TUBAL LIGATION      Current Medications: Current Facility-Administered Medications  Medication Dose Route Frequency Provider Last Rate Last Admin   acetaminophen  (TYLENOL ) tablet 650 mg  650 mg Oral Q6H PRN Bobbitt, Shalon E, NP   650 mg at 06/26/23 1532   albuterol  (VENTOLIN  HFA) 108 (90 Base) MCG/ACT inhaler 1-2 puff  1-2 puff Inhalation Q6H PRN Bobbitt, Shalon E, NP   2 puff at 06/27/23 0852    alum & mag hydroxide-simeth (MAALOX/MYLANTA) 200-200-20 MG/5ML suspension 30 mL  30 mL Oral Q4H PRN Bobbitt, Shalon E, NP       amLODipine  (NORVASC ) tablet 5 mg  5 mg Oral Daily Bobbitt, Shalon E, NP   5 mg at 06/27/23 4401   ARIPiprazole  (ABILIFY ) tablet 2 mg  2 mg Oral Daily Dessire Grimes E, PA-C   2 mg at 06/27/23 0272   haloperidol  (HALDOL ) tablet 5 mg  5 mg Oral TID PRN Bobbitt, Shalon E, NP       And   diphenhydrAMINE  (BENADRYL ) capsule 50 mg  50 mg Oral TID PRN Bobbitt, Shalon E, NP       haloperidol  lactate (HALDOL ) injection 5 mg  5 mg Intramuscular TID PRN Bobbitt, Shalon E, NP       And   diphenhydrAMINE  (BENADRYL ) injection 50 mg  50 mg Intramuscular TID PRN Bobbitt, Shalon E, NP       And   LORazepam  (ATIVAN ) injection 2 mg  2 mg Intramuscular TID PRN Bobbitt, Shalon E, NP       DULoxetine  (CYMBALTA ) DR capsule 40 mg  40 mg Oral BID Jadapalle, Sree, MD   40 mg at 06/27/23 0850   fluticasone  furoate-vilanterol (BREO ELLIPTA ) 200-25 MCG/ACT 1 puff  1 puff Inhalation Daily Bobbitt, Shalon E, NP   1 puff at 06/25/23 0851   gabapentin  (NEURONTIN ) capsule 600 mg  600 mg Oral BID Bobbitt, Shalon E, NP   600 mg at 06/27/23 0849   magnesium  hydroxide (MILK OF MAGNESIA) suspension 30 mL  30 mL Oral Daily PRN Bobbitt, Shalon E, NP       metFORMIN  (GLUCOPHAGE ) tablet 500 mg  500 mg Oral Q breakfast Bobbitt, Shalon E, NP   500 mg at 06/27/23 0850   prazosin  (MINIPRESS ) capsule 1 mg  1 mg Oral QHS Bobbitt, Shalon E, NP   1 mg at 06/26/23 2135   rosuvastatin  (CRESTOR ) tablet 20 mg  20 mg Oral Daily Bobbitt, Shalon E, NP   20 mg at 06/27/23 0850   tiZANidine  (ZANAFLEX ) tablet 4 mg  4 mg Oral Q8H PRN Bobbitt, Shalon E, NP   4 mg at 06/27/23 0847   traZODone  (DESYREL ) tablet 100 mg  100 mg Oral QHS Jadapalle, Sree, MD   100 mg at 06/26/23 2134   traZODone  (DESYREL ) tablet 50 mg  50 mg Oral QHS PRN Bobbitt, Shalon E, NP   50 mg at 06/23/23 2130    Lab Results:  No results found for  this or  any previous visit (from the past 48 hours).   Blood Alcohol level:  Lab Results  Component Value Date   [facility] <15 06/15/2023   ETH <10 02/10/2023    Metabolic Disorder Labs: Lab Results  Component Value Date   HGBA1C 6.6 (H) 02/03/2023   MPG 142.72 02/03/2023   No results found for: PROLACTIN Lab Results  Component Value Date   CHOL 151 06/21/2023   TRIG 126 06/21/2023   HDL 45 06/21/2023   CHOLHDL 3.4 06/21/2023   VLDL 25 06/21/2023   LDLCALC 81 06/21/2023    Psychiatric Specialty Exam:  Presentation  General Appearance:  Casual  Eye Contact: Fair  Speech: Clear and Coherent  Speech Volume: Normal    Mood and Affect  Mood: Euthymic  Affect: Congruent   Thought Process  Thought Processes: Coherent; Linear; Goal Directed  Descriptions of Associations:Intact  Orientation:Full (Time, Place and Person)  Thought Content:reality based  Hallucinations:chronic auditory hallucinations, not command type   Ideas of Reference:None  Suicidal Thoughts:passive SI   Homicidal Thoughts:denies    Sensorium  Memory: Immediate Fair  Judgment: Fair  Insight: Fair   Art therapist  Concentration: Fair  Attention Span: Fair  Recall: Good  Fund of Knowledge: Good  Language: Good   Psychomotor Activity  Psychomotor Activity: slowed   Musculoskeletal: Strength & Muscle Tone: within normal limits Gait & Station: unsteady Assets  Assets: Manufacturing systems engineer; Desire for Improvement    Physical Exam: Physical Exam Vitals and nursing note reviewed.  Constitutional:      Appearance: She is obese.  HENT:     Head: Atraumatic.   Eyes:     Extraocular Movements: Extraocular movements intact.   Pulmonary:     Effort: Pulmonary effort is normal.   Neurological:     Mental Status: She is alert and oriented to person, place, and time.    Review of Systems  Psychiatric/Behavioral:  Positive for depression. Negative  for substance abuse and suicidal ideas. The patient is nervous/anxious. The patient does not have insomnia.    Blood pressure (!) 106/54, pulse 78, temperature 98 F (36.7 C), temperature source Oral, resp. rate 19, height 5' 6 (1.676 m), weight 107.5 kg, SpO2 98%. Body mass index is 38.25 kg/m.  Diagnosis: Principal Problem:   MDD (major depressive disorder), recurrent episode, severe (HCC) Active Problems:   Cocaine abuse, episodic (HCC)   PLAN: Safety and Monitoring:  -- Voluntary admission to inpatient psychiatric unit for safety, stabilization and treatment  -- Daily contact with patient to assess and evaluate symptoms and progress in treatment  -- Patient's case to be discussed in multi-disciplinary team meeting  -- Observation Level : q15 minute checks  -- Vital signs:  q12 hours  -- Precautions: suicide, elopement, and assault -- Encouraged patient to participate in unit milieu and in scheduled group therapies  2. Psychiatric Diagnoses and Treatment:                           MDD:             Continue Cymbalta  40 mg twice daily  Abilify  2 mg daily  Trazodone  100 mg daily for insomnia             PTSD rule out             Prazosin  1 mg nightly             Anxiety/insomnia  Increase gabapentin  700 mg twice daily  Add lidocaine  patches for shoulder and back pain -- The risks/benefits/side-effects/alternatives to this medication were discussed in detail with the patient and time was given for questions. The patient consents to medication trial.                -- Metabolic profile and EKG monitoring obtained while on an atypical antipsychotic (BMI: Lipid Panel: HbgA1c: QTc:)              -- Encouraged patient to participate in unit milieu and in scheduled group therapies        3. Medical Issues Being Addressed:   Home meds restarted. amlodipine  5 mg daily, Breo Ellipta  substituted for Symbicort , metformin  500 mg daily, rosuvastatin  20 mg daily.  Consult to diabetes coordinator is placed.   4. Discharge Planning:   -- discharge planning is complex, we continue to seek appropriate disposition  -- Social work and case management to assist with discharge planning and identification of hospital follow-up needs prior to discharge  -- Estimated LOS: 3-4 days  Fay Hoop, PA-C 06/27/2023, 9:28 AM

## 2023-06-27 NOTE — Progress Notes (Signed)
 Pt calm and pleasant during assessment.Pt denies SI/HI/AVH, endorses anxiety and depression. Pt observed by this Clinical research associate interacting appropriately with staff and peers on the unit. Pt compliant with medication administration per MD orders. Pt given education, support, and encouragement to be active in her treatment plan. Pt being monitored Q 15 minutes for safety per unit protocol, remains safe on the unit.

## 2023-06-27 NOTE — Plan of Care (Signed)
  Problem: Education: Goal: Emotional status will improve Outcome: Progressing Goal: Mental status will improve Outcome: Progressing   Problem: Coping: Goal: Ability to verbalize frustrations and anger appropriately will improve Outcome: Progressing   Problem: Health Behavior/Discharge Planning: Goal: Compliance with treatment plan for underlying cause of condition will improve Outcome: Progressing   Problem: Physical Regulation: Goal: Ability to maintain clinical measurements within normal limits will improve Outcome: Progressing   Problem: Safety: Goal: Periods of time without injury will increase Outcome: Progressing

## 2023-06-27 NOTE — Progress Notes (Signed)
   06/26/23 2000  Psych Admission Type (Psych Patients Only)  Admission Status Voluntary  Psychosocial Assessment  Patient Complaints Other (Comment)  Eye Contact Fair  Facial Expression Animated  Affect Appropriate to circumstance  Speech Logical/coherent  Interaction Assertive  Motor Activity Slow  Appearance/Hygiene Unremarkable  Behavior Characteristics Cooperative;Appropriate to situation  Mood Pleasant  Aggressive Behavior  Targets Self  Effect No apparent injury  Thought Process  Coherency WDL  Content WDL  Delusions None reported or observed  Perception WDL  Hallucination None reported or observed  Judgment Limited  Confusion None  Danger to Self  Current suicidal ideation? Denies  Self-Injurious Behavior No self-injurious ideation or behavior indicators observed or expressed   Agreement Not to Harm Self Yes  Description of Agreement verbal  Danger to Others  Danger to Others None reported or observed   Patient had phone interview with [facility]. Upset states they denied her a bed.

## 2023-06-28 DIAGNOSIS — F333 Major depressive disorder, recurrent, severe with psychotic symptoms: Secondary | ICD-10-CM | POA: Diagnosis not present

## 2023-06-28 NOTE — Group Note (Signed)
 Date:  06/28/2023 Time:  9:30 PM  Group Topic/Focus:  Wellness Toolbox:   The focus of this group is to discuss various aspects of wellness, balancing those aspects and exploring ways to increase the ability to experience wellness.  Patients will create a wellness toolbox for use upon discharge.    Participation Level:  Minimal  Participation Quality:  Resistant  Affect:  Flat  Cognitive:  Lacking  Insight: Limited  Engagement in Group:  Poor  Modes of Intervention:  Discussion and Support  Additional Comments:  Patient was feeling down because a lot of the original people that she got to know on the unit has moved on. Also a placement home denied her, but after group we had a conversation and she started feeling a little better.  Caitlin Rivera 06/28/2023, 9:30 PM

## 2023-06-28 NOTE — Progress Notes (Signed)
 Pt calm and pleasant during assessment.Pt denies SI/HI/AVH, endorses anxiety and depression. Pt observed by this Clinical research associate interacting appropriately with staff and peers on the unit. Pt compliant with medication administration per MD orders. Pt given education, support, and encouragement to be active in her treatment plan. Pt being monitored Q 15 minutes for safety per unit protocol, remains safe on the unit.

## 2023-06-28 NOTE — Plan of Care (Signed)
  Problem: Education: Goal: Knowledge of [facility] Education information/materials will improve Outcome: Progressing Goal: Mental status will improve Outcome: Progressing Goal: Verbalization of understanding the information provided will improve Outcome: Progressing   Problem: Activity: Goal: Interest or engagement in activities will improve Outcome: Progressing   Problem: Coping: Goal: Ability to verbalize frustrations and anger appropriately will improve Outcome: Progressing   Problem: Safety: Goal: Periods of time without injury will increase Outcome: Progressing

## 2023-06-28 NOTE — Group Note (Signed)
 Date:  06/28/2023 Time:  2:41 PM  Group Topic/Focus:  Goals Group:   The focus of this group is to help patients establish daily goals to achieve during treatment and discuss how the patient can incorporate goal setting into their daily lives to aide in recovery.    Participation Level:  Active  Participation Quality:  Appropriate  Affect:  Appropriate  Cognitive:  Appropriate  Insight: Appropriate  Engagement in Group:  Engaged  Modes of Intervention:  Discussion, Education, and Support  Additional Comments:    Laverne Potter 06/28/2023, 2:41 PM

## 2023-06-28 NOTE — Group Note (Signed)
 [facility] LCSW Group Therapy Note   Group Date: 06/28/2023 Start Time: 1300 End Time: 1400   Type of Therapy/Topic:  Group Therapy:  Emotion Regulation  Participation Level:  Minimal    Description of Group:    The purpose of this group is to assist patients in learning to regulate negative emotions and experience positive emotions. Patients will be guided to discuss ways in which they have been vulnerable to their negative emotions. These vulnerabilities will be juxtaposed with experiences of positive emotions or situations, and patients challenged to use positive emotions to combat negative ones. Special emphasis will be placed on coping with negative emotions in conflict situations, and patients will process healthy conflict resolution skills.  Therapeutic Goals: Patient will identify two positive emotions or experiences to reflect on in order to balance out negative emotions:  Patient will label two or more emotions that they find the most difficult to experience:  Patient will be able to demonstrate positive conflict resolution skills through discussion or role plays:   Summary of Patient Progress: Patient was present for the entirety of the group process. She participated during the icebreaker but not further in the larger group discussion. Pt spent the majority of her time in group with her eyes closed.     Therapeutic Modalities:   Cognitive Behavioral Therapy Feelings Identification Dialectical Behavioral Therapy   Randolm Butte, LCSW

## 2023-06-28 NOTE — Progress Notes (Signed)
 [facility] MD Progress Note  06/28/2023 3:39 PM Caitlin Rivera  MRN:  [id number]  68 year old female presented voluntarily to the ED for evaluation of depression and suicidal ideation. They are admitted to behavioral health. There is a known history of depression, DM, morbid obesity, hypercholesterolemia, hypertension, COPD, homelessness, and medication noncompliance.  Patient reports intent to overdose on her medications, though indicated that she had been not been compliant with her medications recently for over a week, citing forgetfulness and stating that the medications "don't work." She also reports being homeless for the past 2 years and feeling unsupported and hungry. During the ED and psychiatric consult, patient expressed feelings of hopelessness, helplessness, and frustration, stating I'll just go get run over by a train.  On exam today she continues to endorse feelings of hopelessness, helplessness, anhedonia, decreased energy, and decreased concentration.  She notes feelings of hopelessness and rates depression as 10 out of 10.  She also notes feelings of guilt and worry rated anxiety at 10 out of 10 as well.   Subjective:  Chart reviewed, patient seen during rounds.     6/18: Patient seen for follow-up they are found ambulating around the unit using their walker.  They are alert and oriented.  They are pleasant and cooperative with exam.  They are linear logical and future oriented they deny SI, HI, and AVH.  Discussed that they are unwilling to go outside about 1 ounce [place] for treatment.  Discussed that we have exhausted placement options and the patient will likely need to go to the shelter as they have also declined hotel placement.  Discussed considering other treatment options they are resistant.  They voiced no concerns or complaints at this time. 6/17:Patient is found walking around unit utilizing her walker. She is alert and oriented. Notes her back pain is elevated today, discuss  need to follow up with pain management. Affect is bright. They deny SI/HI/AVH At present.  They are attending group sessions and interacting appropriate with peers.  They deny adverse effects of medications.  They report depression is 4 out of 10 and anxiety is 6 out of 10.  They report that anxiety is specifically associated with discharge planning as they were not approved for the [place] [facility].  They are linear logical and future oriented.  They deny adverse effects of medication.  They are pleasant and cooperative.  No overt psychosis or mania.  Continue to have intermittent voices where they hear their mothers voice at night, note it is not distressing, and has been present hx regardless of medication.   6/16 patient seen today for reevaluation.  They are found in the group room interactive with their peers and staff appropriately.  Affect is bright.  They are pleasant and cooperative.  They deny SI, HI, and AVH.  They note they most recently had hallucinations yesterday notes they are nonstressful that she is talking to relatives generally happens at nighttime.  She reports depression is 5 out of 10 and anxiety is 6 out of 10.  Demonstrates good insight into the need for outpatient follow-up and medication compliance.  They are linear logical and future oriented.  Deny adverse effects of medications.  They appear calm and relaxed.  No overt psychosis or mania is present.  Homelessness continues to be the main barrier to care however they have an interview with the [employer].  Patient is medication compliant and did not require psychiatric PRNs overnight.  6/15:Patient seen today for psychiatric evaluation.  Upon approach she reports her depression worsening today related to the being Father's Day and her father and brother are passed.  She denies suicidal homicidal ideations reporting she feels safe.  In assessing symptoms of psychosis patient states yeah I am hearing voices I hear my mom and  talking to me.  Patient relays this hallucinations have been consistent on and off throughout this day.  She reports depression is 8 of 10 with constant anxiety verbalizing plan to attend group and they with others.  Patient is appearing calm with relaxed affect, she is not appearing psychotic or manic. Homelessness remains barrier to safe discharge, social work involved in planning. Patient is medication compliant.   06/24/23: Patient seen today for follow-up psychiatric relation.  Upon approach she is noted to be in bed upon approach she states her night okay she continues to report significant depression 6/10 and constant anxiety.  She denies thoughts to harm herself or anyone else however shares I'm just tired of living. She denies AVH paranoia and delusions. Thoughts are negativistic In reviewing medications she denies side effects.  She continues to request pain medication which review advised from attending to consult her pain doctor after discharge.  Per nursing staff no behavioral problems she is taking her medications with no reported side effects. Homelessness remains barrier to safe discharge, social work involved in planning. Patient is medication compliant.   Sleep: poor  Appetite:  good  Past Psychiatric History: see h&P Family History:  Family History  Problem Relation Age of Onset   Cancer Mother    Social History:  Social History   Substance and Sexual Activity  Alcohol Use No     Social History   Substance and Sexual Activity  Drug Use Yes   Types: Crack cocaine   Comment: crack-last smoked 2 wks ago-07-24-19, states she uses once a month    Social History   Socioeconomic History   Marital status: Widowed    Spouse name: Not on file   Number of children: Not on file   Years of education: Not on file   Highest education level: Not on file  Occupational History   Not on file  Tobacco Use   Smoking status: Every Day    Current packs/day: 0.50    Average  packs/day: 0.5 packs/day for 35.5 years (17.7 ttl pk-yrs)    Types: Cigarettes    Start date: 41   Smokeless tobacco: Never  Substance and Sexual Activity   Alcohol use: No   Drug use: Yes    Types: Crack cocaine    Comment: crack-last smoked 2 wks ago-07-24-19, states she uses once a month   Sexual activity: Not on file  Other Topics Concern   Not on file  Social History Narrative   Not on file   Social Drivers of Health   Financial Resource Strain: Not on file  Food Insecurity: Patient Declined (06/17/2023)   Hunger Vital Sign    Worried About Running Out of Food in the Last Year: Patient declined    Ran Out of Food in the Last Year: Patient declined  Transportation Needs: Unmet Transportation Needs (06/17/2023)   PRAPARE - Administrator, Civil Service (Medical): Yes    Lack of Transportation (Non-Medical): Yes  Physical Activity: Not on file  Stress: Not on file  Social Connections: Moderately Isolated (06/17/2023)   Social Connection and Isolation Panel    Frequency of Communication with Friends and Family: Twice a week  Frequency of Social Gatherings with Friends and Family: Twice a week    Attends Religious Services: 1 to 4 times per year    Active Member of [employer] or Organizations: No    Attends Banker Meetings: Never    Marital Status: Widowed   Past Medical History:  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   COPD (chronic obstructive pulmonary disease) (HCC)    smoker   Diabetes mellitus without complication (HCC)    Fibroids    Hypertension    Personal history of radiation therapy    Smoker     Past Surgical History:  Procedure Laterality Date   BREAST BIOPSY Bilateral 06/03/2019   BREAST BIOPSY Left 06/14/2019   BREAST EXCISIONAL BIOPSY Right 07/2019   BREAST LUMPECTOMY Left 07/2019   BREAST LUMPECTOMY WITH RADIOACTIVE SEED LOCALIZATION Bilateral 07/31/2019   Procedure:  BILATERAL BREAST LUMPECTOMY WITH RADIOACTIVE SEED LOCALIZATION;  Surgeon: Caralyn Chandler, MD;  Location: [facility];  Service: General;  Laterality: Bilateral;   TUBAL LIGATION     TUBAL LIGATION      Current Medications: Current Facility-Administered Medications  Medication Dose Route Frequency Provider Last Rate Last Admin   acetaminophen  (TYLENOL ) tablet 650 mg  650 mg Oral Q6H PRN Bobbitt, Shalon E, NP   650 mg at 06/26/23 1532   albuterol  (VENTOLIN  HFA) 108 (90 Base) MCG/ACT inhaler 1-2 puff  1-2 puff Inhalation Q6H PRN Bobbitt, Shalon E, NP   2 puff at 06/28/23 0817   alum & mag hydroxide-simeth (MAALOX/MYLANTA) 200-200-20 MG/5ML suspension 30 mL  30 mL Oral Q4H PRN Bobbitt, Shalon E, NP       amLODipine  (NORVASC ) tablet 5 mg  5 mg Oral Daily Bobbitt, Shalon E, NP   5 mg at 06/28/23 0815   ARIPiprazole  (ABILIFY ) tablet 2 mg  2 mg Oral Daily Ahmari Duerson E, PA-C   2 mg at 06/28/23 1610   haloperidol  (HALDOL ) tablet 5 mg  5 mg Oral TID PRN Bobbitt, Shalon E, NP       And   diphenhydrAMINE  (BENADRYL ) capsule 50 mg  50 mg Oral TID PRN Bobbitt, Shalon E, NP       haloperidol  lactate (HALDOL ) injection 5 mg  5 mg Intramuscular TID PRN Bobbitt, Shalon E, NP       And   diphenhydrAMINE  (BENADRYL ) injection 50 mg  50 mg Intramuscular TID PRN Bobbitt, Shalon E, NP       And   LORazepam  (ATIVAN ) injection 2 mg  2 mg Intramuscular TID PRN Bobbitt, Shalon E, NP       DULoxetine  (CYMBALTA ) DR capsule 40 mg  40 mg Oral BID Jadapalle, Sree, MD   40 mg at 06/28/23 0816   fluticasone  furoate-vilanterol (BREO ELLIPTA ) 200-25 MCG/ACT 1 puff  1 puff Inhalation Daily Bobbitt, Shalon E, NP   1 puff at 06/25/23 0851   gabapentin  (NEURONTIN ) capsule 700 mg  700 mg Oral BID Azarria Balint E, PA-C   700 mg at 06/28/23 0815   lidocaine  (LIDODERM ) 5 % 2 patch  2 patch Transdermal Q24H Shamel Germond E, PA-C   2 patch at 06/28/23 9604   magnesium  hydroxide (MILK OF MAGNESIA)  suspension 30 mL  30 mL Oral Daily PRN Bobbitt, Shalon E, NP       metFORMIN  (GLUCOPHAGE ) tablet 500 mg  500 mg Oral Q breakfast Bobbitt, Shalon E, NP   500 mg  at 06/28/23 0815   prazosin  (MINIPRESS ) capsule 1 mg  1 mg Oral QHS Bobbitt, Shalon E, NP   1 mg at 06/27/23 2101   rosuvastatin  (CRESTOR ) tablet 20 mg  20 mg Oral Daily Bobbitt, Shalon E, NP   20 mg at 06/28/23 0816   tiZANidine  (ZANAFLEX ) tablet 4 mg  4 mg Oral Q8H PRN Bobbitt, Shalon E, NP   4 mg at 06/28/23 0816   traZODone  (DESYREL ) tablet 100 mg  100 mg Oral QHS Jadapalle, Sree, MD   100 mg at 06/27/23 2101   traZODone  (DESYREL ) tablet 50 mg  50 mg Oral QHS PRN Bobbitt, Shalon E, NP   50 mg at 06/27/23 2102    Lab Results:  No results found for this or any previous visit (from the past 48 hours).   Blood Alcohol level:  Lab Results  Component Value Date   [facility] <15 06/15/2023   ETH <10 02/10/2023    Metabolic Disorder Labs: Lab Results  Component Value Date   HGBA1C 6.6 (H) 02/03/2023   MPG 142.72 02/03/2023   No results found for: PROLACTIN Lab Results  Component Value Date   CHOL 151 06/21/2023   TRIG 126 06/21/2023   HDL 45 06/21/2023   CHOLHDL 3.4 06/21/2023   VLDL 25 06/21/2023   LDLCALC 81 06/21/2023    Psychiatric Specialty Exam:  Presentation  General Appearance:  Casual  Eye Contact: Fair  Speech: Clear and Coherent  Speech Volume: Normal    Mood and Affect  Mood: Euthymic  Affect: Congruent   Thought Process  Thought Processes: Coherent  Descriptions of Associations:Intact  Orientation:Full (Time, Place and Person)  Thought Content:reality based  Hallucinations:chronic auditory hallucinations, not command type   Ideas of Reference:None  Suicidal Thoughts:passive SI   Homicidal Thoughts:denies    Sensorium  Memory: Immediate Fair; Recent Fair  Judgment: Fair  Insight: Fair   Art therapist  Concentration: Fair  Attention  Span: Fair  Recall: Good  Fund of Knowledge: Good  Language: Good   Psychomotor Activity  Psychomotor Activity: slowed   Musculoskeletal: Strength & Muscle Tone: within normal limits Gait & Station: unsteady Assets  Assets: Manufacturing systems engineer; Desire for Improvement    Physical Exam: Physical Exam Vitals and nursing note reviewed.  HENT:     Head: Atraumatic.   Eyes:     Extraocular Movements: Extraocular movements intact.   Pulmonary:     Effort: Pulmonary effort is normal.   Neurological:     Mental Status: She is alert and oriented to person, place, and time.    Review of Systems  Psychiatric/Behavioral:  Negative for depression, substance abuse and suicidal ideas. The patient is nervous/anxious. The patient does not have insomnia.    Blood pressure 139/75, pulse 91, temperature 98.3 F (36.8 C), resp. rate (!) 21, height 5' 6 (1.676 m), weight 107.5 kg, SpO2 93%. Body mass index is 38.25 kg/m.  Diagnosis: Principal Problem:   MDD (major depressive disorder), recurrent episode, severe (HCC) Active Problems:   Cocaine abuse, episodic (HCC)   PLAN: Safety and Monitoring:  -- Voluntary admission to inpatient psychiatric unit for safety, stabilization and treatment  -- Daily contact with patient to assess and evaluate symptoms and progress in treatment  -- Patient's case to be discussed in multi-disciplinary team meeting  -- Observation Level : q15 minute checks  -- Vital signs:  q12 hours  -- Precautions: suicide, elopement, and assault -- Encouraged patient to participate in unit milieu and in scheduled group  therapies  2. Psychiatric Diagnoses and Treatment:                           MDD:             Continue Cymbalta  40 mg twice daily  Abilify  2 mg daily  Trazodone  100 mg daily for insomnia             PTSD rule out             Prazosin  1 mg nightly             Anxiety/insomnia                       Increase gabapentin  700 mg twice  daily  Add lidocaine  patches for shoulder and back pain -- The risks/benefits/side-effects/alternatives to this medication were discussed in detail with the patient and time was given for questions. The patient consents to medication trial.                -- Metabolic profile and EKG monitoring obtained while on an atypical antipsychotic (BMI: Lipid Panel: HbgA1c: QTc:)              -- Encouraged patient to participate in unit milieu and in scheduled group therapies        3. Medical Issues Being Addressed:   Home meds restarted. amlodipine  5 mg daily, Breo Ellipta  substituted for Symbicort , metformin  500 mg daily, rosuvastatin  20 mg daily. Consult to diabetes coordinator is placed.   4. Discharge Planning:   -- discharge planning is complex, we continue to seek appropriate disposition likely thurs/friday  -- Social work and case management to assist with discharge planning and identification of hospital follow-up needs prior to discharge  -- Estimated LOS: 3-4 days  Fay Hoop, PA-C 06/28/2023, 3:39 PM

## 2023-06-28 NOTE — Plan of Care (Signed)
   Problem: Education: Goal: Emotional status will improve Outcome: Progressing Goal: Mental status will improve Outcome: Progressing

## 2023-06-28 NOTE — Progress Notes (Signed)
   06/28/23 0900  Psych Admission Type (Psych Patients Only)  Admission Status Voluntary  Psychosocial Assessment  Patient Complaints None  Eye Contact Fair  Facial Expression Animated  Affect Appropriate to circumstance  Speech Logical/coherent  Interaction Assertive  Motor Activity Slow  Appearance/Hygiene Unremarkable  Behavior Characteristics Cooperative;Appropriate to situation  Mood Pleasant  Thought Process  Coherency WDL  Content WDL  Delusions None reported or observed  Perception WDL  Hallucination None reported or observed  Judgment Impaired  Confusion None  Danger to Self  Current suicidal ideation? Denies  Danger to Others  Danger to Others None reported or observed

## 2023-06-28 NOTE — Group Note (Signed)
 Date:  06/28/2023 Time:  5:34 PM  Group Topic/Focus:  Wellness Toolbox:   The focus of this group is to discuss various aspects of wellness, balancing those aspects and exploring ways to increase the ability to experience wellness.  Patients will create a wellness toolbox for use upon discharge.    Participation Level:  Active  Participation Quality:  Appropriate  Affect:  Appropriate  Cognitive:  Appropriate  Insight: Appropriate  Engagement in Group:  Engaged  Modes of Intervention:  Activity and Socialization  Additional Comments:    Laverne Potter 06/28/2023, 5:34 PM

## 2023-06-28 NOTE — BHH Counselor (Signed)
 CSW spoke with the patient about discharge.    CSW reviewed with the patient referral for substance abuse treatment.  Patient declined, stating that she did not wish to leave [facility].   Pt declined to discuss [employer] outside of [place].   Pt stated that she does not have family or friends to stay with at discharge.    CSW discussed with the patient hotel options. Patient reports that she does not have money until the 3rd of the month.  Pt reports that she will need to follow up with someone with a call and then follow up with this CSW.   Shasta Deist, MSW, LCSW 06/28/2023 11:07 AM

## 2023-06-29 DIAGNOSIS — F333 Major depressive disorder, recurrent, severe with psychotic symptoms: Secondary | ICD-10-CM | POA: Diagnosis not present

## 2023-06-29 NOTE — Group Note (Signed)
 Date:  06/29/2023 Time:  10:54 PM  Group Topic/Focus:  Personal Choices and Values:   The focus of this group is to help patients assess and explore the importance of values in their lives, how their values affect their decisions, how they express their values and what opposes their expression.    Participation Level:  Active  Participation Quality:  Appropriate, Attentive, Sharing, and Supportive  Affect:  Appropriate  Cognitive:  Alert and Appropriate  Insight: Appropriate, Good, and Improving  Engagement in Group:  Developing/Improving, Engaged, and Supportive  Modes of Intervention:  Activity, Clarification, Discussion, Education, Exploration, Problem-solving, Rapport Building, Dance movement psychotherapist, Socialization, and Support  Additional Comments:     [name] 06/29/2023, 10:54 PM

## 2023-06-29 NOTE — Plan of Care (Signed)
   Problem: Education: Goal: Emotional status will improve Outcome: Progressing Goal: Mental status will improve Outcome: Progressing

## 2023-06-29 NOTE — Group Note (Signed)
 Date:  06/29/2023 Time:  6:06 PM  Group Topic/Focus:  Goals Group:   The focus of this group is to help patients establish daily goals to achieve during treatment and discuss how the patient can incorporate goal setting into their daily lives to aide in recovery.  Participation Level:  Active  Participation Quality:  Appropriate  Affect:  Appropriate  Cognitive:  Appropriate  Insight: Appropriate  Engagement in Group:  Engaged  Modes of Intervention:  Activity, Discussion, and Education  Additional Comments:    Kesi Perrow A Amous Crewe 06/29/2023, 6:06 PM

## 2023-06-29 NOTE — Progress Notes (Signed)
   06/29/23 0900  Psych Admission Type (Psych Patients Only)  Admission Status Voluntary  Psychosocial Assessment  Patient Complaints None  Eye Contact Fair  Facial Expression Animated  Affect Appropriate to circumstance  Speech Logical/coherent  Interaction Assertive  Motor Activity Slow  Appearance/Hygiene Unremarkable  Behavior Characteristics Cooperative  Mood Pleasant  Thought Process  Coherency WDL  Content WDL  Delusions None reported or observed  Perception WDL  Hallucination None reported or observed  Judgment Impaired  Confusion None  Danger to Self  Current suicidal ideation? Denies

## 2023-06-29 NOTE — Progress Notes (Signed)
 Pt calm and pleasant during assessment.Pt denies SI/HI/AVH, endorses anxiety and depression. Pt observed by this Clinical research associate interacting appropriately with staff and peers on the unit. Pt compliant with medication administration per MD orders. Pt given education, support, and encouragement to be active in her treatment plan. Pt being monitored Q 15 minutes for safety per unit protocol, remains safe on the unit.

## 2023-06-29 NOTE — Group Note (Signed)
 [facility] LCSW Group Therapy Note   Group Date: 06/29/2023 Start Time: 1310 End Time: 1400   Type of Therapy/Topic:  Group Therapy:  Balance in Life  Participation Level:  None   Description of Group:    This group will address the concept of balance and how it feels and looks when one is unbalanced. Patients will be encouraged to process areas in their lives that are out of balance, and identify reasons for remaining unbalanced. Facilitators will guide patients utilizing problem- solving interventions to address and correct the stressor making their life unbalanced. Understanding and applying boundaries will be explored and addressed for obtaining  and maintaining a balanced life. Patients will be encouraged to explore ways to assertively make their unbalanced needs known to significant others in their lives, using other group members and facilitator for support and feedback.  Therapeutic Goals: Patient will identify two or more emotions or situations they have that consume much of in their lives. Patient will identify signs/triggers that life has become out of balance:  Patient will identify two ways to set boundaries in order to achieve balance in their lives:  Patient will demonstrate ability to communicate their needs through discussion and/or role plays  Summary of Patient Progress: Patient attended group, however slept throughout.    Therapeutic Modalities:   Cognitive Behavioral Therapy Solution-Focused Therapy Assertiveness Training   [name], LCSW

## 2023-06-29 NOTE — BHH Counselor (Signed)
 CSW has provided patient with the contact information for the [facility] in [place]. Pt had lost the paper.  Patient reports a desire to attempt to interview a 2nd time for the home and inform them that she was not being truthful.   Shasta Deist, MSW, LCSW 06/29/2023 3:49 PM

## 2023-06-29 NOTE — Group Note (Signed)
 Recreation Therapy Group Note   Group Topic:Health and Wellness  Group Date: 06/29/2023 Start Time: 1000 End Time: 1100 Facilitators: Deatrice Factor, LRT, CTRS Location: Courtyard  Group Description: [employer]. LRT and patients played games of basketball, drew with chalk, and played corn hole while outside in the courtyard while getting fresh air and sunlight. Music was being played in the background. LRT and peers conversed about different games they have played before, what they do in their free time and anything else that is on their minds. LRT encouraged pts to drink water after being outside, sweating and getting their heart rate up.  Goal Area(s) Addressed: Patient will build on frustration tolerance skills. Patients will partake in a competitive play game with peers. Patients will gain knowledge of new leisure interest/hobby.    Affect/Mood: N/A   Participation Level: Did not attend    Clinical Observations/Individualized Feedback: Patient did not attend group.   Plan: Continue to engage patient in RT group sessions 2-3x/week.   Deatrice Factor, LRT, CTRS 06/29/2023 11:38 AM

## 2023-06-29 NOTE — Plan of Care (Signed)
   Problem: Education: Goal: Knowledge of [place] [place] [facility] Education information/materials will improve Outcome: Progressing Goal: Emotional status will improve Outcome: Progressing

## 2023-06-29 NOTE — Group Note (Signed)
 Recreation Therapy Group Note   Group Topic:Coping Skills  Group Date: 06/29/2023 Start Time: 1530 End Time: 1615 Facilitators: Yvonna Herder, CTRS Location: Craft Room  Group Description: Coping A-Z. LRT and patients engage in a guided discussion on what coping skills are and gave specific examples. LRT passed out a handout labeled Coping A-Z with blank spaces beside each letter. LRT prompted patients to come up with a coping skill for each of the letters. LRT and patients went over the handout and gave ideas for each letter if anyone had any blanks left on their paper. Patients kept this handout with them that listed 26 different coping skills.   Goal Area(s) Addressed: Patients will be able to define "coping skills". Patient will identify new coping skills.  Patient will increase communication.   Affect/Mood: Appropriate   Participation Level: Active and Engaged   Participation Quality: Independent   Behavior: Appropriate, Calm, and Cooperative   Speech/Thought Process: Coherent   Insight: Good   Judgement: Good   Modes of Intervention: Education, Exploration, Worksheet, and Writing   Patient Response to Interventions:  Attentive, Engaged, Interested , and Receptive   Education Outcome:  Acknowledges education   Clinical Observations/Individualized Feedback: Caitlin Rivera was active in their participation of session activities and group discussion. Pt identified babysitting and watching people as coping skills.    Plan: Continue to engage patient in RT group sessions 2-3x/week.   Deatrice Factor, LRT, CTRS 06/29/2023 5:30 PM

## 2023-06-29 NOTE — Progress Notes (Signed)
 [facility] MD Progress Note  06/29/2023 9:39 AM Caitlin Rivera  MRN:  [id number]  68 year old female presented voluntarily to the ED for evaluation of depression and suicidal ideation. They are admitted to behavioral health. There is a known history of depression, DM, morbid obesity, hypercholesterolemia, hypertension, COPD, homelessness, and medication noncompliance.  Patient reports intent to overdose on her medications, though indicated that she had been not been compliant with her medications recently for over a week, citing forgetfulness and stating that the medications "don't work." She also reports being homeless for the past 2 years and feeling unsupported and hungry. During the ED and psychiatric consult, patient expressed feelings of hopelessness, helplessness, and frustration, stating I'll just go get run over by a train.  On exam today she continues to endorse feelings of hopelessness, helplessness, anhedonia, decreased energy, and decreased concentration.  She notes feelings of hopelessness and rates depression as 10 out of 10.  She also notes feelings of guilt and worry rated anxiety at 10 out of 10 as well.   Subjective:  Chart reviewed, patient seen during rounds.    6/19: Patient seen for follow up they are found interacting with staff and peers. They are alert and oriented. They are pleasant and cooperative on exam. They continue to be unwilling to leave [place] discussed that we could get them to the shelter in [place] and they indicate [place] would [place]. They have declined all resources for housing. They do plan to reach back out to [facility] today. They request meds be sent to [facility] pharmacy. They are linear, logical, and future oriented. They deny SI/HI/AVH and contract for safety.   6/18: Patient seen for follow-up they are found ambulating around the unit using their walker.  They are alert and oriented.   They are pleasant and cooperative with exam.  They are linear logical and future oriented they deny SI, HI, and AVH.  Discussed that they are unwilling to go outside about [place] for treatment.  Discussed that we have exhausted placement options and the patient will likely need to go to the shelter as they have also declined hotel placement.  Discussed considering other treatment options they are resistant.  They voiced no concerns or complaints at this time. 6/17:Patient is found walking around unit utilizing her walker. She is alert and oriented. Notes her back pain is elevated today, discuss need to follow up with pain management. Affect is bright. They deny SI/HI/AVH At present.  They are attending group sessions and interacting appropriate with peers.  They deny adverse effects of medications.  They report depression is 4 out of 10 and anxiety is 6 out of 10.  They report that anxiety is specifically associated with discharge planning as they were not approved for the [facility].  They are linear logical and future oriented.  They deny adverse effects of medication.  They are pleasant and cooperative.  No overt psychosis or mania.  Continue to have intermittent voices where they hear their mothers voice at night, note it is not distressing, and has been present hx regardless of medication.   6/16 patient seen today for reevaluation.  They are found in the group room interactive with their peers and staff appropriately.  Affect is bright.  They are pleasant and cooperative.  They deny SI, HI, and AVH.  They note they most recently had hallucinations yesterday notes they are nonstressful that  she is talking to relatives generally happens at nighttime.  She reports depression is 5 out of 10 and anxiety is 6 out of 10.  Demonstrates good insight into the need for outpatient follow-up and medication compliance.  They are linear logical and future oriented.  Deny adverse effects of medications.  They  appear calm and relaxed.  No overt psychosis or mania is present.  Homelessness continues to be the main barrier to care however they have an interview with the [employer].  Patient is medication compliant and did not require psychiatric PRNs overnight.  6/15:Patient seen today for psychiatric evaluation.  Upon approach she reports her depression worsening today related to the being Father's Day and her father and brother are passed.  She denies suicidal homicidal ideations reporting she feels safe.  In assessing symptoms of psychosis patient states yeah I am hearing voices I hear my mom and talking to me.  Patient relays this hallucinations have been consistent on and off throughout this day.  She reports depression is 8 of 10 with constant anxiety verbalizing plan to attend group and they with others.  Patient is appearing calm with relaxed affect, she is not appearing psychotic or manic. Homelessness remains barrier to safe discharge, social work involved in planning. Patient is medication compliant.   06/24/23: Patient seen today for follow-up psychiatric relation.  Upon approach she is noted to be in bed upon approach she states her night okay she continues to report significant depression 6/10 and constant anxiety.  She denies thoughts to harm herself or anyone else however shares I'm just tired of living. She denies AVH paranoia and delusions. Thoughts are negativistic In reviewing medications she denies side effects.  She continues to request pain medication which review advised from attending to consult her pain doctor after discharge.  Per nursing staff no behavioral problems she is taking her medications with no reported side effects. Homelessness remains barrier to safe discharge, social work involved in planning. Patient is medication compliant.   Sleep: poor  Appetite:  good  Past Psychiatric History: see h&P Family History:  Family History  Problem Relation Age of Onset    Cancer Mother    Social History:  Social History   Substance and Sexual Activity  Alcohol Use No     Social History   Substance and Sexual Activity  Drug Use Yes   Types: Crack cocaine   Comment: crack-last smoked 2 wks ago-07-24-19, states she uses once a month    Social History   Socioeconomic History   Marital status: Widowed    Spouse name: Not on file   Number of children: Not on file   Years of education: Not on file   Highest education level: Not on file  Occupational History   Not on file  Tobacco Use   Smoking status: Every Day    Current packs/day: 0.50    Average packs/day: 0.5 packs/day for 35.5 years (17.7 ttl pk-yrs)    Types: Cigarettes    Start date: 10   Smokeless tobacco: Never  Substance and Sexual Activity   Alcohol use: No   Drug use: Yes    Types: Crack cocaine    Comment: crack-last smoked 2 wks ago-07-24-19, states she uses once a month   Sexual activity: Not on file  Other Topics Concern   Not on file  Social History Narrative   Not on file   Social Drivers of Health   Financial Resource Strain: Not on file  Food Insecurity: Patient Declined (06/17/2023)   Hunger Vital Sign    Worried About Running Out of Food in the Last Year: Patient declined    Ran Out of Food in the Last Year: Patient declined  Transportation Needs: Unmet Transportation Needs (06/17/2023)   PRAPARE - Administrator, Civil Service (Medical): Yes    Lack of Transportation (Non-Medical): Yes  Physical Activity: Not on file  Stress: Not on file  Social Connections: Moderately Isolated (06/17/2023)   Social Connection and Isolation Panel    Frequency of Communication with Friends and Family: Twice a week    Frequency of Social Gatherings with Friends and Family: Twice a week    Attends Religious Services: 1 to 4 times per year    Active Member of [employer] or Organizations: No    Attends Banker Meetings: Never    Marital Status: Widowed    Past Medical History:  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   COPD (chronic obstructive pulmonary disease) (HCC)    smoker   Diabetes mellitus without complication (HCC)    Fibroids    Hypertension    Personal history of radiation therapy    Smoker     Past Surgical History:  Procedure Laterality Date   BREAST BIOPSY Bilateral 06/03/2019   BREAST BIOPSY Left 06/14/2019   BREAST EXCISIONAL BIOPSY Right 07/2019   BREAST LUMPECTOMY Left 07/2019   BREAST LUMPECTOMY WITH RADIOACTIVE SEED LOCALIZATION Bilateral 07/31/2019   Procedure: BILATERAL BREAST LUMPECTOMY WITH RADIOACTIVE SEED LOCALIZATION;  Surgeon: Caralyn Chandler, MD;  Location: [facility];  Service: General;  Laterality: Bilateral;   TUBAL LIGATION     TUBAL LIGATION      Current Medications: Current Facility-Administered Medications  Medication Dose Route Frequency Provider Last Rate Last Admin   acetaminophen  (TYLENOL ) tablet 650 mg  650 mg Oral Q6H PRN Bobbitt, Shalon E, NP   650 mg at 06/26/23 1532   albuterol  (VENTOLIN  HFA) 108 (90 Base) MCG/ACT inhaler 1-2 puff  1-2 puff Inhalation Q6H PRN Bobbitt, Shalon E, NP   2 puff at 06/28/23 0817   alum & mag hydroxide-simeth (MAALOX/MYLANTA) 200-200-20 MG/5ML suspension 30 mL  30 mL Oral Q4H PRN Bobbitt, Shalon E, NP       amLODipine  (NORVASC ) tablet 5 mg  5 mg Oral Daily Bobbitt, Shalon E, NP   5 mg at 06/29/23 8295   ARIPiprazole  (ABILIFY ) tablet 2 mg  2 mg Oral Daily Herminia Warren E, PA-C   2 mg at 06/29/23 0825   haloperidol  (HALDOL ) tablet 5 mg  5 mg Oral TID PRN Bobbitt, Shalon E, NP       And   diphenhydrAMINE  (BENADRYL ) capsule 50 mg  50 mg Oral TID PRN Bobbitt, Shalon E, NP       haloperidol  lactate (HALDOL ) injection 5 mg  5 mg Intramuscular TID PRN Bobbitt, Shalon E, NP       And   diphenhydrAMINE  (BENADRYL ) injection 50 mg  50 mg Intramuscular TID PRN Bobbitt, Shalon E, NP       And    LORazepam  (ATIVAN ) injection 2 mg  2 mg Intramuscular TID PRN Bobbitt, Shalon E, NP       DULoxetine  (CYMBALTA ) DR capsule 40 mg  40 mg Oral BID Jadapalle, Sree, MD   40 mg at 06/29/23 0825   fluticasone  furoate-vilanterol (BREO ELLIPTA ) 200-25 MCG/ACT 1 puff  1  puff Inhalation Daily Bobbitt, Shalon E, NP   1 puff at 06/25/23 0851   gabapentin  (NEURONTIN ) capsule 700 mg  700 mg Oral BID Lorik Guo E, PA-C   700 mg at 06/29/23 6578   lidocaine  (LIDODERM ) 5 % 2 patch  2 patch Transdermal Q24H Sparkle Aube E, PA-C   1 patch at 06/29/23 0827   magnesium  hydroxide (MILK OF MAGNESIA) suspension 30 mL  30 mL Oral Daily PRN Bobbitt, Shalon E, NP       metFORMIN  (GLUCOPHAGE ) tablet 500 mg  500 mg Oral Q breakfast Bobbitt, Shalon E, NP   500 mg at 06/29/23 0824   prazosin  (MINIPRESS ) capsule 1 mg  1 mg Oral QHS Bobbitt, Shalon E, NP   1 mg at 06/28/23 2108   rosuvastatin  (CRESTOR ) tablet 20 mg  20 mg Oral Daily Bobbitt, Shalon E, NP   20 mg at 06/29/23 0825   tiZANidine  (ZANAFLEX ) tablet 4 mg  4 mg Oral Q8H PRN Bobbitt, Shalon E, NP   4 mg at 06/29/23 0824   traZODone  (DESYREL ) tablet 100 mg  100 mg Oral QHS Jadapalle, Sree, MD   100 mg at 06/28/23 2109   traZODone  (DESYREL ) tablet 50 mg  50 mg Oral QHS PRN Bobbitt, Shalon E, NP   50 mg at 06/27/23 2102    Lab Results:  No results found for this or any previous visit (from the past 48 hours).   Blood Alcohol level:  Lab Results  Component Value Date   [facility] <15 06/15/2023   ETH <10 02/10/2023    Metabolic Disorder Labs: Lab Results  Component Value Date   HGBA1C 6.6 (H) 02/03/2023   MPG 142.72 02/03/2023   No results found for: PROLACTIN Lab Results  Component Value Date   CHOL 151 06/21/2023   TRIG 126 06/21/2023   HDL 45 06/21/2023   CHOLHDL 3.4 06/21/2023   VLDL 25 06/21/2023   LDLCALC 81 06/21/2023    Psychiatric Specialty Exam:  Presentation  General Appearance:  Casual  Eye Contact: Fair  Speech: Clear  and Coherent  Speech Volume: Normal    Mood and Affect  Mood: Euthymic  Affect: Congruent   Thought Process  Thought Processes: Coherent  Descriptions of Associations:Intact  Orientation:Full (Time, Place and Person)  Thought Content:reality based  Hallucinations:chronic auditory hallucinations, not command type   Ideas of Reference:None  Suicidal Thoughts:passive SI   Homicidal Thoughts:denies    Sensorium  Memory: Immediate Fair; Recent Fair  Judgment: Fair  Insight: Good   Executive Functions  Concentration: Fair  Attention Span: Fair  Recall: Good  Fund of Knowledge: Good  Language: Good   Psychomotor Activity  Psychomotor Activity: slowed   Musculoskeletal: Strength & Muscle Tone: within normal limits Gait & Station: unsteady Assets  Assets: Manufacturing systems engineer; Desire for Improvement    Physical Exam: Physical Exam Vitals and nursing note reviewed.  HENT:     Head: Atraumatic.   Eyes:     Extraocular Movements: Extraocular movements intact.   Pulmonary:     Effort: Pulmonary effort is normal.   Neurological:     Mental Status: She is alert and oriented to person, place, and time.    Review of Systems  Psychiatric/Behavioral:  Negative for depression, hallucinations, substance abuse and suicidal ideas. The patient is nervous/anxious. The patient does not have insomnia.    Blood pressure 125/74, pulse 71, temperature (!) 97.5 F (36.4 C), resp. rate 14, height 5' 6 (1.676 m), weight 107.5 kg, SpO2 93%.  Body mass index is 38.25 kg/m.  Diagnosis: Principal Problem:   MDD (major depressive disorder), recurrent episode, severe (HCC) Active Problems:   Cocaine abuse, episodic (HCC)   PLAN: Safety and Monitoring:  -- Voluntary admission to inpatient psychiatric unit for safety, stabilization and treatment  -- Daily contact with patient to assess and evaluate symptoms and progress in treatment  -- Patient's  case to be discussed in multi-disciplinary team meeting  -- Observation Level : q15 minute checks  -- Vital signs:  q12 hours  -- Precautions: suicide, elopement, and assault -- Encouraged patient to participate in unit milieu and in scheduled group therapies  2. Psychiatric Diagnoses and Treatment:                           MDD:             Continue Cymbalta  40 mg twice daily  Abilify  2 mg daily  Trazodone  100 mg daily for insomnia             PTSD rule out             Prazosin  1 mg nightly             Anxiety/insomnia                       Increase gabapentin  700 mg twice daily  Add lidocaine  patches for shoulder and back pain -- The risks/benefits/side-effects/alternatives to this medication were discussed in detail with the patient and time was given for questions. The patient consents to medication trial.                -- Metabolic profile and EKG monitoring obtained while on an atypical antipsychotic (BMI: Lipid Panel: HbgA1c: QTc:)              -- Encouraged patient to participate in unit milieu and in scheduled group therapies        3. Medical Issues Being Addressed:   Home meds restarted. amlodipine  5 mg daily, Breo Ellipta  substituted for Symbicort , metformin  500 mg daily, rosuvastatin  20 mg daily. Consult to diabetes coordinator is placed.   4. Discharge Planning:   -- discharge tomorrow   -- Social work and case management to assist with discharge planning and identification of hospital follow-up needs prior to discharge  -- Estimated LOS: 3-4 days  Fay Hoop, PA-C 06/29/2023, 9:39 AM

## 2023-06-29 NOTE — BH IP Treatment Plan (Signed)
 Interdisciplinary Treatment and Diagnostic Plan Update  06/29/2023 Time of Session: 14:00 Caitlin Rivera MRN: [id number]  Principal Diagnosis: MDD (major depressive disorder), recurrent episode, severe (HCC)  Secondary Diagnoses: Principal Problem:   MDD (major depressive disorder), recurrent episode, severe (HCC) Active Problems:   Cocaine abuse, episodic (HCC)   Current Medications:  Current Facility-Administered Medications  Medication Dose Route Frequency Provider Last Rate Last Admin   acetaminophen  (TYLENOL ) tablet 650 mg  650 mg Oral Q6H PRN Bobbitt, Shalon E, NP   650 mg at 06/26/23 1532   albuterol  (VENTOLIN  HFA) 108 (90 Base) MCG/ACT inhaler 1-2 puff  1-2 puff Inhalation Q6H PRN Bobbitt, Shalon E, NP   2 puff at 06/28/23 0817   alum & mag hydroxide-simeth (MAALOX/MYLANTA) 200-200-20 MG/5ML suspension 30 mL  30 mL Oral Q4H PRN Bobbitt, Shalon E, NP       amLODipine  (NORVASC ) tablet 5 mg  5 mg Oral Daily Bobbitt, Shalon E, NP   5 mg at 06/29/23 1027   ARIPiprazole  (ABILIFY ) tablet 2 mg  2 mg Oral Daily Millington, Matthew E, PA-C   2 mg at 06/29/23 0825   haloperidol  (HALDOL ) tablet 5 mg  5 mg Oral TID PRN Bobbitt, Shalon E, NP       And   diphenhydrAMINE  (BENADRYL ) capsule 50 mg  50 mg Oral TID PRN Bobbitt, Shalon E, NP       haloperidol  lactate (HALDOL ) injection 5 mg  5 mg Intramuscular TID PRN Bobbitt, Shalon E, NP       And   diphenhydrAMINE  (BENADRYL ) injection 50 mg  50 mg Intramuscular TID PRN Bobbitt, Shalon E, NP       And   LORazepam  (ATIVAN ) injection 2 mg  2 mg Intramuscular TID PRN Bobbitt, Shalon E, NP       DULoxetine  (CYMBALTA ) DR capsule 40 mg  40 mg Oral BID Jadapalle, Sree, MD   40 mg at 06/29/23 0825   fluticasone  furoate-vilanterol (BREO ELLIPTA ) 200-25 MCG/ACT 1 puff  1 puff Inhalation Daily Bobbitt, Shalon E, NP   1 puff at 06/25/23 0851   gabapentin  (NEURONTIN ) capsule 700 mg  700 mg Oral BID Millington, Matthew E, PA-C   700 mg at 06/29/23 2536    lidocaine  (LIDODERM ) 5 % 2 patch  2 patch Transdermal Q24H Millington, Matthew E, PA-C   1 patch at 06/29/23 0827   magnesium  hydroxide (MILK OF MAGNESIA) suspension 30 mL  30 mL Oral Daily PRN Bobbitt, Shalon E, NP       metFORMIN  (GLUCOPHAGE ) tablet 500 mg  500 mg Oral Q breakfast Bobbitt, Shalon E, NP   500 mg at 06/29/23 0824   prazosin  (MINIPRESS ) capsule 1 mg  1 mg Oral QHS Bobbitt, Shalon E, NP   1 mg at 06/28/23 2108   rosuvastatin  (CRESTOR ) tablet 20 mg  20 mg Oral Daily Bobbitt, Shalon E, NP   20 mg at 06/29/23 0825   tiZANidine  (ZANAFLEX ) tablet 4 mg  4 mg Oral Q8H PRN Bobbitt, Shalon E, NP   4 mg at 06/29/23 6440   traZODone  (DESYREL ) tablet 100 mg  100 mg Oral QHS Jadapalle, Sree, MD   100 mg at 06/28/23 2109   traZODone  (DESYREL ) tablet 50 mg  50 mg Oral QHS PRN Bobbitt, Shalon E, NP   50 mg at 06/27/23 2102   PTA Medications: Medications Prior to Admission  Medication Sig Dispense Refill Last Dose/Taking   albuterol  (VENTOLIN  HFA) 108 (90 Base) MCG/ACT inhaler Inhale 1-2 puffs into the  lungs every 6 (six) hours as needed for wheezing or shortness of breath. 6.7 g 0    amLODipine  (NORVASC ) 5 MG tablet Take 1 tablet (5 mg total) by mouth daily. 30 tablet 0    DULoxetine  (CYMBALTA ) 20 MG capsule Take 1 capsule (20 mg total) by mouth 2 (two) times daily. 60 capsule 0    gabapentin  (NEURONTIN ) 300 MG capsule Take 2 capsules (600 mg total) by mouth 2 (two) times daily. 120 capsule 0    hydrOXYzine  (ATARAX ) 50 MG tablet Take 1 tablet (50 mg total) by mouth at bedtime. 30 tablet 0    metFORMIN  (GLUCOPHAGE ) 500 MG tablet Take 1 tablet (500 mg total) by mouth daily with breakfast. 30 tablet 0    prazosin  (MINIPRESS ) 1 MG capsule Take 1 capsule (1 mg total) by mouth at bedtime. 30 capsule 0    rosuvastatin  (CRESTOR ) 20 MG tablet Take 1 tablet (20 mg total) by mouth daily. 30 tablet 0    SYMBICORT  160-4.5 MCG/ACT inhaler Inhale 2 puffs into the lungs in the morning and at bedtime. 6 g 0     tiZANidine  (ZANAFLEX ) 4 MG tablet Take 1 tablet (4 mg total) by mouth every 8 (eight) hours as needed for muscle spasms. 30 tablet 0     Patient Stressors: Health problems   Medication change or noncompliance   Substance abuse    Patient Strengths: Ability for insight  Manufacturing systems engineer  Motivation for treatment/growth   Treatment Modalities: Medication Management, Group therapy, Case management,  1 to 1 session with clinician, Psychoeducation, Recreational therapy.   Physician Treatment Plan for Primary Diagnosis: MDD (major depressive disorder), recurrent episode, severe (HCC) Long Term Goal(s): Improvement in symptoms so as ready for discharge   Short Term Goals: Ability to identify and develop effective coping behaviors will improve Ability to maintain clinical measurements within normal limits will improve Compliance with prescribed medications will improve Ability to identify triggers associated with substance abuse/mental health issues will improve  Medication Management: Evaluate patient's response, side effects, and tolerance of medication regimen.  Therapeutic Interventions: 1 to 1 sessions, Unit Group sessions and Medication administration.  Evaluation of Outcomes: Progressing  Physician Treatment Plan for Secondary Diagnosis: Principal Problem:   MDD (major depressive disorder), recurrent episode, severe (HCC) Active Problems:   Cocaine abuse, episodic (HCC)  Long Term Goal(s): Improvement in symptoms so as ready for discharge   Short Term Goals: Ability to identify and develop effective coping behaviors will improve Ability to maintain clinical measurements within normal limits will improve Compliance with prescribed medications will improve Ability to identify triggers associated with substance abuse/mental health issues will improve     Medication Management: Evaluate patient's response, side effects, and tolerance of medication regimen.  Therapeutic  Interventions: 1 to 1 sessions, Unit Group sessions and Medication administration.  Evaluation of Outcomes: Progressing   RN Treatment Plan for Primary Diagnosis: MDD (major depressive disorder), recurrent episode, severe (HCC) Long Term Goal(s): Knowledge of disease and therapeutic regimen to maintain health will improve  Short Term Goals: Ability to remain free from injury will improve, Ability to verbalize frustration and anger appropriately will improve, Ability to demonstrate self-control, Ability to participate in decision making will improve, Ability to verbalize feelings will improve, Ability to disclose and discuss suicidal ideas, Ability to identify and develop effective coping behaviors will improve, and Compliance with prescribed medications will improve  Medication Management: RN will administer medications as ordered by provider, will assess and evaluate patient's response and provide  education to patient for prescribed medication. RN will report any adverse and/or side effects to prescribing provider.  Therapeutic Interventions: 1 on 1 counseling sessions, Psychoeducation, Medication administration, Evaluate responses to treatment, Monitor vital signs and CBGs as ordered, Perform/monitor CIWA, COWS, AIMS and Fall Risk screenings as ordered, Perform wound care treatments as ordered.  Evaluation of Outcomes: Progressing   LCSW Treatment Plan for Primary Diagnosis: MDD (major depressive disorder), recurrent episode, severe (HCC) Long Term Goal(s): Safe transition to appropriate next level of care at discharge, Engage patient in therapeutic group addressing interpersonal concerns.  Short Term Goals: Engage patient in aftercare planning with referrals and resources, Increase social support, Increase ability to appropriately verbalize feelings, Increase emotional regulation, Facilitate acceptance of mental health diagnosis and concerns, Facilitate patient progression through stages of  change regarding substance use diagnoses and concerns, Identify triggers associated with mental health/substance abuse issues, and Increase skills for wellness and recovery  Therapeutic Interventions: Assess for all discharge needs, 1 to 1 time with Social worker, Explore available resources and support systems, Assess for adequacy in community support network, Educate family and significant other(s) on suicide prevention, Complete Psychosocial Assessment, Interpersonal group therapy.  Evaluation of Outcomes: Progressing   Progress in Treatment: Attending groups: Yes. Participating in groups: No. Taking medication as prescribed: Yes. Toleration medication: Yes. Family/Significant other contact made: Yes, individual(s) contacted:  daughter, Mendel Stain.  Patient understands diagnosis: Yes. Discussing patient identified problems/goals with staff: Yes. Medical problems stabilized or resolved: Yes. Denies suicidal/homicidal ideation: Yes. Issues/concerns per patient self-inventory: No. Other: none.   New problem(s) identified: Yes, Describe:  Patient reports issues with her sleeping. Update 06/24/2023: No changes at this time. Update 06/29/23: No changes at this time.    New Short Term/Long Term Goal(s):detox, elimination of symptoms of psychosis, medication management for mood stabilization; elimination of SI thoughts; development of comprehensive mental wellness/sobriety plan.  Update 06/24/2023: No changes at this time. Update 06/29/23: No changes at this time.   Patient Goals:  To get better.  Update 06/24/2023: No changes at this time. Update 06/29/23: No changes at this time.   Discharge Plan or Barriers: CSW to assist with the development of appropriate discharge plan.   Update 06/24/2023: Pt. Needs to call [facility] on Wednesday for an interview. Applications has been faxed to [employer]. Update 06/29/23: No changes at this time.  Reason for Continuation of Hospitalization:  Aggression Anxiety Depression Hallucinations Mania Medication stabilization Suicidal ideation   Estimated Length of Stay: 1-7 days.  Update 06/24/2023: TBD Update 06/29/23: No changes at this time.  Last 3 [place] Suicide Severity Risk Score: Flowsheet Row Admission (Current) from 06/17/2023 in [facility] ED from 06/15/2023 in [facility] Emergency Department at [facility] ED from 04/21/2023 in [facility] Emergency Department at [facility]  C-SSRS RISK CATEGORY High Risk High Risk No Risk    Last PHQ 2/9 Scores:     No data to display          Scribe for Treatment Team: Randolm Butte, LCSW 06/29/2023 3:22 PM

## 2023-06-30 DIAGNOSIS — F333 Major depressive disorder, recurrent, severe with psychotic symptoms: Secondary | ICD-10-CM | POA: Diagnosis not present

## 2023-06-30 MED ORDER — DULOXETINE HCL 40 MG PO CPEP: 40.0000 mg | ORAL_CAPSULE | Freq: Two times a day (BID) | ORAL | 0 refills | Status: AC

## 2023-06-30 MED ORDER — GABAPENTIN 300 MG PO CAPS: 600.0000 mg | ORAL_CAPSULE | Freq: Two times a day (BID) | ORAL | 0 refills | Status: AC

## 2023-06-30 MED ORDER — METFORMIN HCL 500 MG PO TABS: 500.0000 mg | ORAL_TABLET | Freq: Every day | ORAL | 0 refills | Status: AC

## 2023-06-30 MED ORDER — PRAZOSIN HCL 1 MG PO CAPS: 1.0000 mg | ORAL_CAPSULE | Freq: Every day | ORAL | 0 refills | Status: AC

## 2023-06-30 MED ORDER — TRAZODONE HCL 100 MG PO TABS: 100.0000 mg | ORAL_TABLET | Freq: Every day | ORAL | 0 refills | Status: AC

## 2023-06-30 MED ORDER — AMLODIPINE BESYLATE 5 MG PO TABS: 5.0000 mg | ORAL_TABLET | Freq: Every day | ORAL | 0 refills | Status: AC

## 2023-06-30 MED ORDER — ROSUVASTATIN CALCIUM 20 MG PO TABS: 20.0000 mg | ORAL_TABLET | Freq: Every day | ORAL | 0 refills | Status: AC

## 2023-06-30 MED ORDER — ARIPIPRAZOLE 2 MG PO TABS: 2.0000 mg | ORAL_TABLET | Freq: Every day | ORAL | 0 refills | Status: AC

## 2023-06-30 NOTE — Plan of Care (Signed)
   Problem: Education: Goal: Emotional status will improve Outcome: Progressing Goal: Mental status will improve Outcome: Progressing

## 2023-06-30 NOTE — [facility] Counselor (Signed)
 CSW met with the patient to discuss discharge plans.   Patient again adamant that she will not go to shelters in other counties, stating that she does not wish to leave [facility].  Patient reports that she does not have funds for a hotel.  Patient reports that she is unsure if she can stay with family or friends, however, her cousin is providing transportation.  Patient reports plans to continue attempting to contact the [facility] and be truthful in her interview.  Patient reports that she will contact the resources on the back of her insurance card.  Shasta Deist, MSW, LCSW 06/30/2023 9:29 AM

## 2023-06-30 NOTE — Discharge Summary (Signed)
 Physician Discharge Summary Note  Patient:  Caitlin Rivera is an 68 y.o., female MRN:  [id number] DOB:  [birth year] Patient phone:  [phone] (home)  Patient address:   [address] [place] [address],    Date of Admission:  06/17/2023 Date of Discharge: 06/30/2023  Reason for Admission:  68 year old female presented voluntarily to the emergency department for evaluation of worsening depressive symptoms and suicidal ideation. The patient had a history of depression, DM, morbid obesity, hypercholesterolemia, hypertension, COPD, homelessness, and medication noncompliance. She reported intent to overdose on her medications, which she had not taken in over a week due to forgetfulness and perceived ineffectiveness. She also expressed significant psychosocial stressors, including chronic homelessness and lack of family support.  Principal Problem: MDD (major depressive disorder), recurrent episode, severe (HCC) Discharge Diagnoses: Principal Problem:   MDD (major depressive disorder), recurrent episode, severe (HCC) Active Problems:   Cocaine abuse, episodic (HCC)   Past Psychiatric History: Patient and chart review Psychiatric History:  Information collected from patient   Prev Dx/Sx: Depression Current Psych Provider: None reported Home Meds (current): Unable to recall on admission Therapy: None reported   Prior Psych Hospitalization: 2/25 [facility] Prior Self Harm: None reported Prior Violence: None reported   Family Psych History: Multiple family members with mental health history and substance use history both maternal and paternal side Family Hx suicide: None reported Social History:  Social History   Substance and Sexual Activity  Alcohol Use No     Social History   Substance and Sexual Activity  Drug Use Yes   Types: Crack cocaine   Comment: crack-last smoked 2 wks ago-07-24-19, states she uses once a month    Social History   Socioeconomic History    Marital status: Widowed    Spouse name: Not on file   Number of children: Not on file   Years of education: Not on file   Highest education level: Not on file  Occupational History   Not on file  Tobacco Use   Smoking status: Every Day    Current packs/day: 0.50    Average packs/day: 0.5 packs/day for 35.5 years (17.7 ttl pk-yrs)    Types: Cigarettes    Start date: 57   Smokeless tobacco: Never  Substance and Sexual Activity   Alcohol use: No   Drug use: Yes    Types: Crack cocaine    Comment: crack-last smoked 2 wks ago-07-24-19, states she uses once a month   Sexual activity: Not on file  Other Topics Concern   Not on file  Social History Narrative   Not on file   Social Drivers of Health   Financial Resource Strain: Not on file  Food Insecurity: Patient Declined (06/17/2023)   Hunger Vital Sign    Worried About Running Out of Food in the Last Year: Patient declined    Ran Out of Food in the Last Year: Patient declined  Transportation Needs: Unmet Transportation Needs (06/17/2023)   PRAPARE - Administrator, Civil Service (Medical): Yes    Lack of Transportation (Non-Medical): Yes  Physical Activity: Not on file  Stress: Not on file  Social Connections: Moderately Isolated (06/17/2023)   Social Connection and Isolation Panel    Frequency of Communication with Friends and Family: Twice a week    Frequency of Social Gatherings with Friends and Family: Twice a week    Attends Religious Services: 1 to 4 times per year    Active Member of [employer] or Organizations:  No    Attends Banker Meetings: Never    Marital Status: Widowed   Past Medical History:  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   COPD (chronic obstructive pulmonary disease) (HCC)    smoker   Diabetes mellitus without complication (HCC)    Fibroids    Hypertension    Personal history of radiation therapy    Smoker     Past  Surgical History:  Procedure Laterality Date   BREAST BIOPSY Bilateral 06/03/2019   BREAST BIOPSY Left 06/14/2019   BREAST EXCISIONAL BIOPSY Right 07/2019   BREAST LUMPECTOMY Left 07/2019   BREAST LUMPECTOMY WITH RADIOACTIVE SEED LOCALIZATION Bilateral 07/31/2019   Procedure: BILATERAL BREAST LUMPECTOMY WITH RADIOACTIVE SEED LOCALIZATION;  Surgeon: Curvin Deward MOULD, MD;  Location: [facility];  Service: General;  Laterality: Bilateral;   TUBAL LIGATION     TUBAL LIGATION     Family History:  Family History  Problem Relation Age of Onset   Cancer Mother     Hospital Course:  Upon admission, the patient was noted to be alert and oriented with no signs of acute psychosis. On examination, she reported feelings of hopelessness, helplessness, anhedonia, and anxiety, with passive suicidal ideation, but denied any active plans to harm herself. She endorsed auditory hallucinations at the time of admission, which were non-command and not distressing. Over the course of her hospitalization, the patient received a thorough psychiatric evaluation and stabilization. Her mood, sleep, and appetite showed gradual improvement. She was started on Cymbalta  for depression, Prazosin  for potential PTSD, Hydroxyzine  for anxiety/insomnia, and Abilify  for mild psychotic symptoms.  During the course of hospitalization, pt received daily multiple modalities of treatments consisting of Psychopharmacology, individual, group, psychoeducational, recreational, milieu therapy, including case management to coordinate pts inpatient and outpatient care and in concert with weekly treatment team meetings. Discharge planning was initiated on the day of admission to ensure a safe discharge. The presenting symptoms were closely monitored and medications were started as indicated.  Throughout her hospitalization, the patient made gradual improvements in both psychiatric and functional domains. She reported improved sleep,  decreased depression, and better engagement in activities of daily living (ADLs). However, homelessness remained a barrier to discharge. She expressed interest in connecting with social work for housing resources but declined available options such as shelters or hotel placement. The patient was compliant with medications and verbalized understanding of potential side effects and the mechanism of action of her prescribed treatments.  The patient's depression and anxiety symptoms improved over the course of the stay, with depression rated as 4-8/10 and anxiety ranging from 6-8/10. While some intermittent auditory hallucinations persisted, they were non-distressing and did not interfere with her daily functioning. The patient also voiced no intent to harm herself or others.  Detailed risk assessment is complete based on clinical exam and individual risk factors and acute suicide risk is low and acute violence risk is low.    On the day of discharge, following sustained improvement in the affect of this patient, continued report of euthymic mood, repeated denial of suicidal, homicidal and other violent ideations, adequate interaction with peers, active participation in groups while on the unit, and denial of adverse reactions from the medications, the treatment team decided that Pt was stable for discharge with scheduled mental health treatment as below.   Currently, all modifiable risk of harm to self/harm to others have been addressed and patient  is no longer appropriate for the acute inpatient setting and is able to continue treatment for mental health needs in the community with the supports as indicated below.  Patient is educated and verbalized understanding of discharge plan of care including medications, follow-up appointments, mental health resources and further crisis services in the community.  He is instructed to call 911 or present to the nearest emergency room should he experience any  decompensation in mood, disturbance of bowel or return of suicidal/homicidal ideations.  Patient verbalizes understanding of this education and agrees to this plan of care    Physical Findings: AIMS:  , ,  ,  ,    CIWA:    COWS:        Psychiatric Specialty Exam:  Presentation  General Appearance:  Casual  Eye Contact: Fair  Speech: Clear and Coherent  Speech Volume: Normal    Mood and Affect  Mood: Euthymic  Affect: Congruent   Thought Process  Thought Processes: Coherent  Descriptions of Associations:Intact  Orientation:Full (Time, Place and Person)  Thought Content:Logical  Hallucinations:Hallucinations: None  Ideas of Reference:None  Suicidal Thoughts:Suicidal Thoughts: No  Homicidal Thoughts:Homicidal Thoughts: No   Sensorium  Memory: Immediate Fair; Recent Fair  Judgment: Fair  Insight: Good   Executive Functions  Concentration: Fair  Attention Span: Fair  Recall: Good  Fund of Knowledge: Good  Language: Good   Psychomotor Activity  Psychomotor Activity: Psychomotor Activity: Normal  Musculoskeletal: Strength & Muscle Tone: within normal limits Gait & Station: normal Assets  Assets: Manufacturing systems engineer; Desire for Improvement   Sleep  Sleep: Sleep: Good    Physical Exam: Physical Exam Vitals and nursing note reviewed.  Constitutional:      Appearance: She is obese.  HENT:     Head: Atraumatic.   Eyes:     Extraocular Movements: Extraocular movements intact.   Pulmonary:     Effort: Pulmonary effort is normal.   Neurological:     Mental Status: She is alert and oriented to person, place, and time.   Psychiatric:        Mood and Affect: Mood normal.        Behavior: Behavior normal.    Review of Systems  Psychiatric/Behavioral:  Negative for hallucinations, substance abuse and suicidal ideas. The patient does not have insomnia.    Blood pressure 112/68, pulse 85, temperature 98.1 F  (36.7 C), resp. rate 20, height 5' 6 (1.676 m), weight 107.5 kg, SpO2 96%. Body mass index is 38.25 kg/m.   Social History   Tobacco Use  Smoking Status Every Day   Current packs/day: 0.50   Average packs/day: 0.5 packs/day for 35.5 years (17.7 ttl pk-yrs)   Types: Cigarettes   Start date: 1990  Smokeless Tobacco Never   Tobacco Cessation:  A prescription for an FDA-approved tobacco cessation medication was offered at discharge and the patient refused   Blood Alcohol level:  Lab Results  Component Value Date   [facility] <15 06/15/2023   ETH <10 02/10/2023    Metabolic Disorder Labs:  Lab Results  Component Value Date   HGBA1C 6.6 (H) 02/03/2023   MPG 142.72 02/03/2023   No results found for: PROLACTIN Lab Results  Component Value Date   CHOL 151 06/21/2023   TRIG 126 06/21/2023   HDL 45 06/21/2023   CHOLHDL 3.4 06/21/2023   VLDL 25 06/21/2023   LDLCALC 81 06/21/2023    See Psychiatric Specialty Exam and Suicide Risk Assessment completed by Attending Physician prior to discharge.  Discharge destination:  Other:  Patient plans to go to cousins house at time of discharge, and will see if she can stay there. She has declined all sheter resources, and reports she does not have ongoing funds for hotel, she will continue to seek placement at [facility].   Is patient on multiple antipsychotic therapies at discharge:  No   Has Patient had three or more failed trials of antipsychotic monotherapy by history:  No  Recommended Plan for Multiple Antipsychotic Therapies: NA  Discharge Instructions     Diet - low sodium heart healthy   Complete by: As directed    Increase activity slowly   Complete by: As directed       Allergies as of 06/30/2023       Reactions   Ibuprofen Itching   Morphine  And Codeine Itching        Medication List     STOP taking these medications    hydrOXYzine  50 MG tablet Commonly known as: ATARAX        TAKE these medications       Indication  albuterol  108 (90 Base) MCG/ACT inhaler Commonly known as: VENTOLIN  HFA Inhale 1-2 puffs into the lungs every 6 (six) hours as needed for wheezing or shortness of breath.    amLODipine  5 MG tablet Commonly known as: NORVASC  Take 1 tablet (5 mg total) by mouth daily.  Indication: High Blood Pressure   ARIPiprazole  2 MG tablet Commonly known as: ABILIFY  Take 1 tablet (2 mg total) by mouth daily. Start taking on: July 01, 2023  Indication: Major Depressive Disorder   DULoxetine  HCl 40 MG Cpep Take 1 capsule (40 mg total) by mouth 2 (two) times daily. What changed:  medication strength how much to take  Indication: Generalized Anxiety Disorder, Major Depressive Disorder   gabapentin  300 MG capsule Commonly known as: NEURONTIN  Take 2 capsules (600 mg total) by mouth 2 (two) times daily.  Indication: Neuropathic Pain   metFORMIN  500 MG tablet Commonly known as: GLUCOPHAGE  Take 1 tablet (500 mg total) by mouth daily with breakfast. Start taking on: July 01, 2023  Indication: Type 2 Diabetes   prazosin  1 MG capsule Commonly known as: MINIPRESS  Take 1 capsule (1 mg total) by mouth at bedtime.  Indication: Frightening Dreams   rosuvastatin  20 MG tablet Commonly known as: CRESTOR  Take 1 tablet (20 mg total) by mouth daily.  Indication: High Amount of Fats in the Blood   Symbicort  160-4.5 MCG/ACT inhaler Generic drug: budesonide-formoterol  Inhale 2 puffs into the lungs in the morning and at bedtime.    tiZANidine  4 MG tablet Commonly known as: ZANAFLEX  Take 1 tablet (4 mg total) by mouth every 8 (eight) hours as needed for muscle spasms.  Indication: Muscle Spasticity   traZODone  100 MG tablet Commonly known as: DESYREL  Take 1 tablet (100 mg total) by mouth at bedtime.  Indication: Trouble Sleeping        Follow-up [employer], [place]. Go to.   Why: Therapy and Medication management assessment is 07/11/23 at 11:00 AM.   Please  bring list of current medications, identification and Medicaid card. Contact information: [address] [place] [address] [phone]                 Follow-up recommendations:  # It is recommended to the patient to continue psychiatric medications as prescribed, after discharge from the hospital.   # It is recommended to the patient to follow up with  your outpatient psychiatric provider and PCP. # It was discussed with the patient, the impact of alcohol, drugs, tobacco have been there overall psychiatric and medical wellbeing, and total abstinence from substance use was recommended. # Prescriptions provided or sent directly to preferred pharmacy at discharge. Patient agreeable to plan. Given the opportunity to ask questions. Appears to feel comfortable with discharge.  # In the event of worsening symptoms, the patient is instructed to call the crisis hotline (988), 911 and or go to the nearest ED for appropriate evaluation and treatment of symptoms. To follow-up with primary care provider for other medical issues, concerns and or health care needs # Patient was discharged with a plan to follow up as noted above.      Signed: Donnice FORBES Right, PA-C 06/30/2023, 1:28 PM

## 2023-06-30 NOTE — Group Note (Signed)
 Recreation Therapy Group Note   Group Topic:Stress Management  Group Date: 06/30/2023 Start Time: 1100 End Time: 1200 Facilitators: Deatrice Factor, LRT, CTRS Location: Courtyard  Group Description: [employer]. LRT and patients played games of basketball, drew with chalk, and played corn hole while outside in the courtyard while getting fresh air and sunlight. Music was being played in the background. LRT and peers conversed about different games they have played before, what they do in their free time and anything else that is on their minds. LRT encouraged pts to drink water after being outside, sweating and getting their heart rate up.  Goal Area(s) Addressed: Patient will build on frustration tolerance skills. Patients will partake in a competitive play game with peers. Patients will gain knowledge of new leisure interest/hobby.    Affect/Mood: Appropriate   Participation Level: Active   Participation Quality: Independent   Behavior: Appropriate   Speech/Thought Process: Coherent   Insight: Good   Judgement: Good   Modes of Intervention: Activity   Patient Response to Interventions:  Receptive   Education Outcome:  Acknowledges education   Clinical Observations/Individualized Feedback: Richardine was active in their participation of session activities and group discussion. Pt interacted well with LRT and peers duration of session.    Plan: Continue to engage patient in RT group sessions 2-3x/week.   Deatrice Factor, LRT, CTRS 06/30/2023 1:37 PM

## 2023-06-30 NOTE — Progress Notes (Signed)
  [facility] Adult Case Management Discharge Plan :  Will you be returning to the same living situation after discharge:  Yes,  pt returning to same living situation At discharge, do you have transportation home?: Yes,  pt reports cousin is picking up. Do you have the ability to pay for your medications: Yes,  [facility] [place]  Release of information consent forms completed and in the chart;  Patient's signature needed at discharge.  Patient to Follow up at:  Follow-up Information     [facility] Go to.   Why: Therapy and Medication management assessment is 07/11/23 at 11:00 AM.   Please bring list of current medications, identification and Medicaid card. Contact information: [address] [place] [address] [phone]                 Next level of care provider has access to [facility] Link:no  Safety Planning and Suicide Prevention discussed: Yes,  SPE completed with the patient and patients daughter.      Has patient been referred to the Quitline?: Patient refused referral for treatment  Patient has been referred for addiction treatment: Yes, the patient will follow up with an outpatient provider for substance use disorder. Therapist: appointment made  [name], LCSW 06/30/2023, 9:24 AM

## 2023-06-30 NOTE — Progress Notes (Signed)
 Discharge Note:  Patient denies SI/HI/AVH at this time. Discharge instructions, AVS, prescriptions, and transition record gone over with patient. Patient agrees to comply with medication management, follow-up visit, and outpatient therapy. Patient belongings returned to patient. Patient questions and concerns addressed and answered. Pt provided with printed Rx

## 2023-06-30 NOTE — BHH Suicide Risk Assessment (Signed)
 [facility] Discharge Suicide Risk Assessment   Principal Problem: MDD (major depressive disorder), recurrent episode, severe (HCC) Discharge Diagnoses: Principal Problem:   MDD (major depressive disorder), recurrent episode, severe (HCC) Active Problems:   Cocaine abuse, episodic (HCC)   Total Time spent with patient: 1 hour  Musculoskeletal: Strength & Muscle Tone: decreased Gait & Station: unsteady Patient leans: N/A  Psychiatric Specialty Exam  Presentation  General Appearance:  Casual  Eye Contact: Fair  Speech: Clear and Coherent  Speech Volume: Normal  Handedness: Right   Mood and Affect  Mood: Euthymic  Duration of Depression Symptoms: Greater than two weeks  Affect: Congruent   Thought Process  Thought Processes: Coherent  Descriptions of Associations:Intact  Orientation:Full (Time, Place and Person)  Thought Content:Logical  History of Schizophrenia/Schizoaffective disorder:No data recorded Duration of Psychotic Symptoms:No data recorded Hallucinations:Hallucinations: None  Ideas of Reference:None  Suicidal Thoughts:Suicidal Thoughts: No  Homicidal Thoughts:Homicidal Thoughts: No   Sensorium  Memory: Immediate Fair; Recent Fair  Judgment: Fair  Insight: Good   Executive Functions  Concentration: Fair  Attention Span: Fair  Recall: Good  Fund of Knowledge: Good  Language: Good   Psychomotor Activity  Psychomotor Activity: Psychomotor Activity: Normal   Assets  Assets: Communication Skills; Desire for Improvement   Sleep  Sleep: Sleep: Good  Estimated Sleeping Duration (Last 24 Hours): 6.25-8.50 hours  Physical Exam: Physical Exam Vitals and nursing note reviewed.  Constitutional:      Appearance: She is obese.  HENT:     Head: Atraumatic.   Eyes:     Extraocular Movements: Extraocular movements intact.   Pulmonary:     Effort: Pulmonary effort is normal.   Neurological:     Mental Status:  She is alert and oriented to person, place, and time.    Review of Systems  Psychiatric/Behavioral:  Negative for depression, hallucinations, substance abuse and suicidal ideas. The patient is not nervous/anxious.    Blood pressure 112/68, pulse 85, temperature 98.1 F (36.7 C), resp. rate 20, height 5' 6 (1.676 m), weight 107.5 kg, SpO2 96%. Body mass index is 38.25 kg/m.  Mental Status Per Nursing Assessment::   On Admission:  Suicidal ideation indicated by patient  Demographic Factors:  Low socioeconomic status  Loss Factors: NA  Historical Factors: Impulsivity  Risk Reduction Factors:   Positive coping skills or problem solving skills  Continued Clinical Symptoms:  Previous Psychiatric Diagnoses and Treatments Medical Diagnoses and Treatments/Surgeries  Cognitive Features That Contribute To Risk:  None    Suicide Risk:  Minimal: No identifiable suicidal ideation.  Patients presenting with no risk factors but with morbid ruminations; may be classified as minimal risk based on the severity of the depressive symptoms   Follow-up Information     [facility] Go to.   Why: Therapy and Medication management assessment is 07/11/23 at 11:00 AM.   Please bring list of current medications, identification and Medicaid card. Contact information: [address] [place] [address] [phone]                 Plan Of Care/Follow-up recommendations:  # It is recommended to the patient to continue psychiatric medications as prescribed, after discharge from the hospital.   # It is recommended to the patient to follow up with your outpatient psychiatric provider and PCP. # It was discussed with the patient, the impact of alcohol, drugs, tobacco have been there overall psychiatric and medical wellbeing, and total abstinence from substance use was recommended. # Prescriptions  provided or sent directly to preferred pharmacy at discharge. Patient agreeable to  plan. Given the opportunity to ask questions. Appears to feel comfortable with discharge.  # In the event of worsening symptoms, the patient is instructed to call the crisis hotline (988), 911 and or go to the nearest ED for appropriate evaluation and treatment of symptoms. To follow-up with primary care provider for other medical issues, concerns and or health care needs    Fay Hoop, PA-C 06/30/2023, 12:26 PM

## 2023-06-30 NOTE — Progress Notes (Signed)
   06/30/23 0900  Psych Admission Type (Psych Patients Only)  Admission Status Voluntary  Psychosocial Assessment  Patient Complaints None  Eye Contact Fair  Facial Expression Animated  Affect Appropriate to circumstance  Speech Logical/coherent  Interaction Assertive  Motor Activity Slow  Appearance/Hygiene Unremarkable  Behavior Characteristics Cooperative  Mood Pleasant  Thought Process  Coherency WDL  Content WDL  Delusions None reported or observed  Perception WDL  Hallucination None reported or observed  Judgment Impaired  Confusion None  Danger to Self  Current suicidal ideation? Denies

## 2023-07-06 ENCOUNTER — Ambulatory Visit: Admitting: Nurse Practitioner

## 2023-07-06 NOTE — Progress Notes (Deleted)
   There were no vitals taken for this visit.   Subjective:    Patient ID: Caitlin Rivera, female    DOB: December 05, 1955, 68 y.o.   MRN: [id number]  HPI: Caitlin Rivera is a 68 y.o. female  No chief complaint on file.  Patient presents to clinic to establish care with new PCP.  Introduced to Publishing rights manager role and practice setting.  All questions answered.  Discussed provider/patient relationship and expectations.  Patient reports a history of ***. Patient denies a history of: Hypertension, Elevated Cholesterol, Diabetes, Thyroid problems, Depression, Anxiety, Neurological problems, and Abdominal problems.   Active Ambulatory Problems    Diagnosis Date Noted   Malignant neoplasm of lower-inner quadrant of left breast in female, estrogen receptor positive (HCC) 06/26/2019   Morbid obesity (HCC) 07/02/2019   Type 2 diabetes mellitus with obesity (HCC) 07/02/2019   Hypercholesterolemia 07/02/2019   Hypertension 07/02/2019   COPD exacerbation (HCC) 02/02/2023   Influenza A 02/02/2023   Suicide ideation 02/11/2023   Depression 02/11/2023   MDD (major depressive disorder), recurrent severe, without psychosis (HCC) 02/12/2023   Episodic mood disorder (HCC) 06/16/2023   Cocaine abuse, episodic (HCC) 06/16/2023   Homelessness 06/16/2023   MDD (major depressive disorder), recurrent episode, severe (HCC) 06/17/2023   Resolved Ambulatory Problems    Diagnosis Date Noted   Type II diabetes mellitus (HCC) 07/02/2019   Past Medical History:  Diagnosis Date   Asthma    Cancer (HCC) 06/2019   COPD (chronic obstructive pulmonary disease) (HCC)    Diabetes mellitus without complication (HCC)    Fibroids    Personal history of radiation therapy    Smoker    Past Surgical History:  Procedure Laterality Date   BREAST BIOPSY Bilateral 06/03/2019   BREAST BIOPSY Left 06/14/2019   BREAST EXCISIONAL BIOPSY Right 07/2019   BREAST LUMPECTOMY Left 07/2019   BREAST LUMPECTOMY WITH  RADIOACTIVE SEED LOCALIZATION Bilateral 07/31/2019   Procedure: BILATERAL BREAST LUMPECTOMY WITH RADIOACTIVE SEED LOCALIZATION;  Surgeon: Curvin Deward MOULD, MD;  Location: [facility];  Service: General;  Laterality: Bilateral;   TUBAL LIGATION     TUBAL LIGATION     Family History  Problem Relation Age of Onset   Cancer Mother      Review of Systems  Per HPI unless specifically indicated above     Objective:    There were no vitals taken for this visit.  Wt Readings from Last 3 Encounters:  06/17/23 237 lb (107.5 kg)  06/15/23 240 lb (108.9 kg)  04/21/23 260 lb (117.9 kg)    Physical Exam  Results for orders placed or performed during the hospital encounter of 06/17/23  Lipid panel   Collection Time: 06/21/23  6:22 AM  Result Value Ref Range   Cholesterol 151 0 - 200 mg/dL   Triglycerides 873 <849 mg/dL   HDL 45 >59 mg/dL   Total CHOL/HDL Ratio 3.4 RATIO   VLDL 25 0 - 40 mg/dL   LDL Cholesterol 81 0 - 99 mg/dL      Assessment & Plan:   Problem List Items Addressed This Visit   None    Follow up plan: No follow-ups on file.

## 2023-08-18 ENCOUNTER — Telehealth: Payer: Self-pay

## 2023-08-18 ENCOUNTER — Ambulatory Visit: Payer: Self-pay

## 2023-08-18 NOTE — Telephone Encounter (Signed)
 Pt called to establish as a NP. Dr. Herold is listed as her PCP on her insurance card. Pt would like like a appt sooner than currently available which is Oct 3. Pt would like a call back to schedule sooner NP appt.

## 2023-08-18 NOTE — Telephone Encounter (Signed)
 FYI Only or Action Required?: FYI only for provider.  Patient was last seen in primary care on not a pt.  Called Nurse Triage reporting Back Pain.  Symptoms began Many years ago.  Interventions attempted: Prescription medications: gabapentin .  Symptoms are: unchanged.  Triage Disposition: See PCP Within 2 Weeks  Patient/caregiver understands and will follow disposition?: yes - pt has gabapentin  prescribed, but states she no longer goes to that provider in [place]. Pt would like to establish with [facility].Pt will go to [facility].                 Copied from CRM #[id number]. Topic: Clinical - Red Word Triage >> Aug 18, 2023 11:50 AM Caitlin Rivera wrote: Red Word that prompted transfer to Nurse Triage: The patient called to establish care with Dr. Herold at [facility]. However, she advised me that she has a pinched nerve on her back that is causing her severe pain. Reason for Disposition  Back pain is a chronic symptom (recurrent or ongoing AND present > 4 weeks)  Answer Assessment - Initial Assessment Questions 1. ONSET: When did the pain begin? (e.g., minutes, hours, days)     Years and years 2. LOCATION: Where does it hurt? (upper, mid or lower back)     Lower back 3. SEVERITY: How bad is the pain?  (e.g., Scale 1-10; mild, moderate, or severe)     10/10 4. PATTERN: Is the pain constant? (e.g., yes, no; constant, intermittent)      yes 5. RADIATION: Does the pain shoot into your legs or somewhere else?     Pain shoots down right leg and across back 6. CAUSE:  What do you think is causing the back pain?      Pinched nerve 7. BACK OVERUSE:  Any recent lifting of heavy objects, strenuous work or exercise?     no 8. MEDICINES: What have you taken so far for the pain? (e.g., nothing, acetaminophen , NSAIDS)     Gabapentin  9. NEUROLOGIC SYMPTOMS: Do you have any weakness, numbness, or problems with bowel/bladder control?     No - numbness in fingertips 10. OTHER  SYMPTOMS: Do you have any other symptoms? (e.g., fever, abdomen pain, burning with urination, blood in urine)       No - feels hot a lot.  Protocols used: Back Pain-A-AH

## 2023-08-18 NOTE — Telephone Encounter (Signed)
 Called patient to get her scheduled for a new patient appt. Not able to leave a message.

## 2023-08-21 ENCOUNTER — Ambulatory Visit: Payer: Self-pay

## 2023-08-21 NOTE — Telephone Encounter (Signed)
 FYI Only or Action Required?: FYI only for provider.  Patient called to make a new patient appointment with [facility]  Called Nurse Triage reporting Back Pain.  Symptoms began several months ago.  Interventions attempted: Prescription medications: gabapentin  and Rest, hydration, or home remedies.  Symptoms are: gradually worsening.  Triage Disposition: Go to ED Now (Notify PCP)  Patient/caregiver understands and will follow disposition?: Yes  Copied from CRM [id number]. Topic: Clinical - Red Word Triage >> Aug 21, 2023 11:10 AM Precious C wrote: [facility] that prompted transfer to Nurse Triage: SEVERE PAIN  Patient called to schedule an appointment with her provider due to back pain. She stated that her pain level is 11 out of 10. During scheduling, the decision tree prompted a CRM for a red word due to the severity of her pain. Reason for Disposition  [1] SEVERE back pain (e.g., excruciating) AND [2] sudden onset AND [3] age > 60 years  Answer Assessment - Initial Assessment Questions 1. ONSET: When did the pain begin? (e.g., minutes, hours, days)     Several years 2. LOCATION: Where does it hurt? (upper, mid or lower back)     Lower back pain 3. SEVERITY: How bad is the pain?  (e.g., Scale 1-10; mild, moderate, or severe)     10 4. PATTERN: Is the pain constant? (e.g., yes, no; constant, intermittent)      constant 5. RADIATION: Does the pain shoot into your legs or somewhere else?     yes 6. CAUSE:  What do you think is causing the back pain?      Patient reports hx of pinched nerve in her back 7. BACK OVERUSE:  Any recent lifting of heavy objects, strenuous work or exercise?     no 8. MEDICINES: What have you taken so far for the pain? (e.g., nothing, acetaminophen , NSAIDS)     Gabapentin  9. NEUROLOGIC SYMPTOMS: Do you have any weakness, numbness, or problems with bowel/bladder control?     no 10. OTHER SYMPTOMS: Do you have any other symptoms? (e.g., fever,  abdomen pain, burning with urination, blood in urine)       No  Patient recommended to Emergency Department with current symptoms. Patient is scheduled for new patient appointment at [facility] for October 12, 2023  Protocols used: Back Pain-A-AH

## 2023-08-22 NOTE — Telephone Encounter (Signed)
 Appt scheduled

## 2023-10-12 ENCOUNTER — Ambulatory Visit: Admitting: Pediatrics

## 2023-12-06 ENCOUNTER — Ambulatory Visit: Admitting: Family Medicine
